# Patient Record
Sex: Male | Born: 1965
Health system: Southern US, Community
[De-identification: ages and names within clinical notes are randomized; demographics above are authoritative.]

## PROBLEM LIST (undated history)

## (undated) DIAGNOSIS — C61 Malignant neoplasm of prostate: Secondary | ICD-10-CM

## (undated) DIAGNOSIS — J449 Chronic obstructive pulmonary disease, unspecified: Secondary | ICD-10-CM

## (undated) DIAGNOSIS — I10 Essential (primary) hypertension: Secondary | ICD-10-CM

## (undated) DIAGNOSIS — F1910 Other psychoactive substance abuse, uncomplicated: Secondary | ICD-10-CM

## (undated) HISTORY — DX: Chronic obstructive pulmonary disease, unspecified: J44.9

## (undated) HISTORY — PX: NO PAST SURGERIES: SHX2092

## (undated) HISTORY — PX: PROSTATE BIOPSY: SHX241

---

## 2002-07-18 ENCOUNTER — Emergency Department (HOSPITAL_COMMUNITY): Admission: EM | Admit: 2002-07-18 | Discharge: 2002-07-18 | Payer: Self-pay | Admitting: Emergency Medicine

## 2002-07-21 ENCOUNTER — Emergency Department (HOSPITAL_COMMUNITY): Admission: EM | Admit: 2002-07-21 | Discharge: 2002-07-21 | Payer: Self-pay | Admitting: Emergency Medicine

## 2005-02-14 ENCOUNTER — Emergency Department (HOSPITAL_COMMUNITY): Admission: EM | Admit: 2005-02-14 | Discharge: 2005-02-14 | Payer: Self-pay | Admitting: Emergency Medicine

## 2005-11-03 ENCOUNTER — Emergency Department (HOSPITAL_COMMUNITY): Admission: EM | Admit: 2005-11-03 | Discharge: 2005-11-04 | Payer: Self-pay | Admitting: Emergency Medicine

## 2009-01-05 ENCOUNTER — Encounter: Admission: RE | Admit: 2009-01-05 | Discharge: 2009-01-05 | Payer: Self-pay | Admitting: Family Medicine

## 2009-11-17 ENCOUNTER — Emergency Department (HOSPITAL_COMMUNITY): Admission: EM | Admit: 2009-11-17 | Discharge: 2009-11-17 | Payer: Self-pay | Admitting: Family Medicine

## 2010-02-02 ENCOUNTER — Ambulatory Visit: Payer: Self-pay | Admitting: Internal Medicine

## 2010-03-02 ENCOUNTER — Ambulatory Visit: Payer: Self-pay | Admitting: Internal Medicine

## 2010-03-02 ENCOUNTER — Encounter (INDEPENDENT_AMBULATORY_CARE_PROVIDER_SITE_OTHER): Payer: Self-pay | Admitting: Family Medicine

## 2010-03-02 LAB — CONVERTED CEMR LAB
Albumin: 4.3 g/dL (ref 3.5–5.2)
Alkaline Phosphatase: 60 units/L (ref 39–117)
Basophils Relative: 0 % (ref 0–1)
Calcium: 9.8 mg/dL (ref 8.4–10.5)
Eosinophils Absolute: 0.1 10*3/uL (ref 0.0–0.7)
Eosinophils Relative: 2 % (ref 0–5)
Glucose, Bld: 90 mg/dL (ref 70–99)
HCT: 50.4 % (ref 39.0–52.0)
HDL: 51 mg/dL (ref >39)
Hemoglobin: 16.3 g/dL (ref 13.0–17.0)
LDL Cholesterol: 137 mg/dL — ABNORMAL HIGH (ref 0–99)
Lymphocytes Relative: 31 % (ref 12–46)
Lymphs Abs: 2.7 10*3/uL (ref 0.7–4.0)
MCHC: 32.3 g/dL (ref 30.0–36.0)
MCV: 93.7 fL (ref 78.0–100.0)
Monocytes Absolute: 0.8 10*3/uL (ref 0.1–1.0)
Monocytes Relative: 9 % (ref 3–12)
Neutrophils Relative %: 59 % (ref 43–77)
Platelets: 274 10*3/uL (ref 150–400)
Potassium: 4.2 meq/L (ref 3.5–5.3)
RDW: 13.9 % (ref 11.5–15.5)
TSH: 0.869 microintl units/mL (ref 0.350–4.500)
Triglycerides: 149 mg/dL (ref <150)

## 2010-05-30 ENCOUNTER — Ambulatory Visit: Payer: Self-pay | Admitting: Internal Medicine

## 2013-06-24 ENCOUNTER — Ambulatory Visit
Admission: RE | Admit: 2013-06-24 | Discharge: 2013-06-24 | Disposition: A | Discharge status: Home / Self Care | Payer: No Typology Code available for payment source | Source: Ambulatory Visit | Attending: Family Medicine | Admitting: Family Medicine

## 2013-06-24 ENCOUNTER — Other Ambulatory Visit: Payer: Self-pay | Admitting: Family Medicine

## 2013-06-24 DIAGNOSIS — I10 Essential (primary) hypertension: Secondary | ICD-10-CM

## 2014-03-05 ENCOUNTER — Other Ambulatory Visit (HOSPITAL_COMMUNITY): Payer: Self-pay

## 2014-03-05 DIAGNOSIS — R0689 Other abnormalities of breathing: Secondary | ICD-10-CM

## 2014-03-10 ENCOUNTER — Encounter (HOSPITAL_COMMUNITY): Payer: Self-pay

## 2014-05-12 ENCOUNTER — Ambulatory Visit (HOSPITAL_COMMUNITY)
Admission: RE | Admit: 2014-05-12 | Discharge: 2014-05-12 | Disposition: A | Discharge status: Home / Self Care | Payer: BC Managed Care – PPO | Source: Ambulatory Visit | Attending: Internal Medicine | Admitting: Internal Medicine

## 2014-05-12 DIAGNOSIS — Z09 Encounter for follow-up examination after completed treatment for conditions other than malignant neoplasm: Secondary | ICD-10-CM | POA: Insufficient documentation

## 2014-05-12 MED ORDER — ALBUTEROL SULFATE (2.5 MG/3ML) 0.083% IN NEBU
2.5000 mg | INHALATION_SOLUTION | Freq: Once | RESPIRATORY_TRACT | Status: AC
  Administered 2014-05-12: 2.5 mg via RESPIRATORY_TRACT

## 2014-05-13 LAB — PULMONARY FUNCTION TEST
DL/VA % pred: 100 %
DL/VA: 4.74 ml/min/mmHg/L
DLCO COR: 38.07 ml/min/mmHg
DLCO UNC % PRED: 113 %
DLCO UNC: 38.07 ml/min/mmHg
DLCO cor % pred: 113 %
FEF 25-75 POST: 3.21 L/s
FEF 25-75 PRE: 2.51 L/s
FEF2575-%CHANGE-POST: 27 %
FEF2575-%PRED-POST: 87 %
FEF2575-%Pred-Pre: 68 %
FEV1-%Change-Post: 8 %
FEV1-%Pred-Post: 90 %
FEV1-%Pred-Pre: 83 %
FEV1-POST: 3.74 L
FEV1-Pre: 3.45 L
FEV1FVC-%CHANGE-POST: 0 %
FEV1FVC-%PRED-PRE: 87 %
FEV6-%CHANGE-POST: 8 %
FEV6-%PRED-POST: 105 %
FEV6-%PRED-PRE: 97 %
FEV6-POST: 5.41 L
FEV6-PRE: 4.97 L
FEV6FVC-%Change-Post: 1 %
FEV6FVC-%Pred-Post: 102 %
FEV6FVC-%Pred-Pre: 101 %
FVC-%Change-Post: 7 %
FVC-%PRED-POST: 102 %
FVC-%Pred-Pre: 95 %
FVC-POST: 5.43 L
FVC-Pre: 5.04 L
POST FEV1/FVC RATIO: 69 %
POST FEV6/FVC RATIO: 100 %
PRE FEV1/FVC RATIO: 68 %
Pre FEV6/FVC Ratio: 99 %
RV % PRED: 128 %
RV: 2.63 L
TLC % PRED: 112 %
TLC: 8.07 L

## 2014-05-27 ENCOUNTER — Other Ambulatory Visit: Payer: Self-pay | Admitting: Urology

## 2014-07-01 ENCOUNTER — Encounter (HOSPITAL_COMMUNITY): Payer: Self-pay

## 2014-07-01 ENCOUNTER — Ambulatory Visit (HOSPITAL_COMMUNITY): Admit: 2014-07-01 | Payer: BC Managed Care – PPO | Admitting: Urology

## 2014-07-01 SURGERY — ROBOTIC ASSISTED LAPAROSCOPIC RADICAL PROSTATECTOMY
Anesthesia: General

## 2015-03-08 ENCOUNTER — Emergency Department (HOSPITAL_COMMUNITY)
Admission: EM | Admit: 2015-03-08 | Discharge: 2015-03-08 | Disposition: A | Discharge status: Home / Self Care | Payer: Self-pay | Attending: Emergency Medicine | Admitting: Emergency Medicine

## 2015-03-08 ENCOUNTER — Encounter (HOSPITAL_COMMUNITY): Payer: Self-pay | Admitting: Emergency Medicine

## 2015-03-08 DIAGNOSIS — T7840XA Allergy, unspecified, initial encounter: Secondary | ICD-10-CM

## 2015-03-08 DIAGNOSIS — R220 Localized swelling, mass and lump, head: Secondary | ICD-10-CM | POA: Insufficient documentation

## 2015-03-08 DIAGNOSIS — I10 Essential (primary) hypertension: Secondary | ICD-10-CM | POA: Insufficient documentation

## 2015-03-08 DIAGNOSIS — T781XXA Other adverse food reactions, not elsewhere classified, initial encounter: Secondary | ICD-10-CM | POA: Insufficient documentation

## 2015-03-08 DIAGNOSIS — Y9389 Activity, other specified: Secondary | ICD-10-CM | POA: Insufficient documentation

## 2015-03-08 DIAGNOSIS — R062 Wheezing: Secondary | ICD-10-CM | POA: Insufficient documentation

## 2015-03-08 DIAGNOSIS — X58XXXA Exposure to other specified factors, initial encounter: Secondary | ICD-10-CM | POA: Insufficient documentation

## 2015-03-08 DIAGNOSIS — Y9289 Other specified places as the place of occurrence of the external cause: Secondary | ICD-10-CM | POA: Insufficient documentation

## 2015-03-08 DIAGNOSIS — Y998 Other external cause status: Secondary | ICD-10-CM | POA: Insufficient documentation

## 2015-03-08 DIAGNOSIS — Z720 Tobacco use: Secondary | ICD-10-CM | POA: Insufficient documentation

## 2015-03-08 HISTORY — DX: Essential (primary) hypertension: I10

## 2015-03-08 LAB — CBC WITH DIFFERENTIAL/PLATELET
BASOS ABS: 0 10*3/uL (ref 0.0–0.1)
BASOS PCT: 0 % (ref 0–1)
EOS ABS: 0.1 10*3/uL (ref 0.0–0.7)
Eosinophils Relative: 1 % (ref 0–5)
HCT: 49.6 % (ref 39.0–52.0)
Hemoglobin: 16.8 g/dL (ref 13.0–17.0)
LYMPHS ABS: 2.8 10*3/uL (ref 0.7–4.0)
Lymphocytes Relative: 28 % (ref 12–46)
MCH: 30.5 pg (ref 26.0–34.0)
MCHC: 33.9 g/dL (ref 30.0–36.0)
MCV: 90 fL (ref 78.0–100.0)
Monocytes Absolute: 1.3 10*3/uL — ABNORMAL HIGH (ref 0.1–1.0)
Monocytes Relative: 12 % (ref 3–12)
NEUTROS ABS: 5.9 10*3/uL (ref 1.7–7.7)
NEUTROS PCT: 59 % (ref 43–77)
PLATELETS: 277 10*3/uL (ref 150–400)
RBC: 5.51 MIL/uL (ref 4.22–5.81)
RDW: 12.9 % (ref 11.5–15.5)
WBC: 10.1 10*3/uL (ref 4.0–10.5)

## 2015-03-08 LAB — BASIC METABOLIC PANEL
ANION GAP: 9 (ref 5–15)
BUN: 10 mg/dL (ref 6–20)
CO2: 22 mmol/L (ref 22–32)
CREATININE: 1.21 mg/dL (ref 0.61–1.24)
Calcium: 9.4 mg/dL (ref 8.9–10.3)
Chloride: 104 mmol/L (ref 101–111)
GFR calc Af Amer: >60 mL/min (ref >60)
GFR calc non Af Amer: >60 mL/min (ref >60)
GLUCOSE: 103 mg/dL — AB (ref 70–99)
POTASSIUM: 3.6 mmol/L (ref 3.5–5.1)
SODIUM: 135 mmol/L (ref 135–145)

## 2015-03-08 MED ORDER — PREDNISONE 50 MG PO TABS: ORAL_TABLET | ORAL | Status: DC

## 2015-03-08 MED ORDER — METHYLPREDNISOLONE SODIUM SUCC 125 MG IJ SOLR
125.0000 mg | Freq: Once | INTRAMUSCULAR | Status: AC
  Administered 2015-03-08: 125 mg via INTRAVENOUS
  Filled 2015-03-08: qty 2

## 2015-03-08 MED ORDER — EPINEPHRINE 0.3 MG/0.3ML IJ SOAJ: 0.3000 mg | Freq: Once | INTRAMUSCULAR | Status: DC | PRN

## 2015-03-08 MED ORDER — FAMOTIDINE 20 MG PO TABS: 20.0000 mg | ORAL_TABLET | Freq: Two times a day (BID) | ORAL | Status: DC

## 2015-03-08 MED ORDER — EPINEPHRINE 0.3 MG/0.3ML IJ SOAJ
0.3000 mg | Freq: Once | INTRAMUSCULAR | Status: AC
  Administered 2015-03-08: 0.3 mg via INTRAMUSCULAR
  Filled 2015-03-08: qty 0.3

## 2015-03-08 MED ORDER — DIPHENHYDRAMINE HCL 25 MG PO TABS: 25.0000 mg | ORAL_TABLET | Freq: Four times a day (QID) | ORAL | Status: DC

## 2015-03-08 MED ORDER — DIPHENHYDRAMINE HCL 50 MG/ML IJ SOLN
25.0000 mg | Freq: Once | INTRAMUSCULAR | Status: AC
  Administered 2015-03-08: 25 mg via INTRAVENOUS
  Filled 2015-03-08: qty 1

## 2015-03-08 MED ORDER — LIDOCAINE-EPINEPHRINE 2 %-1:100000 IJ SOLN: 20.0000 mL | Freq: Once | INTRAMUSCULAR | Status: DC

## 2015-03-08 MED ORDER — FAMOTIDINE IN NACL 20-0.9 MG/50ML-% IV SOLN
20.0000 mg | Freq: Once | INTRAVENOUS | Status: AC
  Administered 2015-03-08: 20 mg via INTRAVENOUS
  Filled 2015-03-08: qty 50

## 2015-03-08 NOTE — Discharge Instructions (Signed)
Read the information below.  Use the prescribed medication as directed.  Please discuss all new medications with your pharmacist.  You may return to the Emergency Department at any time for worsening condition or any new symptoms that concern you.   If you develop high fevers, difficulty swallowing or breathing, or you are unable to tolerate fluids by mouth, return to the ER immediately for a recheck.     STOP taking your blood pressure medication called Lisinopril.  Monitor your blood pressure closely and follow up with your primary care provider for a recheck.      Allergies Allergies may happen from anything your body is sensitive to. This may be food, medicines, pollens, chemicals, and nearly anything around you in everyday life that produces allergens. An allergen is anything that causes an allergy producing substance. Heredity is often a factor in causing these problems. This means you may have some of the same allergies as your parents. Food allergies happen in all age groups. Food allergies are some of the most severe and life threatening. Some common food allergies are cow's milk, seafood, eggs, nuts, wheat, and soybeans. SYMPTOMS   Swelling around the mouth.  An itchy red rash or hives.  Vomiting or diarrhea.  Difficulty breathing. SEVERE ALLERGIC REACTIONS ARE LIFE-THREATENING. This reaction is called anaphylaxis. It can cause the mouth and throat to swell and cause difficulty with breathing and swallowing. In severe reactions only a trace amount of food (for example, peanut oil in a salad) may cause death within seconds. Seasonal allergies occur in all age groups. These are seasonal because they usually occur during the same season every year. They may be a reaction to molds, grass pollens, or tree pollens. Other causes of problems are house dust mite allergens, pet dander, and mold spores. The symptoms often consist of nasal congestion, a runny itchy nose associated with sneezing,  and tearing itchy eyes. There is often an associated itching of the mouth and ears. The problems happen when you come in contact with pollens and other allergens. Allergens are the particles in the air that the body reacts to with an allergic reaction. This causes you to release allergic antibodies. Through a chain of events, these eventually cause you to release histamine into the blood stream. Although it is meant to be protective to the body, it is this release that causes your discomfort. This is why you were given anti-histamines to feel better. If you are unable to pinpoint the offending allergen, it may be determined by skin or blood testing. Allergies cannot be cured but can be controlled with medicine. Hay fever is a collection of all or some of the seasonal allergy problems. It may often be treated with simple over-the-counter medicine such as diphenhydramine. Take medicine as directed. Do not drink alcohol or drive while taking this medicine. Check with your caregiver or package insert for child dosages. If these medicines are not effective, there are many new medicines your caregiver can prescribe. Stronger medicine such as nasal spray, eye drops, and corticosteroids may be used if the first things you try do not work well. Other treatments such as immunotherapy or desensitizing injections can be used if all else fails. Follow up with your caregiver if problems continue. These seasonal allergies are usually not life threatening. They are generally more of a nuisance that can often be handled using medicine. HOME CARE INSTRUCTIONS   If unsure what causes a reaction, keep a diary of foods eaten and symptoms that  follow. Avoid foods that cause reactions.  If hives or rash are present:  Take medicine as directed.  You may use an over-the-counter antihistamine (diphenhydramine) for hives and itching as needed.  Apply cold compresses (cloths) to the skin or take baths in cool water. Avoid hot  baths or showers. Heat will make a rash and itching worse.  If you are severely allergic:  Following a treatment for a severe reaction, hospitalization is often required for closer follow-up.  Wear a medic-alert bracelet or necklace stating the allergy.  You and your family must learn how to give adrenaline or use an anaphylaxis kit.  If you have had a severe reaction, always carry your anaphylaxis kit or EpiPen with you. Use this medicine as directed by your caregiver if a severe reaction is occurring. Failure to do so could have a fatal outcome. SEEK MEDICAL CARE IF:  You suspect a food allergy. Symptoms generally happen within 30 minutes of eating a food.  Your symptoms have not gone away within 2 days or are getting worse.  You develop new symptoms.  You want to retest yourself or your child with a food or drink you think causes an allergic reaction. Never do this if an anaphylactic reaction to that food or drink has happened before. Only do this under the care of a caregiver. SEEK IMMEDIATE MEDICAL CARE IF:   You have difficulty breathing, are wheezing, or have a tight feeling in your chest or throat.  You have a swollen mouth, or you have hives, swelling, or itching all over your body.  You have had a severe reaction that has responded to your anaphylaxis kit or an EpiPen. These reactions may return when the medicine has worn off. These reactions should be considered life threatening. MAKE SURE YOU:   Understand these instructions.  Will watch your condition.  Will get help right away if you are not doing well or get worse. Document Released: 01/09/2003 Document Revised: 02/10/2013 Document Reviewed: 06/15/2008 [facility] Patient Information 2015 [place], [place]. This information is not intended to replace advice given to you by your health care provider. Make sure you discuss any questions you have with your health care provider.

## 2015-03-08 NOTE — ED Provider Notes (Signed)
49 year old male, currently taking antihypertensive medications, presents with acute onset of urticaria, itching and tongue swelling that occurred this morning. He is unsure what the allergic exposure was, he is unsure if he is on an ACE inhibitor, he has had a possible reaction to seafood, possible reaction to antibiotic in the past though he is unsure exactly which medicines or foods it was. On exam the patient has no more urticaria after he was given intramuscular epinephrine, he has a slight amount of tongue angioedema, no tongue extrusion, no lip swelling, no change in phonation, normal lung sounds. No obvious rash on exam. Will obtain pharmacy medication specifics, continue to treat acute allergic reaction, less likely to be ACE inhibitor as this appears to be histamine related with itching and urticaria. The patient will definitely need outpatient allergy testing.  Medical screening examination/treatment/procedure(s) were conducted as a shared visit with non-physician practitioner(s) and myself.  I personally evaluated the patient during the encounter.  Clinical Impression:   Final diagnoses:  Allergic reaction, initial encounter         Noemi Chapel, MD 03/09/15 [phone]

## 2015-03-08 NOTE — ED Provider Notes (Signed)
CSN: [id number]     Arrival date & time 03/08/15  9379 History   First MD Initiated Contact with Patient 03/08/15 [phone]     Chief Complaint  Patient presents with  . Allergic Reaction     (Consider location/radiation/quality/duration/timing/severity/associated sxs/prior Treatment) The history is provided by the patient.     Patient presents with diffuse itching of the hands and body, tongue swelling, mild edema around the eyes that began approximately 1 hour prior to arrival.  Is having to work to swallow but denies SOB.  States he "stays congested" in his lungs and admits to smoking.  Has no known allergies but has had allergic reaction in the past to dental medication that he took on the same day he ate seafood.  He did eat at someone else's house yesterday and had foods that were abnormal for him including quiche, lasagna - no known seafood in the food.  Denies allergy to eggs.    Past Medical History  Diagnosis Date  . Hypertension    History reviewed. No pertinent past surgical history. History reviewed. No pertinent family history. History  Substance Use Topics  . Smoking status: Current Every Day Smoker -- 1.00 packs/day    Types: Cigarettes  . Smokeless tobacco: Not on file  . Alcohol Use: Yes     Comment: 6 beer/daqy    Review of Systems  All other systems reviewed and are negative.     Allergies  Review of patient's allergies indicates no known allergies.  Home Medications   Prior to Admission medications   Not on File   BP 137/85 mmHg  Pulse 81  Temp(Src) 98.3 F (36.8 C) (Oral)  Resp 18  Ht 6\' 1"  (1.854 m)  Wt 230 lb (104.327 kg)  BMI 30.35 kg/m2  SpO2 95% Physical Exam  Constitutional: He appears well-developed and well-nourished. No distress.  HENT:  Head: Normocephalic and atraumatic.  +tongue swelling.  Oropharynx patent.  No labial swelling.  Mild edema around left eye.    Neck: Neck supple.  Cardiovascular: Normal rate and regular rhythm.    Pulmonary/Chest: Effort normal. No respiratory distress. He has wheezes. He has no rales.  Abdominal: Soft.  Neurological: He is alert. He exhibits normal muscle tone.  Skin: He is not diaphoretic.  Redness over dorsal hands.   Nursing note and vitals reviewed.   ED Course  Procedures (including critical care time) Labs Review Labs Reviewed  BASIC METABOLIC PANEL - Abnormal; Notable for the following:    Glucose, Bld 103 (*)    All other components within normal limits  CBC WITH DIFFERENTIAL/PLATELET - Abnormal; Notable for the following:    Monocytes Absolute 1.3 (*)    All other components within normal limits    Imaging Review No results found.   EKG Interpretation None       9:55 AM Allergic reaction vs angioedema.  Tongue swelling, working to swallow.  Some mild wheezing on exam, denies SOB.  Pt on blood pressure medications, multiple, unknown names.  Exposure to new/different foods yesterday.  Has had allergic reaction previously to unknown dental medications and/or seafood years ago.    10:10 AM Dr Sabra Heck made aware of patient.    11:10 AM Pt feeling much better, improved swelling of tongue, no airway concerns at present.  Will continue to monitor.   12:41 PM Pt reports he is back to baseline, no swelling.    MDM   Final diagnoses:  Allergic reaction, initial encounter   Afebrile,  nontoxic patient with diffuse itching followed by tongue swelling.  IM epinephrine, IV solumedrol, benadryl, solumedrol given with great improvement.  Pt monitored >4 hours is complete resolution of symptoms and no rebound.  Unclear if angioedema or more likely allergic reaction given the itching prior to tongue swelling.   D/C home with allergist follow up, d/c lisinopril, add prednisone, pepcid, benadryl, epipen.  Discussed use of epipen and need for 911/ER visit following use.  Discussed result, findings, treatment, and follow up  with patient.  Pt given return precautions.  Pt  verbalizes understanding and agrees with plan.         Clayton Bibles, PA-C 03/08/15 1557  Noemi Chapel, MD 03/09/15 [phone]

## 2015-03-08 NOTE — ED Notes (Signed)
Pt reports noting itching to hands and tounge swelling beginning this AM. Pt slurring speech. Airway intact.

## 2015-03-08 NOTE — ED Notes (Signed)
Pt complaining of pain at IV site, removed by this RN per pt request.

## 2017-03-05 ENCOUNTER — Encounter (HOSPITAL_COMMUNITY): Payer: Self-pay | Admitting: Emergency Medicine

## 2017-03-05 ENCOUNTER — Emergency Department (HOSPITAL_COMMUNITY)
Admission: EM | Admit: 2017-03-05 | Discharge: 2017-03-06 | Discharge status: Against Medical Advice | Payer: Self-pay | Attending: Emergency Medicine | Admitting: Emergency Medicine

## 2017-03-05 ENCOUNTER — Emergency Department (HOSPITAL_COMMUNITY): Payer: Self-pay

## 2017-03-05 DIAGNOSIS — F17210 Nicotine dependence, cigarettes, uncomplicated: Secondary | ICD-10-CM | POA: Insufficient documentation

## 2017-03-05 DIAGNOSIS — F1910 Other psychoactive substance abuse, uncomplicated: Secondary | ICD-10-CM

## 2017-03-05 DIAGNOSIS — R791 Abnormal coagulation profile: Secondary | ICD-10-CM | POA: Insufficient documentation

## 2017-03-05 DIAGNOSIS — Z79899 Other long term (current) drug therapy: Secondary | ICD-10-CM | POA: Insufficient documentation

## 2017-03-05 DIAGNOSIS — R509 Fever, unspecified: Secondary | ICD-10-CM

## 2017-03-05 DIAGNOSIS — M6282 Rhabdomyolysis: Secondary | ICD-10-CM

## 2017-03-05 DIAGNOSIS — R4182 Altered mental status, unspecified: Secondary | ICD-10-CM | POA: Insufficient documentation

## 2017-03-05 DIAGNOSIS — I10 Essential (primary) hypertension: Secondary | ICD-10-CM | POA: Insufficient documentation

## 2017-03-05 LAB — CBC WITH DIFFERENTIAL/PLATELET
Basophils Absolute: 0 10*3/uL (ref 0.0–0.1)
Basophils Relative: 0 %
Eosinophils Absolute: 0 10*3/uL (ref 0.0–0.7)
Eosinophils Relative: 0 %
HEMATOCRIT: 46.9 % (ref 39.0–52.0)
HEMOGLOBIN: 15.7 g/dL (ref 13.0–17.0)
LYMPHS PCT: 11 %
Lymphs Abs: 2.2 10*3/uL (ref 0.7–4.0)
MCH: 30.3 pg (ref 26.0–34.0)
MCHC: 33.5 g/dL (ref 30.0–36.0)
MCV: 90.4 fL (ref 78.0–100.0)
MONO ABS: 1.2 10*3/uL — AB (ref 0.1–1.0)
MONOS PCT: 6 %
NEUTROS ABS: 17 10*3/uL — AB (ref 1.7–7.7)
NEUTROS PCT: 83 %
Platelets: 269 10*3/uL (ref 150–400)
RBC: 5.19 MIL/uL (ref 4.22–5.81)
RDW: 13.9 % (ref 11.5–15.5)
WBC: 20.5 10*3/uL — ABNORMAL HIGH (ref 4.0–10.5)

## 2017-03-05 LAB — I-STAT TROPONIN, ED: TROPONIN I, POC: 0.04 ng/mL (ref 0.00–0.08)

## 2017-03-05 LAB — PROTIME-INR
INR: 1.15
Prothrombin Time: 14.8 seconds (ref 11.4–15.2)

## 2017-03-05 MED ORDER — SODIUM CHLORIDE 0.9 % IV BOLUS (SEPSIS)
1000.0000 mL | Freq: Once | INTRAVENOUS | Status: AC
  Administered 2017-03-06: 1000 mL via INTRAVENOUS

## 2017-03-05 NOTE — ED Notes (Signed)
Pt sleeping in room unresponsive.  Pt's VSS.  No attempt to arouse pt due to his previous violent behavior.

## 2017-03-05 NOTE — ED Notes (Signed)
Patient transported to CT 

## 2017-03-05 NOTE — ED Triage Notes (Signed)
Per EMS:   Pt went to a party and drove back to his house.  Pt then smoke some substance and started to become aggressive and acting altered to family. Family called EMS, pt was then aggressive to EMS.  Pt hit arm against pole and showed no sign of feeling pain.  Pt given 5 of Haldol and 5 of Versed.  Pt currently sedated.   VSS

## 2017-03-05 NOTE — ED Provider Notes (Signed)
[facility] DEPT Provider Note   CSN: [id number] Arrival date & time: 03/05/17  2219 By signing my name below, I, Dyke Brackett, attest that this documentation has been prepared under the direction and in the presence of Danicka Hourihan, Annie Main, MD . Electronically Signed: Dyke Brackett, Scribe. 03/05/2017. 11:15 PM.   History   Chief Complaint Chief Complaint  Patient presents with  . Altered Mental Status  LEVEL 5 CAVEAT DUE TO ALTERED MENTAL STATUS   HPI Roy Rosales is a 51 y.o. male with a history of HTN who presents to the Emergency for evaluation of altered mental status. Per EMS: pt went to a party this evening and then drove to his home. He then smoked an unknown substace and started acting aggressively towards his family who in turn called EMS. Pt then became aggressive towards them. Pt was given Haldol and Versed PTA. Pt is currently sedated.   Recheck 1 am Pt states "someone gave me some PCP" which he smoked. Pt denies any cardiac or lung history. He denies any HA, neck pain, or any other pain. No recent illness.  Pt also endorses marijuana use tonight. He denies any regular alcohol use or any IV drug use.  The history is provided by the EMS personnel and medical records. No language interpreter was used.   Past Medical History:  Diagnosis Date  . Hypertension     There are no active problems to display for this patient.   History reviewed. No pertinent surgical history.   Home Medications    Prior to Admission medications   Medication Sig Start Date End Date Taking? Authorizing Provider  atenolol (TENORMIN) 100 MG tablet Take 100 mg by mouth daily.    [provider]  diphenhydrAMINE (BENADRYL) 25 MG tablet Take 1 tablet (25 mg total) by mouth every 6 (six) hours. X 3 days then PRN allergic reaction/itching 03/08/15   West, Emily, PA-C  EPINEPHrine 0.3 mg/0.3 mL IJ SOAJ injection Inject 0.3 mLs (0.3 mg total) into the muscle once as needed (severe allergic  reaction, difficulty swallowing or breathing). 03/08/15   Clayton Bibles, PA-C  famotidine (PEPCID) 20 MG tablet Take 1 tablet (20 mg total) by mouth 2 (two) times daily. X 3 day then PRN allergic reaction 03/08/15   West, Emily, PA-C  fenofibrate (TRICOR) 48 MG tablet Take 48 mg by mouth daily.    [provider]  potassium chloride SA (K-DUR,KLOR-CON) 20 MEQ tablet Take 20 mEq by mouth daily.    [provider]  pravastatin (PRAVACHOL) 20 MG tablet Take 20 mg by mouth daily.    [provider]  predniSONE (DELTASONE) 50 MG tablet Take 1 tab PO daily 03/08/15   West, Emily, PA-C  triamterene-hydrochlorothiazide (MAXZIDE) 75-50 MG per tablet Take 1 tablet by mouth daily.    [provider]    Family History No family history on file.  Social History Social History  Substance Use Topics  . Smoking status: Current Every Day Smoker    Packs/day: 1.00    Types: Cigarettes  . Smokeless tobacco: Never Used  . Alcohol use Yes     Comment: 6 beer/daqy     Allergies   Patient has no known allergies.   Review of Systems Review of Systems  Unable to perform ROS: Mental status change   Physical Exam Updated Vital Signs BP 133/86   Pulse 98   Temp (!) 101.1 F (38.4 C) (Rectal)   Resp 17   Ht 6\' 2"  (1.88  m)   Wt 250 lb (113.4 kg)   SpO2 98%   BMI 32.10 kg/m   Physical Exam  Constitutional: He is oriented to person, place, and time. He appears well-developed and well-nourished. No distress. He is sedated.  Obtunded and chemically sedated. no obvious trauma  HENT:  Head: Normocephalic and atraumatic.  Mouth/Throat: Oropharynx is clear and moist. No oropharyngeal exudate.  Has a gag, protects airway.   Eyes: Conjunctivae and EOM are normal.  Pinpoint pupils  Neck: Normal range of motion. Neck supple.  No meningismus.  Cardiovascular: Normal rate, regular rhythm, normal heart sounds and intact distal pulses.   No murmur heard. Pulmonary/Chest:  Effort normal and breath sounds normal. No respiratory distress.  Abdominal: Soft. There is no tenderness. There is no rebound and no guarding.  Musculoskeletal: Normal range of motion. He exhibits no edema or tenderness.  Neurological: He is alert and oriented to person, place, and time. No cranial nerve deficit. He exhibits normal muscle tone. Coordination normal.  Responds to pain only. Moves all extremities spontaneously   Skin: Skin is warm. He is diaphoretic.  Psychiatric: He has a normal mood and affect. His behavior is normal.  Nursing note and vitals reviewed.   ED Treatments / Results  DIAGNOSTIC STUDIES:  Oxygen Saturation is 96% on RA, normal by my interpretation.     Labs (all labs ordered are listed, but only abnormal results are displayed) Labs Reviewed  CBC WITH DIFFERENTIAL/PLATELET - Abnormal; Notable for the following:       Result Value   WBC 20.5 (*)    Neutro Abs 17.0 (*)    Monocytes Absolute 1.2 (*)    All other components within normal limits  COMPREHENSIVE METABOLIC PANEL - Abnormal; Notable for the following:    Potassium 3.2 (*)    CO2 21 (*)    Glucose, Bld 126 (*)    Creatinine, Ser 1.45 (*)    AST 74 (*)    GFR calc non Af Amer 55 (*)    All other components within normal limits  CK - Abnormal; Notable for the following:    Total CK 2,858 (*)    All other components within normal limits  ACETAMINOPHEN LEVEL - Abnormal; Notable for the following:    Acetaminophen (Tylenol), Serum <10 (*)    All other components within normal limits  RAPID URINE DRUG SCREEN, HOSP PERFORMED - Abnormal; Notable for the following:    Benzodiazepines POSITIVE (*)    Tetrahydrocannabinol POSITIVE (*)    All other components within normal limits  TROPONIN I - Abnormal; Notable for the following:    Troponin I 0.06 (*)    All other components within normal limits  BASIC METABOLIC PANEL - Abnormal; Notable for the following:    Potassium 3.3 (*)    Calcium 7.9 (*)     All other components within normal limits  TROPONIN I - Abnormal; Notable for the following:    Troponin I 0.05 (*)    All other components within normal limits  CK - Abnormal; Notable for the following:    Total CK 3,174 (*)    All other components within normal limits  CULTURE, BLOOD (ROUTINE X 2)  CULTURE, BLOOD (ROUTINE X 2)  URINE CULTURE  URINALYSIS, ROUTINE W REFLEX MICROSCOPIC  ETHANOL  SALICYLATE LEVEL  TSH  AMMONIA  PROTIME-INR  I-STAT CG4 LACTIC ACID, ED  I-STAT TROPOININ, ED  I-STAT CG4 LACTIC ACID, ED    EKG  EKG Interpretation  Date/Time:  Monday Mar 05 2017 22:41:42 EDT Ventricular Rate:  99 PR Interval:    QRS Duration: 77 QT Interval:  355 QTC Calculation: 456 R Axis:   42 Text Interpretation:  Sinus rhythm Posterior infarct, old Baseline wander in lead(s) II III aVF similar ST elevation in 2016 Confirmed by Wyvonnia Dusky  MD, [facility] [phone]) on 03/05/2017 11:26:42 PM       Radiology Dg Chest 1 View  Result Date: 03/06/2017 CLINICAL DATA:  Acute onset of altered mental status. Patient became aggressive. Initial encounter. EXAM: CHEST 1 VIEW COMPARISON:  Chest radiograph performed 06/24/2013 FINDINGS: The lungs are well-aerated and clear. There is no evidence of focal opacification, pleural effusion or pneumothorax. The cardiomediastinal silhouette is within normal limits. No acute osseous abnormalities are seen. IMPRESSION: No acute cardiopulmonary process seen. Electronically Signed   By: Garald Balding M.D.   On: 03/06/2017 00:01   Ct Head Wo Contrast  Result Date: 03/05/2017 CLINICAL DATA:  Altered mental status. EXAM: CT HEAD WITHOUT CONTRAST TECHNIQUE: Contiguous axial images were obtained from the base of the skull through the vertex without intravenous contrast. COMPARISON:  None. FINDINGS: Brain: No evidence of acute infarction, hemorrhage, hydrocephalus, extra-axial collection or mass lesion/mass effect. Vascular: No hyperdense vessel or unexpected  calcification. Skull: Normal. Negative for fracture or focal lesion. Sinuses/Orbits: Mild mucosal thickening of right side of sphenoid sinus. No fluid levels. Left mastoid air cells are hypo pneumatized. Visualized orbits are unremarkable. Other: None. IMPRESSION: No acute intracranial abnormality. Electronically Signed   By: Jeb Levering M.D.   On: 03/05/2017 23:43    Procedures Procedures (including critical care time)  Medications Ordered in ED Medications  sodium chloride 0.9 % bolus 1,000 mL (1,000 mLs Intravenous New Bag/Given 03/06/17 0013)  sodium chloride 0.9 % bolus 1,000 mL (not administered)  sodium chloride 0.9 % bolus 1,000 mL (not administered)     Initial Impression / Assessment and Plan / ED Course  I have reviewed the triage vital signs and the nursing notes.  Pertinent labs & imaging results that were available during my care of the patient were reviewed by me and considered in my medical decision making (see chart for details).     Patient presents by EMS with altered mental status. Was apparently using some kind of drugs and became aggressive and had to be sedated by EMS. He is obtunded and diaphoretic on initial assessment moving all extremities without following commands. He is febrile.  He is protecting airway.  IVF given.  Labs obtained including cultures.  CT head negative.  CXR and UA negative for infection Labs with AKI, minimal troponin elevation, CK 2800. pAtient aggressively hydrated.  About 1 am, patient is awake and alert, able to give a history.  Denies any complaint.  Alert and oriented x3  Denies SI or HI. D/w patient elevated CK and need for additional IV fluid.  Patient left the ED approximately 4 AM after deciding that he was going outside to smoke. He refused to stop and walked out of the department. He removed his own IV. He was alert and oriented 4. No suicidal or homicidal thoughts. Patient could not be stopped from leaving and will leave  [place].  As previously explained to the patient, his rhabdomyolysis could cause kidney damage which could be permanent. He received 4 L of fluid while in the ED. Repeat CK and chemistry were still pending. Patient left AMA. Appeared to have capacity to make medical decisions.  Final Clinical  Impressions(s) / ED Diagnoses   Final diagnoses:  Polysubstance abuse  Non-traumatic rhabdomyolysis  Fever, unspecified    New Prescriptions New Prescriptions   No medications on file   I personally performed the services described in this documentation, which was scribed in my presence. The recorded information has been reviewed and is accurate.    Ezequiel Essex, MD 03/06/17 1700

## 2017-03-06 LAB — CK
Total CK: 2858 U/L — ABNORMAL HIGH (ref 49–397)
Total CK: 3174 U/L — ABNORMAL HIGH (ref 49–397)

## 2017-03-06 LAB — COMPREHENSIVE METABOLIC PANEL
ALBUMIN: 4 g/dL (ref 3.5–5.0)
ALT: 51 U/L (ref 17–63)
ANION GAP: 15 (ref 5–15)
AST: 74 U/L — ABNORMAL HIGH (ref 15–41)
Alkaline Phosphatase: 57 U/L (ref 38–126)
BILIRUBIN TOTAL: 1 mg/dL (ref 0.3–1.2)
BUN: 10 mg/dL (ref 6–20)
CALCIUM: 10 mg/dL (ref 8.9–10.3)
CO2: 21 mmol/L — ABNORMAL LOW (ref 22–32)
Chloride: 102 mmol/L (ref 101–111)
Creatinine, Ser: 1.45 mg/dL — ABNORMAL HIGH (ref 0.61–1.24)
GFR calc non Af Amer: 55 mL/min — ABNORMAL LOW (ref >60)
Glucose, Bld: 126 mg/dL — ABNORMAL HIGH (ref 65–99)
POTASSIUM: 3.2 mmol/L — AB (ref 3.5–5.1)
Sodium: 138 mmol/L (ref 135–145)
TOTAL PROTEIN: 7.6 g/dL (ref 6.5–8.1)

## 2017-03-06 LAB — BASIC METABOLIC PANEL
Anion gap: 8 (ref 5–15)
BUN: 9 mg/dL (ref 6–20)
CHLORIDE: 109 mmol/L (ref 101–111)
CO2: 22 mmol/L (ref 22–32)
Calcium: 7.9 mg/dL — ABNORMAL LOW (ref 8.9–10.3)
Creatinine, Ser: 1.04 mg/dL (ref 0.61–1.24)
GFR calc Af Amer: >60 mL/min (ref >60)
GFR calc non Af Amer: >60 mL/min (ref >60)
Glucose, Bld: 88 mg/dL (ref 65–99)
POTASSIUM: 3.3 mmol/L — AB (ref 3.5–5.1)
SODIUM: 139 mmol/L (ref 135–145)

## 2017-03-06 LAB — RAPID URINE DRUG SCREEN, HOSP PERFORMED
Amphetamines: NOT DETECTED
BARBITURATES: NOT DETECTED
BENZODIAZEPINES: POSITIVE — AB
COCAINE: NOT DETECTED
OPIATES: NOT DETECTED
TETRAHYDROCANNABINOL: POSITIVE — AB

## 2017-03-06 LAB — AMMONIA: Ammonia: 25 umol/L (ref 9–35)

## 2017-03-06 LAB — TSH: TSH: 1.231 u[IU]/mL (ref 0.350–4.500)

## 2017-03-06 LAB — ACETAMINOPHEN LEVEL: Acetaminophen (Tylenol), Serum: <10 ug/mL — ABNORMAL LOW (ref 10–30)

## 2017-03-06 LAB — URINALYSIS, ROUTINE W REFLEX MICROSCOPIC
BILIRUBIN URINE: NEGATIVE
GLUCOSE, UA: NEGATIVE mg/dL
HGB URINE DIPSTICK: NEGATIVE
KETONES UR: NEGATIVE mg/dL
Leukocytes, UA: NEGATIVE
Nitrite: NEGATIVE
PH: 6 (ref 5.0–8.0)
Protein, ur: NEGATIVE mg/dL
SPECIFIC GRAVITY, URINE: 1.005 (ref 1.005–1.030)

## 2017-03-06 LAB — TROPONIN I
TROPONIN I: 0.05 ng/mL — AB (ref <0.03)
Troponin I: 0.06 ng/mL (ref <0.03)

## 2017-03-06 LAB — ETHANOL: Alcohol, Ethyl (B): <5 mg/dL (ref <5)

## 2017-03-06 LAB — I-STAT CG4 LACTIC ACID, ED: LACTIC ACID, VENOUS: 1.66 mmol/L (ref 0.5–1.9)

## 2017-03-06 LAB — SALICYLATE LEVEL: Salicylate Lvl: <7 mg/dL (ref 2.8–30.0)

## 2017-03-06 MED ORDER — SODIUM CHLORIDE 0.9 % IV BOLUS (SEPSIS)
1000.0000 mL | Freq: Once | INTRAVENOUS | Status: AC
Start: 2017-03-06 — End: 2017-03-06
  Administered 2017-03-06: 1000 mL via INTRAVENOUS

## 2017-03-06 MED ORDER — SODIUM CHLORIDE 0.9 % IV BOLUS (SEPSIS)
1000.0000 mL | Freq: Once | INTRAVENOUS | Status: AC
  Administered 2017-03-06: 1000 mL via INTRAVENOUS

## 2017-03-06 NOTE — ED Notes (Signed)
Pt A&Ox4, VSS.  Pt pulled his IV out and decided to go out and have a smoke. RN and ED-P tried to get the pt to stay and he walked away from [place].

## 2017-03-06 NOTE — ED Notes (Signed)
Pt left AMA no addl lab draw

## 2017-03-07 LAB — URINE CULTURE

## 2017-03-08 ENCOUNTER — Encounter (HOSPITAL_COMMUNITY): Payer: Self-pay

## 2017-03-08 ENCOUNTER — Observation Stay (HOSPITAL_COMMUNITY)
Admission: EM | Admit: 2017-03-08 | Discharge: 2017-03-09 | Disposition: A | Discharge status: Home / Self Care | Payer: Self-pay | Attending: Internal Medicine | Admitting: Internal Medicine

## 2017-03-08 ENCOUNTER — Emergency Department (HOSPITAL_COMMUNITY): Payer: Self-pay

## 2017-03-08 DIAGNOSIS — D649 Anemia, unspecified: Secondary | ICD-10-CM | POA: Insufficient documentation

## 2017-03-08 DIAGNOSIS — F17210 Nicotine dependence, cigarettes, uncomplicated: Secondary | ICD-10-CM | POA: Insufficient documentation

## 2017-03-08 DIAGNOSIS — F10229 Alcohol dependence with intoxication, unspecified: Secondary | ICD-10-CM

## 2017-03-08 DIAGNOSIS — M6282 Rhabdomyolysis: Secondary | ICD-10-CM | POA: Diagnosis present

## 2017-03-08 DIAGNOSIS — R7401 Elevation of levels of liver transaminase levels: Secondary | ICD-10-CM | POA: Diagnosis present

## 2017-03-08 DIAGNOSIS — I10 Essential (primary) hypertension: Secondary | ICD-10-CM | POA: Diagnosis present

## 2017-03-08 DIAGNOSIS — Z8546 Personal history of malignant neoplasm of prostate: Secondary | ICD-10-CM | POA: Insufficient documentation

## 2017-03-08 DIAGNOSIS — K921 Melena: Principal | ICD-10-CM | POA: Diagnosis present

## 2017-03-08 DIAGNOSIS — D62 Acute posthemorrhagic anemia: Secondary | ICD-10-CM

## 2017-03-08 DIAGNOSIS — Z79899 Other long term (current) drug therapy: Secondary | ICD-10-CM | POA: Insufficient documentation

## 2017-03-08 DIAGNOSIS — R740 Nonspecific elevation of levels of transaminase and lactic acid dehydrogenase [LDH]: Secondary | ICD-10-CM

## 2017-03-08 DIAGNOSIS — D72829 Elevated white blood cell count, unspecified: Secondary | ICD-10-CM | POA: Diagnosis present

## 2017-03-08 DIAGNOSIS — E871 Hypo-osmolality and hyponatremia: Secondary | ICD-10-CM | POA: Diagnosis present

## 2017-03-08 DIAGNOSIS — F1020 Alcohol dependence, uncomplicated: Secondary | ICD-10-CM | POA: Diagnosis present

## 2017-03-08 HISTORY — DX: Malignant neoplasm of prostate: C61

## 2017-03-08 HISTORY — DX: Other psychoactive substance abuse, uncomplicated: F19.10

## 2017-03-08 LAB — COMPREHENSIVE METABOLIC PANEL
ALT: 76 U/L — ABNORMAL HIGH (ref 17–63)
AST: 131 U/L — ABNORMAL HIGH (ref 15–41)
Albumin: 3.9 g/dL (ref 3.5–5.0)
Alkaline Phosphatase: 47 U/L (ref 38–126)
Anion gap: 8 (ref 5–15)
BILIRUBIN TOTAL: 0.6 mg/dL (ref 0.3–1.2)
BUN: 13 mg/dL (ref 6–20)
CO2: 25 mmol/L (ref 22–32)
CREATININE: 0.85 mg/dL (ref 0.61–1.24)
Calcium: 9.7 mg/dL (ref 8.9–10.3)
Chloride: 100 mmol/L — ABNORMAL LOW (ref 101–111)
GLUCOSE: 106 mg/dL — AB (ref 65–99)
POTASSIUM: 3.6 mmol/L (ref 3.5–5.1)
Sodium: 133 mmol/L — ABNORMAL LOW (ref 135–145)
TOTAL PROTEIN: 7 g/dL (ref 6.5–8.1)

## 2017-03-08 LAB — CBC WITH DIFFERENTIAL/PLATELET
Basophils Absolute: 0 10*3/uL (ref 0.0–0.1)
Basophils Relative: 0 %
EOS PCT: 1 %
Eosinophils Absolute: 0.3 10*3/uL (ref 0.0–0.7)
HEMATOCRIT: 39.2 % (ref 39.0–52.0)
Hemoglobin: 12.8 g/dL — ABNORMAL LOW (ref 13.0–17.0)
LYMPHS ABS: 5.6 10*3/uL — AB (ref 0.7–4.0)
Lymphocytes Relative: 22 %
MCH: 29.9 pg (ref 26.0–34.0)
MCHC: 32.7 g/dL (ref 30.0–36.0)
MCV: 91.6 fL (ref 78.0–100.0)
MONO ABS: 2.8 10*3/uL — AB (ref 0.1–1.0)
Monocytes Relative: 11 %
NEUTROS ABS: 16.8 10*3/uL — AB (ref 1.7–7.7)
Neutrophils Relative %: 66 %
PLATELETS: 275 10*3/uL (ref 150–400)
RBC: 4.28 MIL/uL (ref 4.22–5.81)
RDW: 13.6 % (ref 11.5–15.5)
WBC: 25.5 10*3/uL — AB (ref 4.0–10.5)

## 2017-03-08 LAB — URINALYSIS, ROUTINE W REFLEX MICROSCOPIC
BILIRUBIN URINE: NEGATIVE
GLUCOSE, UA: NEGATIVE mg/dL
HGB URINE DIPSTICK: NEGATIVE
KETONES UR: NEGATIVE mg/dL
Leukocytes, UA: NEGATIVE
Nitrite: NEGATIVE
PROTEIN: NEGATIVE mg/dL
Specific Gravity, Urine: 1.001 — ABNORMAL LOW (ref 1.005–1.030)
pH: 7 (ref 5.0–8.0)

## 2017-03-08 LAB — I-STAT CG4 LACTIC ACID, ED: Lactic Acid, Venous: 1.34 mmol/L (ref 0.5–1.9)

## 2017-03-08 LAB — POC OCCULT BLOOD, ED: Fecal Occult Bld: POSITIVE — AB

## 2017-03-08 MED ORDER — ALBUTEROL (5 MG/ML) CONTINUOUS INHALATION SOLN
10.0000 mg/h | INHALATION_SOLUTION | Freq: Once | RESPIRATORY_TRACT | Status: AC
  Administered 2017-03-08: 10 mg/h via RESPIRATORY_TRACT
  Filled 2017-03-08: qty 20

## 2017-03-08 MED ORDER — IPRATROPIUM BROMIDE 0.02 % IN SOLN
0.5000 mg | Freq: Once | RESPIRATORY_TRACT | Status: AC
  Administered 2017-03-08: 0.5 mg via RESPIRATORY_TRACT
  Filled 2017-03-08: qty 2.5

## 2017-03-08 MED ORDER — PANTOPRAZOLE SODIUM 40 MG IV SOLR
40.0000 mg | Freq: Once | INTRAVENOUS | Status: AC
  Administered 2017-03-08: 40 mg via INTRAVENOUS
  Filled 2017-03-08: qty 40

## 2017-03-08 MED ORDER — SODIUM CHLORIDE 0.9 % IV BOLUS (SEPSIS)
1000.0000 mL | Freq: Once | INTRAVENOUS | Status: AC
  Administered 2017-03-08: 1000 mL via INTRAVENOUS

## 2017-03-08 NOTE — ED Provider Notes (Signed)
[place] DEPT Provider Note   CSN: [id number] Arrival date & time: 03/08/17  1739     History   Chief Complaint Chief Complaint  Patient presents with  . Dehydration    HPI Roy Rosales is a 51 y.o. male.  Patient arrives to the ED with multiple complaints including shortness of breath, cough, intermittent black stools. Patient was seen in the ED several days ago for PCP intoxication and left AGAINST MEDICAL ADVICE in which he was found to have mild rhabdomyolysis. Patient states that since discharge she has had some black stools, cough, shortness of breath. He denies chest pain. He states that his urine is a clear color and has no cramps. Patient denies any IV drug use but admits to daily alcohol and smoking. Patient has no history of ulcers, does not use any ibuprofen or NSAIDs. Patient has never had an EGD or colonoscopy. Patient denies lightheadedness, dizziness.   The history is provided by the patient.  Illness  This is a new problem. The current episode started more than 2 days ago. The problem occurs daily. Associated symptoms include shortness of breath. Pertinent negatives include no chest pain, no abdominal pain and no headaches. Nothing aggravates the symptoms. Nothing relieves the symptoms.    Past Medical History:  Diagnosis Date  . Hypertension   . Polysubstance abuse   . Prostate cancer [facility])     Patient Active Problem List   Diagnosis Date Noted  . Melena 03/08/2017  . 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 Hyponatremia 03/08/2017  . Alcoholism 03/08/2017  . Leukocytosis 03/08/2017  . Transaminitis 03/08/2017  . Essential hypertension 03/08/2017    History reviewed. No pertinent surgical history.     Home Medications    Prior to Admission medications   Medication Sig Start Date End Date Taking? Authorizing Provider  atenolol (TENORMIN) 100 MG tablet Take 100 mg by mouth daily.   Yes [provider]  diphenhydrAMINE (BENADRYL) 25 MG  tablet Take 1 tablet (25 mg total) by mouth every 6 (six) hours. X 3 days then PRN allergic reaction/itching 03/08/15  Yes West, Emily, PA-C  ENSURE PLUS (ENSURE PLUS) LIQD Take 237 mLs by mouth 3 (three) times daily as needed (supplement).   Yes [provider]  EPINEPHrine 0.3 mg/0.3 mL IJ SOAJ injection Inject 0.3 mLs (0.3 mg total) into the muscle once as needed (severe allergic reaction, difficulty swallowing or breathing). 03/08/15  Yes West, Emily, PA-C  famotidine (PEPCID) 20 MG tablet Take 1 tablet (20 mg total) by mouth 2 (two) times daily. X 3 day then PRN allergic reaction 03/08/15  Yes West, Emily, PA-C  triamterene-hydrochlorothiazide (MAXZIDE) 75-50 MG per tablet Take 1 tablet by mouth daily.   Yes [provider]    Family History Family History  Problem Relation Age of Onset  . Breast cancer Mother   . Hypertension Mother   . Cancer Sister     Social History Social History  Substance Use Topics  . Smoking status: Current Every Day Smoker    Packs/day: 1.00    Types: Cigarettes  . Smokeless tobacco: Never Used  . Alcohol use Yes     Comment: 6-12+ beer/daqy     Allergies   Patient has no known allergies.   Review of Systems Review of Systems  Constitutional: Negative for chills and fever.  HENT: Negative for ear pain and sore throat.   Eyes: Negative for pain and visual disturbance.  Respiratory: Positive for shortness of breath and wheezing. Negative  for cough.   Cardiovascular: Negative for chest pain and palpitations.  Gastrointestinal: Positive for blood in stool and nausea. Negative for abdominal distention, abdominal pain, anal bleeding, constipation, diarrhea, rectal pain and vomiting.  Genitourinary: Negative for dysuria and hematuria.  Musculoskeletal: Negative for arthralgias and back pain.  Skin: Negative for color change and rash.  Neurological: Negative for seizures, syncope and headaches.  All other systems reviewed and are  negative.    Physical Exam Updated Vital Signs  ED Triage Vitals  Enc Vitals Group     BP 03/08/17 1820 139/90     Pulse Rate 03/08/17 1820 94     Resp 03/08/17 1820 18     Temp 03/08/17 1820 98.2 F (36.8 C)     Temp Source 03/08/17 1820 Oral     SpO2 03/08/17 1820 97 %     Weight 03/08/17 1818 250 lb (113.4 kg)     Height 03/08/17 1818 6\' 1"  (1.854 m)     Head Circumference --      Peak Flow --      Pain Score --      Pain Loc --      Pain Edu? --      Excl. in [place]? --     Physical Exam  Constitutional: He is oriented to person, place, and time. He appears well-developed and well-nourished.  HENT:  Head: Normocephalic and atraumatic.  Eyes: Conjunctivae are normal. Pupils are equal, round, and reactive to light.  Neck: Normal range of motion. Neck supple.  Cardiovascular: Normal rate and regular rhythm.   No murmur heard. Pulmonary/Chest: Effort normal. No respiratory distress. He has wheezes.  Abdominal: Soft. He exhibits no distension. There is no tenderness.  Genitourinary: Rectal exam shows guaiac positive stool (interdeterminate rectal exam due to no stool sample appeared melanotic).  Musculoskeletal: He exhibits no edema.  Neurological: He is alert and oriented to person, place, and time.  Skin: Skin is warm and dry.  Psychiatric: He has a normal mood and affect.  Nursing note and vitals reviewed.    ED Treatments / Results  Labs (all labs ordered are listed, but only abnormal results are displayed) Labs Reviewed  URINALYSIS, ROUTINE W REFLEX MICROSCOPIC - Abnormal; Notable for the following:       Result Value   Color, Urine COLORLESS (*)    Specific Gravity, Urine 1.001 (*)    All other components within normal limits  COMPREHENSIVE METABOLIC PANEL - Abnormal; Notable for the following:    Sodium 133 (*)    Chloride 100 (*)    Glucose, Bld 106 (*)    AST 131 (*)    ALT 76 (*)    All other components within normal limits  CBC WITH  DIFFERENTIAL/PLATELET - Abnormal; Notable for the following:    WBC 25.5 (*)    Hemoglobin 12.8 (*)    Neutro Abs 16.8 (*)    Lymphs Abs 5.6 (*)    Monocytes Absolute 2.8 (*)    All other components within normal limits  POC OCCULT BLOOD, ED - Abnormal; Notable for the following:    Fecal Occult Bld POSITIVE (*)    All other components within normal limits  CULTURE, BLOOD (ROUTINE X 2)  CULTURE, BLOOD (ROUTINE X 2)  HIV ANTIBODY (ROUTINE TESTING)  CK  CBC  COMPREHENSIVE METABOLIC PANEL  PROTIME-INR  I-STAT CG4 LACTIC ACID, ED  TYPE AND SCREEN  ABO/RH    EKG  EKG Interpretation None  Radiology Dg Chest 2 View  Result Date: 03/08/2017 CLINICAL DATA:  Elevated white cell count, cough, dark urine, night sweats. History of hypertension. EXAM: CHEST  2 VIEW COMPARISON:  03/05/2017 FINDINGS: The heart size and mediastinal contours are within normal limits. Both lungs are clear. The visualized skeletal structures are unremarkable. IMPRESSION: No active cardiopulmonary disease. Electronically Signed   By: Lucienne Capers M.D.   On: 03/08/2017 23:12    Procedures Procedures (including critical care time)  Medications Ordered in ED Medications  atenolol (TENORMIN) tablet 100 mg (100 mg Oral Given 03/09/17 0014)  nicotine (NICODERM CQ - dosed in mg/24 hours) patch 21 mg (not administered)  gabapentin (NEURONTIN) capsule 300 mg (not administered)  LORazepam (ATIVAN) tablet 1 mg (1 mg Oral Given 03/09/17 0019)    Or  LORazepam (ATIVAN) injection 1 mg ( Intravenous See Alternative 03/09/17 0019)  thiamine (VITAMIN B-1) tablet 100 mg (not administered)    Or  thiamine (B-1) injection 100 mg (not administered)  folic acid (FOLVITE) tablet 1 mg (not administered)  multivitamin with minerals tablet 1 tablet (not administered)  albuterol (PROVENTIL) (2.5 MG/3ML) 0.083% nebulizer solution 2.5 mg (not administered)  acetaminophen (TYLENOL) tablet 650 mg (not administered)    Or    acetaminophen (TYLENOL) suppository 650 mg (not administered)  0.9 %  sodium chloride infusion (not administered)  pantoprazole (PROTONIX) injection 40 mg (not administered)  sodium chloride 0.9 % bolus 1,000 mL (0 mLs Intravenous Stopped 03/09/17 0001)  albuterol (PROVENTIL,VENTOLIN) solution continuous neb (10 mg/hr Nebulization Given 03/08/17 2305)  ipratropium (ATROVENT) nebulizer solution 0.5 mg (0.5 mg Nebulization Given 03/08/17 2305)  pantoprazole (PROTONIX) injection 40 mg (40 mg Intravenous Given 03/08/17 2312)     Initial Impression / Assessment and Plan / ED Course  I have reviewed the triage vital signs and the nursing notes.  Pertinent labs & imaging results that were available during my care of the patient were reviewed by me and considered in my medical decision making (see chart for details).     Roy Rosales is a 51 year old male with history of hypertension, alcohol abuse, polysubstance abuse who presents to the ED with melena. Patient's vitals at time of arrival to the ED are significant for tachycardia but otherwise are unremarkable. Patient states that he has had dark tarry stools for the last 3 days. Patient states that he has had some nausea and vomiting as well but has no vomiting of blood. Patient was seen here in the ED several days ago for PCP intoxication. Patient had elevated CK at that time and left AGAINST MEDICAL ADVICE before being admitted for fluid resuscitation. Patient states that his urine is clear and has had no problems urinating. Patient states that he drinks about a 12 pack a day of ETOH and denies any history of ulcer, varices, blood thinner use, NSAID use. Patient denies any abdominal pain. On exam, patient has nontender and soft abdomen. Patient has questionable melena on examination as unable to get good stool sample. Hemoccult was positive. Patient is tachycardic but denies dizziness, chest pain and overall exam is unremarkable. Patient does have  some wheezing throughout and will give DuoNeb treatment. Patient is a heavy smoker as well. Possible mild COPD exacerbation. Patient had labs prior to my evaluation with CBC, CMP, urinalysis, lactic acid. We'll get a chest x-ray as well.  Patient with hemoglobin of 12.8 which is a significant drop from about 16 from 3 days ago. Concern for upper GI bleed and GI consulted  and will plan for EGD tomorrow. Patient given IV Protonix and type and screen was sent. 2 large bore IVs were placed. Patient with no signs of acute kidney injury and unremarkable urinalysis and believe patient likely with resolved rhabdomyolysis. Patient has elevated white count to 25.5 which is stable from several days ago, which is possible from stress reaction from nontraumatic rhabdomyolysis. Patient had no signs of pneumonia, pneumothorax, pleural effusion on chest x-ray and had no signs of urinary tract infection. Blood cultures are collected. Patient denies IV drug use and no obvious murmur and endocarditis. Patient to be admitted to medicine service for further workup. Patient made nothing by mouth for likely EGD in the morning. Patient transferred to the medicine service in stable condition. Patient likely with bleeding stomach ulcer.  Final Clinical Impressions(s) / ED Diagnoses   Final diagnoses:  Melena  Acute blood loss anemia  Leukocytosis, unspecified type    New Prescriptions Current Discharge Medication List       Lennice Sites, DO 03/09/17 0107    Fatima Blank, MD 03/10/17 [phone]

## 2017-03-08 NOTE — ED Notes (Signed)
Patient transported to X-ray 

## 2017-03-08 NOTE — ED Notes (Signed)
Attempted report 

## 2017-03-08 NOTE — ED Triage Notes (Signed)
Pt states he has not eaten or drank anything in 4 days and urine is "black". Pt states he has been having night sweats. Denies fever. Denies pain

## 2017-03-08 NOTE — ED Notes (Signed)
Nurse currently starting IV and will get blood.

## 2017-03-08 NOTE — H&P (Signed)
History and Physical  Patient Name: Roy Rosales     CXK:[id number]    DOB: 1965/12/20    DOA: 03/08/2017 PCP: Patient, No Pcp Per   Patient coming from: Home  Chief Complaint: Melena, anorexia  HPI: Roy Rosales is a 51 y.o. male with a past medical history significant for polysubstance abuse, HTN, and prostate Cancer who presents with melena.  The patient was at a party last weekend, was given PCP and marijuana to smoke, became belligerent and was brought to the ER.  There he had leukocytosis and elevated CK, but elft AMA after fluids to go smoke a cigarette.  Since then, he claims now he has had new upset stomach, coffee-ground emesis, and melena.  He can't eat a thing for four days because he has no appetite and when he did eat he vomited.  He has had no fever, chills, cough, sputum production, urinary irritative symptoms.  He takes no aspirin or blood thinners.  He does drink 12 drinks per day for a long time, sometimes more, hasn't used crack in 28 years, uses no IV drugs.  Has no known liver disease.  ED course: -Afebrile, heart rate 94, respirations and pulse ox normal, BP 139/90 -Na 133, K 3.6, Cr 0.85, WBC 25.5K, Hgb 12.8 (baseline 16 earlier this week) -AST and ALT 131 and 76 respectively, Tbili normal -UA clear -Lactic acid normal -FOBT positive and ?melena on exam by EDP -CXR without focal opacity -He was given Protonix and discussed with GI who recommended EGD tomorrow     ROS: Review of Systems  Constitutional: Negative for chills and fever.  Respiratory: Negative for cough and sputum production.   Gastrointestinal: Positive for melena, nausea and vomiting. Negative for abdominal pain.  Genitourinary: Negative for dysuria, frequency and urgency.  Psychiatric/Behavioral: Positive for substance abuse.  All other systems reviewed and are negative.         Past Medical History:  Diagnosis Date  . Hypertension     History reviewed. No pertinent surgical  history.  Social History: Patient lives with his girlfriend and son.  The patient walks unassisted.  He does carpentry, from time to time.  He smokes. Uses PCP and THC smoked.  He does not use IV drugs, never has.  Crack last 30 years ago.  Frequent alcohol, doesn't know about withdrawals because he doesn't stop.  No Known Allergies  Family history: family history includes Breast cancer in his mother; Cancer in his sister; Hypertension in his mother.  Prior to Admission medications   Medication Sig Start Date End Date Taking? Authorizing Provider  atenolol (TENORMIN) 100 MG tablet Take 100 mg by mouth daily.   Yes [provider]  diphenhydrAMINE (BENADRYL) 25 MG tablet Take 1 tablet (25 mg total) by mouth every 6 (six) hours. X 3 days then PRN allergic reaction/itching 03/08/15  Yes West, Emily, PA-C  ENSURE PLUS (ENSURE PLUS) LIQD Take 237 mLs by mouth 3 (three) times daily as needed (supplement).   Yes [provider]  EPINEPHrine 0.3 mg/0.3 mL IJ SOAJ injection Inject 0.3 mLs (0.3 mg total) into the muscle once as needed (severe allergic reaction, difficulty swallowing or breathing). 03/08/15  Yes West, Emily, PA-C  famotidine (PEPCID) 20 MG tablet Take 1 tablet (20 mg total) by mouth 2 (two) times daily. X 3 day then PRN allergic reaction 03/08/15  Yes West, Emily, PA-C  triamterene-hydrochlorothiazide (MAXZIDE) 75-50 MG per tablet Take 1 tablet by mouth daily.   Yes [provider]       Physical Exam: BP (!) 142/82   Pulse 95   Temp 98.2 F (36.8 C) (Oral)   Resp (!) 8   Ht 6\' 1"  (1.854 m)   Wt 113.4 kg (250 lb)   SpO2 100%   BMI 32.98 kg/m  General appearance: Well-developed, adult male, awake, on nebulizer, appears agitated and with odd affect, but in no acute distress.   Eyes: Anicteric, conjunctiva pink, lids and lashes normal. PERRL.    ENT: No nasal deformity, discharge, epistaxis.  Hearing normal. OP moist without lesions.   Neck: No neck masses.   Trachea midline.  No thyromegaly/tenderness. Lymph: No cervical or supraclavicular lymphadenopathy. Skin: Warm and dry.  No suspicious rashes or lesions on face, neck, back, abdomen, arms, or legs. Cardiac: Tachycardia, nl S1-S2, no murmurs appreciated.  Capillary refill is brisk.  JVP normal.  No LE edema.  Radial and DP pulses 2+ and symmetric. Respiratory: Normal respiratory rate and rhythm.  Very coarse throughout without focal rales, wheezes. Abdomen: Abdomen soft.  No TTP. No ascites, distension, hepatosplenomegaly.   MSK: No deformities or effusions.  No cyanosis or clubbing. Neuro: Cranial nerves 3-12 normal.  Sensation intact to light touch. Speech is fluent.  Muscle strength 5/5 and symmetric.    Psych: Sensorium intact and responding to questions, attention normal.  Behavior with pressured speech, elevated mood, possibly still intoxicated?  Affect agitated but pleasant.  Judgment and insight appear impaired.     Labs on Admission:  I have personally reviewed following labs and imaging studies: CBC:  Recent Labs Lab 03/05/17 2258 03/08/17 1820  WBC 20.5* 25.5*  NEUTROABS 17.0* 16.8*  HGB 15.7 12.8*  HCT 46.9 39.2  MCV 90.4 91.6  PLT 269 361   Basic Metabolic Panel:  Recent Labs Lab 03/05/17 2258 03/06/17 0337 03/08/17 1820  NA 138 139 133*  K 3.2* 3.3* 3.6  CL 102 109 100*  CO2 21* 22 25  GLUCOSE 126* 88 106*  BUN 10 9 13   CREATININE 1.45* 1.04 0.85  CALCIUM 10.0 7.9* 9.7   GFR: Estimated Creatinine Clearance: 137.2 mL/min (by C-G formula based on SCr of 0.85 mg/dL).  Liver Function Tests:  Recent Labs Lab 03/05/17 2258 03/08/17 1820  AST 74* 131*  ALT 51 76*  ALKPHOS 57 47  BILITOT 1.0 0.6  PROT 7.6 7.0  ALBUMIN 4.0 3.9   No results for input(s): LIPASE, AMYLASE in the last 168 hours.  Recent Labs Lab 03/06/17 0011  AMMONIA 25   Coagulation Profile:  Recent Labs Lab 03/05/17 2250  INR 1.15   Cardiac Enzymes:  Recent Labs Lab  03/05/17 2258 03/06/17 0337  CKTOTAL 2,858* 3,174*  TROPONINI 0.06* 0.05*   BNP (last 3 results) No results for input(s): PROBNP in the last 8760 hours. HbA1C: No results for input(s): HGBA1C in the last 72 hours. CBG: No results for input(s): GLUCAP in the last 168 hours. Lipid Profile: No results for input(s): CHOL, HDL, LDLCALC, TRIG, CHOLHDL, LDLDIRECT in the last 72 hours. Thyroid Function Tests:  Recent Labs  03/06/17 0011  TSH 1.231   Anemia Panel: No results for input(s): VITAMINB12, FOLATE, FERRITIN, TIBC, IRON, RETICCTPCT in the last 72 hours. Sepsis Labs: Lactic acid normal Invalid input(s): PROCALCITONIN, LACTICIDVEN Recent Results (from the past 240 hour(s))  Blood culture (routine x 2)     Status: None (Preliminary result)   Collection Time: 03/05/17 10:50 PM  Result Value Ref Range Status   Specimen Description  BLOOD LEFT ARM  Final   Special Requests   Final    BOTTLES DRAWN AEROBIC AND ANAEROBIC Blood Culture adequate volume   Culture NO GROWTH 2 DAYS  Final   Report Status PENDING  Incomplete  Blood culture (routine x 2)     Status: None (Preliminary result)   Collection Time: 03/06/17 12:10 AM  Result Value Ref Range Status   Specimen Description BLOOD RIGHT HAND  Final   Special Requests   Final    BOTTLES DRAWN AEROBIC AND ANAEROBIC Blood Culture adequate volume   Culture NO GROWTH 2 DAYS  Final   Report Status PENDING  Incomplete  Urine culture     Status: Abnormal   Collection Time: 03/06/17  2:11 AM  Result Value Ref Range Status   Specimen Description URINE, CLEAN CATCH  Final   Special Requests NONE  Final   Culture MULTIPLE SPECIES PRESENT, SUGGEST RECOLLECTION (A)  Final   Report Status 03/07/2017 FINAL  Final         Radiological Exams on Admission: Personally reviewed CXR shows no focal opacity or edema: Dg Chest 2 View  Result Date: 03/08/2017 CLINICAL DATA:  Elevated white cell count, cough, dark urine, night sweats. History of  hypertension. EXAM: CHEST  2 VIEW COMPARISON:  03/05/2017 FINDINGS: The heart size and mediastinal contours are within normal limits. Both lungs are clear. The visualized skeletal structures are unremarkable. IMPRESSION: No active cardiopulmonary disease. Electronically Signed   By: Lucienne Capers M.D.   On: 03/08/2017 23:12    EKG: Independently reviewed. Rate 92, QTc 447, no ST changes.        Assessment/Plan  1. Melena:  Alcohol use+, no known liver disease/varices, no blood thinners.   -NPO -MIVF -PPI BID -Consult to GI, appreciate cares -Repeat CBC tomorrow   2. Hyponatremia:  In setting of HCTZ, mild. -Fluids and trend  3. Leukocytosis:  Probably reactive, will hold antibiotics for now. -Obtain blood cultures -Monitor fever curve  4. Alcohol use:  -CIWA -Thiamine -Gabapentin 300 BID started for subacute withdrawal after discharge -SW consult for outpatient treatment referral  5. Hypertension:  -Hold HCTZ and triamterene until hemodynamics clearer -Continue atenolol  6. Transaminitis:  Suspect alcoholic hepatitis -Trend off alcohol -Alcohol cessation recommended  7. Rhabdomyolysis:  Maybe this has resolved.  Renal function okay, and CK last abnormal 4 days ago -Check CK -IVF     DVT prophylaxis: SCDs  Code Status: FULL  Family Communication: None present  Disposition Plan: Anticipate IV fluids, PPI, GI consultation.  FOllow ancillary studies. Consults called: GI  Admission status: OBS At the point of initial evaluation, it is my clinical opinion that admission for OBSERVATION is reasonable and necessary because the patient's presenting complaints in the context of their chronic conditions represent sufficient risk of deterioration or significant morbidity to constitute reasonable grounds for close observation in the hospital setting, but that the patient may be medically stable for discharge from the hospital within 24 to 48 hours.    Medical  decision making: Patient seen at 11:30 PM on 03/08/2017.  The patient was discussed with Dr. Ivin Booty.  What exists of the patient's chart was reviewed in depth and summarized above.  Clinical condition: stable.        Edwin Dada [facility] Pager [phone]

## 2017-03-09 ENCOUNTER — Encounter (HOSPITAL_COMMUNITY): Payer: Self-pay | Admitting: Family Medicine

## 2017-03-09 LAB — TYPE AND SCREEN
ABO/RH(D): AB POS
ANTIBODY SCREEN: NEGATIVE

## 2017-03-09 LAB — ABO/RH: ABO/RH(D): AB POS

## 2017-03-09 MED ORDER — LORAZEPAM 2 MG/ML IJ SOLN: 1.0000 mg | Freq: Four times a day (QID) | INTRAMUSCULAR | Status: DC | PRN

## 2017-03-09 MED ORDER — GABAPENTIN 300 MG PO CAPS
300.0000 mg | ORAL_CAPSULE | Freq: Two times a day (BID) | ORAL | Status: DC
  Administered 2017-03-09: 300 mg via ORAL
  Filled 2017-03-09: qty 1

## 2017-03-09 MED ORDER — LORAZEPAM 1 MG PO TABS
1.0000 mg | ORAL_TABLET | Freq: Four times a day (QID) | ORAL | Status: DC | PRN
  Administered 2017-03-09: 1 mg via ORAL
  Filled 2017-03-09: qty 1

## 2017-03-09 MED ORDER — ADULT MULTIVITAMIN W/MINERALS CH: 1.0000 | ORAL_TABLET | Freq: Every day | ORAL | Status: DC

## 2017-03-09 MED ORDER — ATENOLOL 50 MG PO TABS
100.0000 mg | ORAL_TABLET | Freq: Every day | ORAL | Status: DC
  Administered 2017-03-09: 100 mg via ORAL
  Filled 2017-03-09: qty 2

## 2017-03-09 MED ORDER — SODIUM CHLORIDE 0.9 % IV SOLN
INTRAVENOUS | Status: DC
  Administered 2017-03-09: 02:00:00 via INTRAVENOUS

## 2017-03-09 MED ORDER — WHITE PETROLATUM GEL
Status: AC
  Administered 2017-03-09: 02:00:00
  Filled 2017-03-09: qty 1

## 2017-03-09 MED ORDER — ACETAMINOPHEN 325 MG PO TABS: 650.0000 mg | ORAL_TABLET | Freq: Four times a day (QID) | ORAL | Status: DC | PRN

## 2017-03-09 MED ORDER — FOLIC ACID 1 MG PO TABS: 1.0000 mg | ORAL_TABLET | Freq: Every day | ORAL | Status: DC

## 2017-03-09 MED ORDER — ACETAMINOPHEN 650 MG RE SUPP: 650.0000 mg | Freq: Four times a day (QID) | RECTAL | Status: DC | PRN

## 2017-03-09 MED ORDER — ALBUTEROL SULFATE (2.5 MG/3ML) 0.083% IN NEBU: 2.5000 mg | INHALATION_SOLUTION | RESPIRATORY_TRACT | Status: DC | PRN

## 2017-03-09 MED ORDER — PANTOPRAZOLE SODIUM 40 MG IV SOLR: 40.0000 mg | Freq: Two times a day (BID) | INTRAVENOUS | Status: DC

## 2017-03-09 MED ORDER — VITAMIN B-1 100 MG PO TABS
100.0000 mg | ORAL_TABLET | Freq: Every day | ORAL | Status: DC
  Administered 2017-03-09: 100 mg via ORAL
  Filled 2017-03-09: qty 1

## 2017-03-09 MED ORDER — NICOTINE 21 MG/24HR TD PT24: 21.0000 mg | MEDICATED_PATCH | Freq: Every day | TRANSDERMAL | Status: DC

## 2017-03-09 MED ORDER — THIAMINE HCL 100 MG/ML IJ SOLN: 100.0000 mg | Freq: Every day | INTRAMUSCULAR | Status: DC

## 2017-03-09 NOTE — ED Notes (Signed)
Pt completed continuous neb

## 2017-03-09 NOTE — Discharge Summary (Signed)
Physician Discharge Summary  Roy Rosales OVF:[id number] DOB: Mar 04, 1966 DOA: 03/08/2017  PCP: Patient, No Pcp Per  Admit date: 03/08/2017 Discharge date: 03/09/2017  Admitted From: Home Disposition:  Unknown  Patient left AMA without opportunity for physician to evaluate and discuss.    Discharge Condition: Unknown, per nursing notes seems similar to when I evaluated him  CODE STATUS: FULL   Brief/Interim Summary: The patient was admitted for melena, GI were consulted.  Before the patient could be seen by my partners or GI, he left AMA. Neither I nor our night coverage were called.  The patient had been slightly intoxicated with alcohol when I evaluated him but coherent and decisional.  My concern that he had life-threatening bleeding was discussed with the patient who expressed understanding.    Discharge Diagnoses:  Principal Problem:   Melena Active Problems:   Rhabdomyolysis   Hyponatremia   Alcoholism   Leukocytosis   Transaminitis   Essential hypertension    Discharge Instructions   Allergies as of 03/09/2017   No Known Allergies     Medication List    ASK your doctor about these medications   atenolol 100 MG tablet Commonly known as:  TENORMIN Take 100 mg by mouth daily.   diphenhydrAMINE 25 MG tablet Commonly known as:  BENADRYL Take 1 tablet (25 mg total) by mouth every 6 (six) hours. X 3 days then PRN allergic reaction/itching   ENSURE PLUS Liqd Take 237 mLs by mouth 3 (three) times daily as needed (supplement).   EPINEPHrine 0.3 mg/0.3 mL Soaj injection Commonly known as:  EPI-PEN Inject 0.3 mLs (0.3 mg total) into the muscle once as needed (severe allergic reaction, difficulty swallowing or breathing).   famotidine 20 MG tablet Commonly known as:  PEPCID Take 1 tablet (20 mg total) by mouth 2 (two) times daily. X 3 day then PRN allergic reaction   triamterene-hydrochlorothiazide 75-50 MG tablet Commonly known as:  MAXZIDE Take 1 tablet by  mouth daily.       No Known Allergies  Consultations:  GI   Procedures/Studies: Dg Chest 1 View  Result Date: 03/06/2017 CLINICAL DATA:  Acute onset of altered mental status. Patient became aggressive. Initial encounter. EXAM: CHEST 1 VIEW COMPARISON:  Chest radiograph performed 06/24/2013 FINDINGS: The lungs are well-aerated and clear. There is no evidence of focal opacification, pleural effusion or pneumothorax. The cardiomediastinal silhouette is within normal limits. No acute osseous abnormalities are seen. IMPRESSION: No acute cardiopulmonary process seen. Electronically Signed   By: Garald Balding M.D.   On: 03/06/2017 00:01   Dg Chest 2 View  Result Date: 03/08/2017 CLINICAL DATA:  Elevated white cell count, cough, dark urine, night sweats. History of hypertension. EXAM: CHEST  2 VIEW COMPARISON:  03/05/2017 FINDINGS: The heart size and mediastinal contours are within normal limits. Both lungs are clear. The visualized skeletal structures are unremarkable. IMPRESSION: No active cardiopulmonary disease. Electronically Signed   By: Lucienne Capers M.D.   On: 03/08/2017 23:12   Ct Head Wo Contrast  Result Date: 03/05/2017 CLINICAL DATA:  Altered mental status. EXAM: CT HEAD WITHOUT CONTRAST TECHNIQUE: Contiguous axial images were obtained from the base of the skull through the vertex without intravenous contrast. COMPARISON:  None. FINDINGS: Brain: No evidence of acute infarction, hemorrhage, hydrocephalus, extra-axial collection or mass lesion/mass effect. Vascular: No hyperdense vessel or unexpected calcification. Skull: Normal. Negative for fracture or focal lesion. Sinuses/Orbits: Mild mucosal thickening of right side of sphenoid sinus. No fluid levels. Left mastoid air  cells are hypo pneumatized. Visualized orbits are unremarkable. Other: None. IMPRESSION: No acute intracranial abnormality. Electronically Signed   By: Jeb Levering M.D.   On: 03/05/2017 23:43        Subjective:   Discharge Exam: Vitals:   03/09/17 0016 03/09/17 0057  BP: (!) 167/107 (!) 141/96  Pulse: (!) 133 (!) 116  Resp:  17  Temp:  98.4 F (36.9 C)   Vitals:   03/08/17 2315 03/09/17 0015 03/09/17 0016 03/09/17 0057  BP: (!) 142/82 (!) 167/107 (!) 167/107 (!) 141/96  Pulse: 95 (!) 129 (!) 133 (!) 116  Resp: (!) 8 16  17   Temp:    98.4 F (36.9 C)  TempSrc:    Oral  SpO2: 100% 99%  99%  Weight:    107.1 kg (236 lb 1.6 oz)  Height:    6\' 1"  (1.854 m)    Patient was not examined before leaving the hospital.   The results of significant diagnostics from this hospitalization (including imaging, microbiology, ancillary and laboratory) are listed below for reference.     Microbiology: Recent Results (from the past 240 hour(s))  Blood culture (routine x 2)     Status: None (Preliminary result)   Collection Time: 03/05/17 10:50 PM  Result Value Ref Range Status   Specimen Description BLOOD LEFT ARM  Final   Special Requests   Final    BOTTLES DRAWN AEROBIC AND ANAEROBIC Blood Culture adequate volume   Culture NO GROWTH 3 DAYS  Final   Report Status PENDING  Incomplete  Blood culture (routine x 2)     Status: None (Preliminary result)   Collection Time: 03/06/17 12:10 AM  Result Value Ref Range Status   Specimen Description BLOOD RIGHT HAND  Final   Special Requests   Final    BOTTLES DRAWN AEROBIC AND ANAEROBIC Blood Culture adequate volume   Culture NO GROWTH 3 DAYS  Final   Report Status PENDING  Incomplete  Urine culture     Status: Abnormal   Collection Time: 03/06/17  2:11 AM  Result Value Ref Range Status   Specimen Description URINE, CLEAN CATCH  Final   Special Requests NONE  Final   Culture MULTIPLE SPECIES PRESENT, SUGGEST RECOLLECTION (A)  Final   Report Status 03/07/2017 FINAL  Final     Labs: BNP (last 3 results) No results for input(s): BNP in the last 8760 hours. Basic Metabolic Panel:  Recent Labs Lab 03/05/17 2258  03/06/17 0337 03/08/17 1820  NA 138 139 133*  K 3.2* 3.3* 3.6  CL 102 109 100*  CO2 21* 22 25  GLUCOSE 126* 88 106*  BUN 10 9 13   CREATININE 1.45* 1.04 0.85  CALCIUM 10.0 7.9* 9.7   Liver Function Tests:  Recent Labs Lab 03/05/17 2258 03/08/17 1820  AST 74* 131*  ALT 51 76*  ALKPHOS 57 47  BILITOT 1.0 0.6  PROT 7.6 7.0  ALBUMIN 4.0 3.9   No results for input(s): LIPASE, AMYLASE in the last 168 hours.  Recent Labs Lab 03/06/17 0011  AMMONIA 25   CBC:  Recent Labs Lab 03/05/17 2258 03/08/17 1820  WBC 20.5* 25.5*  NEUTROABS 17.0* 16.8*  HGB 15.7 12.8*  HCT 46.9 39.2  MCV 90.4 91.6  PLT 269 275   Cardiac Enzymes:  Recent Labs Lab 03/05/17 2258 03/06/17 0337  CKTOTAL 2,858* 3,174*  TROPONINI 0.06* 0.05*   BNP: Invalid input(s): POCBNP CBG: No results for input(s): GLUCAP in the last 168 hours. D-Dimer  No results for input(s): DDIMER in the last 72 hours. Hgb A1c No results for input(s): HGBA1C in the last 72 hours. Lipid Profile No results for input(s): CHOL, HDL, LDLCALC, TRIG, CHOLHDL, LDLDIRECT in the last 72 hours. Thyroid function studies No results for input(s): TSH, T4TOTAL, T3FREE, THYROIDAB in the last 72 hours.  Invalid input(s): FREET3 Anemia work up No results for input(s): VITAMINB12, FOLATE, FERRITIN, TIBC, IRON, RETICCTPCT in the last 72 hours. Urinalysis    Component Value Date/Time   COLORURINE COLORLESS (A) 03/08/2017 1823   APPEARANCEUR CLEAR 03/08/2017 1823   LABSPEC 1.001 (L) 03/08/2017 1823   PHURINE 7.0 03/08/2017 1823   GLUCOSEU NEGATIVE 03/08/2017 1823   HGBUR NEGATIVE 03/08/2017 1823   BILIRUBINUR NEGATIVE 03/08/2017 1823   KETONESUR NEGATIVE 03/08/2017 1823   PROTEINUR NEGATIVE 03/08/2017 1823   NITRITE NEGATIVE 03/08/2017 1823   LEUKOCYTESUR NEGATIVE 03/08/2017 1823   Sepsis Labs Invalid input(s): PROCALCITONIN,  WBC,  LACTICIDVEN Microbiology Recent Results (from the past 240 hour(s))  Blood culture  (routine x 2)     Status: None (Preliminary result)   Collection Time: 03/05/17 10:50 PM  Result Value Ref Range Status   Specimen Description BLOOD LEFT ARM  Final   Special Requests   Final    BOTTLES DRAWN AEROBIC AND ANAEROBIC Blood Culture adequate volume   Culture NO GROWTH 3 DAYS  Final   Report Status PENDING  Incomplete  Blood culture (routine x 2)     Status: None (Preliminary result)   Collection Time: 03/06/17 12:10 AM  Result Value Ref Range Status   Specimen Description BLOOD RIGHT HAND  Final   Special Requests   Final    BOTTLES DRAWN AEROBIC AND ANAEROBIC Blood Culture adequate volume   Culture NO GROWTH 3 DAYS  Final   Report Status PENDING  Incomplete  Urine culture     Status: Abnormal   Collection Time: 03/06/17  2:11 AM  Result Value Ref Range Status   Specimen Description URINE, CLEAN CATCH  Final   Special Requests NONE  Final   Culture MULTIPLE SPECIES PRESENT, SUGGEST RECOLLECTION (A)  Final   Report Status 03/07/2017 FINAL  Final       SIGNED:   Edwin Dada, MD  [facility] 03/09/2017, 8:50 PM   If 7PM-7AM, please contact night-coverage [email or web address] Password [email or web address]

## 2017-03-09 NOTE — Progress Notes (Signed)
We were asked to see pt b/o apparent GIB, characterized by melena, hgb 12.8, BUN 12.  However, the pt left the hospital this morning c. 4 am  "to go smoke a cigarette," but has not returned so far, per conversation w/ Lars Mage, RN.  He is currently considered "discharged."  Please notify us if/when pt returns and you would like [place] to see him.  Cleotis Nipper, M.D. Pager [phone] If no answer or after 5 PM call [phone]

## 2017-03-09 NOTE — Progress Notes (Signed)
Pt removed IV and SCDs, put on his clothes and exited his room. I asked pt where he was going and he stated to smoke a cigarette. I advised pt to return to room or use the correct exit. Pt proceeded to exit via the stairs. Pt belongings still in room. MD notified.

## 2017-03-09 NOTE — ED Notes (Signed)
Attempted report 

## 2017-03-11 LAB — CULTURE, BLOOD (ROUTINE X 2)
CULTURE: NO GROWTH
CULTURE: NO GROWTH
SPECIAL REQUESTS: ADEQUATE
SPECIAL REQUESTS: ADEQUATE

## 2017-03-14 LAB — CULTURE, BLOOD (ROUTINE X 2)
CULTURE: NO GROWTH
Culture: NO GROWTH
SPECIAL REQUESTS: ADEQUATE
SPECIAL REQUESTS: ADEQUATE

## 2018-07-12 ENCOUNTER — Emergency Department (HOSPITAL_COMMUNITY)
Admission: EM | Admit: 2018-07-12 | Discharge: 2018-07-12 | Disposition: A | Discharge status: Home / Self Care | Payer: Self-pay | Attending: Emergency Medicine | Admitting: Emergency Medicine

## 2018-07-12 ENCOUNTER — Other Ambulatory Visit: Payer: Self-pay

## 2018-07-12 ENCOUNTER — Emergency Department (HOSPITAL_COMMUNITY): Payer: Self-pay

## 2018-07-12 ENCOUNTER — Encounter (HOSPITAL_COMMUNITY): Payer: Self-pay | Admitting: Emergency Medicine

## 2018-07-12 DIAGNOSIS — I10 Essential (primary) hypertension: Secondary | ICD-10-CM | POA: Insufficient documentation

## 2018-07-12 DIAGNOSIS — X58XXXA Exposure to other specified factors, initial encounter: Secondary | ICD-10-CM | POA: Insufficient documentation

## 2018-07-12 DIAGNOSIS — Y999 Unspecified external cause status: Secondary | ICD-10-CM | POA: Insufficient documentation

## 2018-07-12 DIAGNOSIS — Z79899 Other long term (current) drug therapy: Secondary | ICD-10-CM | POA: Insufficient documentation

## 2018-07-12 DIAGNOSIS — Y929 Unspecified place or not applicable: Secondary | ICD-10-CM | POA: Insufficient documentation

## 2018-07-12 DIAGNOSIS — S42291A Other displaced fracture of upper end of right humerus, initial encounter for closed fracture: Secondary | ICD-10-CM | POA: Insufficient documentation

## 2018-07-12 DIAGNOSIS — M25511 Pain in right shoulder: Secondary | ICD-10-CM

## 2018-07-12 DIAGNOSIS — M546 Pain in thoracic spine: Secondary | ICD-10-CM | POA: Insufficient documentation

## 2018-07-12 DIAGNOSIS — Y939 Activity, unspecified: Secondary | ICD-10-CM | POA: Insufficient documentation

## 2018-07-12 DIAGNOSIS — S00512A Abrasion of oral cavity, initial encounter: Secondary | ICD-10-CM | POA: Insufficient documentation

## 2018-07-12 DIAGNOSIS — F17210 Nicotine dependence, cigarettes, uncomplicated: Secondary | ICD-10-CM | POA: Insufficient documentation

## 2018-07-12 DIAGNOSIS — R569 Unspecified convulsions: Secondary | ICD-10-CM | POA: Insufficient documentation

## 2018-07-12 LAB — COMPREHENSIVE METABOLIC PANEL
ALBUMIN: 4 g/dL (ref 3.5–5.0)
ALK PHOS: 99 U/L (ref 38–126)
ALT: 28 U/L (ref 0–44)
AST: 41 U/L (ref 15–41)
Anion gap: 11 (ref 5–15)
BILIRUBIN TOTAL: 0.8 mg/dL (ref 0.3–1.2)
BUN: 8 mg/dL (ref 6–20)
CALCIUM: 9.4 mg/dL (ref 8.9–10.3)
CO2: 24 mmol/L (ref 22–32)
CREATININE: 1.4 mg/dL — AB (ref 0.61–1.24)
Chloride: 100 mmol/L (ref 98–111)
GFR calc Af Amer: >60 mL/min (ref >60)
GFR calc non Af Amer: 57 mL/min — ABNORMAL LOW (ref >60)
GLUCOSE: 106 mg/dL — AB (ref 70–99)
Potassium: 3 mmol/L — ABNORMAL LOW (ref 3.5–5.1)
SODIUM: 135 mmol/L (ref 135–145)
TOTAL PROTEIN: 7.6 g/dL (ref 6.5–8.1)

## 2018-07-12 LAB — CBC WITH DIFFERENTIAL/PLATELET
ABS IMMATURE GRANULOCYTES: 0.3 10*3/uL — AB (ref 0.0–0.1)
BASOS ABS: 0.1 10*3/uL (ref 0.0–0.1)
Basophils Relative: 0 %
EOS PCT: 0 %
Eosinophils Absolute: 0.1 10*3/uL (ref 0.0–0.7)
HEMATOCRIT: 50.5 % (ref 39.0–52.0)
Hemoglobin: 16.1 g/dL (ref 13.0–17.0)
Immature Granulocytes: 2 %
LYMPHS PCT: 7 %
Lymphs Abs: 1.1 10*3/uL (ref 0.7–4.0)
MCH: 29.8 pg (ref 26.0–34.0)
MCHC: 31.9 g/dL (ref 30.0–36.0)
MCV: 93.5 fL (ref 78.0–100.0)
MONO ABS: 1.4 10*3/uL — AB (ref 0.1–1.0)
Monocytes Relative: 8 %
NEUTROS ABS: 14.5 10*3/uL — AB (ref 1.7–7.7)
NEUTROS PCT: 83 %
Platelets: 290 10*3/uL (ref 150–400)
RBC: 5.4 MIL/uL (ref 4.22–5.81)
RDW: 13 % (ref 11.5–15.5)
WBC: 17.4 10*3/uL — ABNORMAL HIGH (ref 4.0–10.5)

## 2018-07-12 LAB — RAPID URINE DRUG SCREEN, HOSP PERFORMED
Amphetamines: NOT DETECTED
BARBITURATES: NOT DETECTED
Benzodiazepines: NOT DETECTED
Cocaine: NOT DETECTED
Opiates: NOT DETECTED
Tetrahydrocannabinol: POSITIVE — AB

## 2018-07-12 LAB — CK: CK TOTAL: 663 U/L — AB (ref 49–397)

## 2018-07-12 MED ORDER — POTASSIUM CHLORIDE CRYS ER 20 MEQ PO TBCR
40.0000 meq | EXTENDED_RELEASE_TABLET | Freq: Once | ORAL | Status: AC
  Administered 2018-07-12: 40 meq via ORAL
  Filled 2018-07-12: qty 2

## 2018-07-12 MED ORDER — OXYCODONE HCL 5 MG PO TABS
5.0000 mg | ORAL_TABLET | Freq: Once | ORAL | Status: AC
  Administered 2018-07-12: 5 mg via ORAL
  Filled 2018-07-12: qty 1

## 2018-07-12 MED ORDER — SODIUM CHLORIDE 0.9 % IV BOLUS
1000.0000 mL | Freq: Once | INTRAVENOUS | Status: AC
  Administered 2018-07-12: 1000 mL via INTRAVENOUS

## 2018-07-12 MED ORDER — OXYCODONE HCL 5 MG PO TABS: 5.0000 mg | ORAL_TABLET | Freq: Four times a day (QID) | ORAL | 0 refills | Status: DC | PRN

## 2018-07-12 MED ORDER — DIAZEPAM 5 MG PO TABS
5.0000 mg | ORAL_TABLET | Freq: Once | ORAL | Status: AC
  Administered 2018-07-12: 5 mg via ORAL
  Filled 2018-07-12: qty 1

## 2018-07-12 NOTE — ED Provider Notes (Signed)
[facility]PART[facility] Provider Note   CSN: [id number] Arrival date & time: 07/12/18  0604     History   Chief Complaint Chief Complaint  Patient presents with  . Back Pain    HPI Roy Rosales is a 52 y.o. male.  HPI  52 year old male with a history of hypertension, alcoholism, polysubstance abuse, prostate cancer who presents with concern for back pain, shoulder pain, and tongue lacerations.  Patient reports that he woke up at 4 AM with severe thoracic back pain and right shoulder pain.  Reports it feels like a tightness and spasm.  It is worse with movement, and improved with rest.  He denies any trauma, lifting.  Denies numbness, weakness, chest pain or dyspnea.  The pain is located through his middle back without radiation, and also located in the right shoulder without radiation.  Reports when he woke up in bed he also felt like his tongue was swollen.  Reports he drinks about 10 beers daily, and has continued to drink recently.  Denies a history of alcohol withdrawal, or alcohol withdrawal seizures.  Reports smoking reefer but denies other drug use.  No history of seizures.  Reports mild headache at this time.  Reports he did not take his blood pressure medications yet today.  Took tylenol for pain without relief.   Past Medical History:  Diagnosis Date  . Hypertension   . Polysubstance abuse ([place])   . Prostate cancer [facility])     Patient Active Problem List   Diagnosis Date Noted  . Melena 03/08/2017  . 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 Hyponatremia 03/08/2017  . Alcoholism 03/08/2017  . Leukocytosis 03/08/2017  . Transaminitis 03/08/2017  . Essential hypertension 03/08/2017    History reviewed. No pertinent surgical history.      Home Medications    Prior to Admission medications   Medication Sig Start Date End Date Taking? Authorizing Provider  triamterene-hydrochlorothiazide (MAXZIDE) 75-50 MG tablet Take 1 tablet by mouth daily.    Yes [provider]  diphenhydrAMINE (BENADRYL) 25 MG tablet Take 1 tablet (25 mg total) by mouth every 6 (six) hours. X 3 days then PRN allergic reaction/itching Patient not taking: Reported on 07/12/2018 03/08/15   Clayton Bibles, PA-C  EPINEPHrine 0.3 mg/0.3 mL IJ SOAJ injection Inject 0.3 mLs (0.3 mg total) into the muscle once as needed (severe allergic reaction, difficulty swallowing or breathing). Patient not taking: Reported on 07/12/2018 03/08/15   Clayton Bibles, PA-C  famotidine (PEPCID) 20 MG tablet Take 1 tablet (20 mg total) by mouth 2 (two) times daily. X 3 day then PRN allergic reaction Patient not taking: Reported on 07/12/2018 03/08/15   Clayton Bibles, PA-C  oxyCODONE (ROXICODONE) 5 MG immediate release tablet Take 1 tablet (5 mg total) by mouth every 6 (six) hours as needed for severe pain. 07/12/18   Gareth Morgan, MD    Family History Family History  Problem Relation Age of Onset  . Breast cancer Mother   . Hypertension Mother   . Cancer Sister     Social History Social History   Tobacco Use  . Smoking status: Current Every Day Smoker    Packs/day: 1.00    Types: Cigarettes  . Smokeless tobacco: Never Used  Substance Use Topics  . Alcohol use: Yes    Comment: 6-12+ beer/daqy  . Drug use: Yes    Types: Marijuana, PCP     Allergies   Patient has no known allergies.   Review of Systems Review  of Systems  Constitutional: Negative for fever.  HENT: Negative for sore throat.   Eyes: Negative for visual disturbance.  Respiratory: Negative for shortness of breath.   Cardiovascular: Negative for chest pain.  Gastrointestinal: Negative for abdominal pain, nausea and vomiting.  Genitourinary: Negative for difficulty urinating.  Musculoskeletal: Positive for arthralgias, back pain and myalgias. Negative for neck stiffness.  Skin: Negative for rash.  Neurological: Positive for headaches. Negative for syncope, weakness and numbness. Seizures: suspected given tongue  abrasions.     Physical Exam Updated Vital Signs BP (!) 145/83   Pulse 86   Temp 99.3 F (37.4 C) (Oral)   Resp 18   SpO2 98%   Physical Exam  Constitutional: He is oriented to person, place, and time. He appears well-developed and well-nourished. No distress.  HENT:  Head: Normocephalic.  Tongue lacerations/abrasions on side of tongue bilaterally  Eyes: Conjunctivae and EOM are normal.  Neck: Normal range of motion.  Cardiovascular: Normal rate, regular rhythm, normal heart sounds and intact distal pulses. Exam reveals no gallop and no friction rub.  No murmur heard. Pulmonary/Chest: Effort normal and breath sounds normal. No respiratory distress. He has no wheezes. He has no rales.  Abdominal: Soft. He exhibits no distension. There is no tenderness. There is no guarding.  Musculoskeletal: He exhibits no edema.       Right shoulder: He exhibits tenderness, bony tenderness and spasm. He exhibits no deformity.       Cervical back: He exhibits no bony tenderness.       Thoracic back: He exhibits tenderness. He exhibits no bony tenderness.  Neurological: He is alert and oriented to person, place, and time.  Skin: Skin is warm and dry. He is not diaphoretic.  Nursing note and vitals reviewed.    ED Treatments / Results  Labs (all labs ordered are listed, but only abnormal results are displayed) Labs Reviewed  CBC WITH DIFFERENTIAL/PLATELET - Abnormal; Notable for the following components:      Result Value   WBC 17.4 (*)    Neutro Abs 14.5 (*)    Monocytes Absolute 1.4 (*)    Abs Immature Granulocytes 0.3 (*)    All other components within normal limits  COMPREHENSIVE METABOLIC PANEL - Abnormal; Notable for the following components:   Potassium 3.0 (*)    Glucose, Bld 106 (*)    Creatinine, Ser 1.40 (*)    GFR calc non Af Amer 57 (*)    All other components within normal limits  RAPID URINE DRUG SCREEN, HOSP PERFORMED - Abnormal; Notable for the following components:    Tetrahydrocannabinol POSITIVE (*)    All other components within normal limits  CK - Abnormal; Notable for the following components:   Total CK 663 (*)    All other components within normal limits    EKG EKG Interpretation  Date/Time:  Friday July 12 2018 08:57:16 EDT Ventricular Rate:  82 PR Interval:    QRS Duration: 78 QT Interval:  374 QTC Calculation: 437 R Axis:   49 Text Interpretation:  Sinus rhythm No significant change since last tracing Confirmed by Gareth Morgan [phone]) on 07/12/2018 3:48:07 PM   Radiology Dg Chest 2 View  Result Date: 07/12/2018 CLINICAL DATA:  Back pain, dyspnea. EXAM: CHEST - 2 VIEW COMPARISON:  Radiographs of Mar 08, 2017. FINDINGS: The heart size and mediastinal contours are within normal limits. Both lungs are clear. No pneumothorax or pleural effusion is noted. There is interval development of wedge compression deformity  of 2 lower thoracic vertebral bodies. IMPRESSION: No active cardiopulmonary disease. Interval development of wedge compression deformity involving 2 lower thoracic vertebral bodies concerning for fractures of indeterminate age. MRI may be performed for further evaluation. Electronically Signed   By: Marijo Conception, M.D.   On: 07/12/2018 08:59   Dg Thoracic Spine 2 View  Result Date: 07/12/2018 CLINICAL DATA:  Sharp right shoulder and upper back pain since this morning. No known injury. Some shortness of breath. EXAM: THORACIC SPINE 2 VIEWS COMPARISON:  PA and lateral chest dated 03/08/2017 and today. FINDINGS: Mild scoliosis. Mild and moderate anterior and lateral spur formation at multiple levels. Approximately 35% compression deformity of the T8 vertebral body and 25% compression deformity of the T9 vertebral body, both with mild progression. No acute fracture lines or bony retropulsion seen. IMPRESSION: 1. Mild compression deformities of the T8 and T9 vertebral bodies with mild progression. No acute fracture lines are seen. 2.  Multilevel degenerative changes and mild scoliosis. Electronically Signed   By: Claudie Revering M.D.   On: 07/12/2018 09:02   Dg Shoulder Right  Result Date: 07/12/2018 CLINICAL DATA:  New onset right shoulder and back pain. Possible seizure activity. Prostate cancer. EXAM: RIGHT SHOULDER - 2+ VIEW COMPARISON:  None FINDINGS: There is focal sclerosis and irregularity across the right femoral neck. There is lucency and irregularity at the lesser tubercle. Shoulder is located. No other focal sclerotic lesions are evident. Visualized right hemithorax is clear. IMPRESSION: 1. Sclerosis and irregularity of the neck of the right humerus raises concern for metastasis and possible pathologic fracture. Given the possibility of seizure, this could be a traumatic fracture. Recommend CT of the shoulder without contrast for further evaluation. These results were called by telephone at the time of interpretation on 07/12/2018 at 9:10 am to Dr. Gareth Morgan , who verbally acknowledged these results. Electronically Signed   By: San Morelle M.D.   On: 07/12/2018 09:13   Ct Head Wo Contrast  Result Date: 07/12/2018 CLINICAL DATA:  Seizure. EXAM: CT HEAD WITHOUT CONTRAST TECHNIQUE: Contiguous axial images were obtained from the base of the skull through the vertex without intravenous contrast. COMPARISON:  CT scan of Mar 05, 2017. FINDINGS: Brain: No evidence of acute infarction, hemorrhage, hydrocephalus, extra-axial collection or mass lesion/mass effect. Vascular: No hyperdense vessel or unexpected calcification. Skull: Normal. Negative for fracture or focal lesion. Sinuses/Orbits: No acute finding. Other: None. IMPRESSION: Normal head CT. Electronically Signed   By: Marijo Conception, M.D.   On: 07/12/2018 08:40   Ct Shoulder Right Wo Contrast  Result Date: 07/12/2018 CLINICAL DATA:  Acute onset lateral shoulder pain radiating down the right arm. Patient denies injury. Possible seizure. History of prostate cancer.  EXAM: CT OF THE UPPER RIGHT EXTREMITY WITHOUT CONTRAST TECHNIQUE: Multidetector CT imaging of the upper right extremity was performed according to the standard protocol. COMPARISON:  Right shoulder x-rays from same day. FINDINGS: Bones/Joint/Cartilage There is an acute, impacted fracture of the anteromedial humeral head and lesser tuberosity. Chronic depression of the posterior inferior glenoid. Posterior subluxation of the humeral head. Small joint effusion. Minimal degenerative changes of the acromioclavicular joint. Ligaments Ligaments are suboptimally evaluated by CT. Muscles and Tendons Rotator cuff grossly intact.  No muscle atrophy. Soft tissue No fluid collection or hematoma.  No soft tissue mass. IMPRESSION: 1. Sequelae of prior posterior dislocation with acute, impacted fracture of the anteromedial humeral head and lesser tuberosity, consistent with reverse Hill-Sachs lesion. Posterior subluxation of the humeral head.  2. 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 Electronically Signed   By: Titus Dubin M.D.   On: 07/12/2018 11:03    Procedures Procedures (including critical care time)  Medications Ordered in ED Medications  diazepam (VALIUM) tablet 5 mg (5 mg Oral Given 07/12/18 0908)  sodium chloride 0.9 % bolus 1,000 mL (0 mLs Intravenous Stopped 07/12/18 1110)  oxyCODONE (Oxy IR/ROXICODONE) immediate release tablet 5 mg (5 mg Oral Given 07/12/18 1037)  potassium chloride SA (K-DUR,KLOR-CON) CR tablet 40 mEq (40 mEq Oral Given 07/12/18 1128)     Initial Impression / Assessment and Plan / ED Course  I have reviewed the triage vital signs and the nursing notes.  Pertinent labs & imaging results that were available during my care of the patient were reviewed by me and considered in my medical decision making (see chart for details).     52 year old male with a history of hypertension, alcoholism, polysubstance abuse, prostate cancer who  presents with concern for back pain, shoulder pain, and tongue lacerations.  Patient with strong/equal pulses in upper and lower extremities, have low suspicion for aortic dissection.  No sign of acute spinal compression.  XR shows thoracic compression deformities which are mild, degenerative changes. XR shoulder shows irregularity of humeral neck, possible metastasis/pathologic fracture.  CT shoulder ordered showing sequela of prior posterior dislocation with an acute impacted fracture of the humeral head.  This finding is most consistent with patient likely having seizure with transient posterior shoulder dislocation.  Given tongue abrasions present, suspect patient most likely had a seizure, and his back and shoulder pain is secondary to pain from his seizure.  He does have a history of nontraumatic rhabdomyolysis in the past, and given muscle pain, CK was ordered and 663 (in past was 3000s).  Given concern for first time seizure, CT head was done and was WNL.  Electrolytes WNL, Cr worse than previous one year ago, given IV fluids.  Patient denies possibility of alcohol withdrawal, reports still drinking and no hx of it, no sign of alcohol withdrawal on history or physical. (has slight htn and reports not taking medications yet today.)  For humeral fracture, patient was placed in a shoulder sling, recommend orthopedic follow-up.  Recommend follow-up with oncologist and urologist given hx of prostate Ca with poor follow up--and also recommend outpatient MRI given history of prostate cancer and concern for first-time seizure.  Patient was given number for neurology follow-up, and recommend orthopedic and oncology follow up. Patient discharged in stable condition with understanding of reasons to return.    Final Clinical Impressions(s) / ED Diagnoses   Final diagnoses:  Seizure ([place])  Acute pain of right shoulder  Acute bilateral thoracic back pain  Abrasion of tongue, initial encounter  Closed  fracture of head of right humerus, initial encounter    ED Discharge Orders         Ordered    oxyCODONE (ROXICODONE) 5 MG immediate release tablet  Every 6 hours PRN,   Status:  Discontinued     07/12/18 1119    oxyCODONE (ROXICODONE) 5 MG immediate release tablet  Every 6 hours PRN     07/12/18 1123           Gareth Morgan, MD 07/12/18 1550

## 2018-07-12 NOTE — ED Notes (Signed)
Pt discharged from ED; instructions provided and scripts given; Pt encouraged to return to ED if symptoms worsen and to f/u with PCP; Pt verbalized understanding of all instructions 

## 2018-07-12 NOTE — Discharge Instructions (Signed)
Take Tylenol (acetaminophen) 1000 mg 4 times a day for 1 week. This is the maximum dose of Tylenol you can take from all sources. Please check other over-the-counter medications and prescriptions to ensure you are not taking other medications that contain acetaminophen.  You may also take ibuprofen 400 mg 6 times a day alternating with or at the same time as tylenol.  Take oxycodone as needed for breakthrough pain.  This medication can be addicting, sedating and cause constipation.

## 2018-07-12 NOTE — ED Triage Notes (Signed)
Pt c/o generalized back pain and tightness that started last night. Denies injury/trauma. Has been taking tylenol with no relief.

## 2018-09-07 ENCOUNTER — Encounter (HOSPITAL_COMMUNITY): Payer: Self-pay | Admitting: Emergency Medicine

## 2018-09-07 ENCOUNTER — Other Ambulatory Visit: Payer: Self-pay

## 2018-09-07 ENCOUNTER — Emergency Department (HOSPITAL_COMMUNITY): Payer: Self-pay

## 2018-09-07 ENCOUNTER — Emergency Department (HOSPITAL_COMMUNITY)
Admission: EM | Admit: 2018-09-07 | Discharge: 2018-09-07 | Disposition: A | Discharge status: Home / Self Care | Payer: Self-pay | Attending: Emergency Medicine | Admitting: Emergency Medicine

## 2018-09-07 DIAGNOSIS — F17210 Nicotine dependence, cigarettes, uncomplicated: Secondary | ICD-10-CM | POA: Insufficient documentation

## 2018-09-07 DIAGNOSIS — I10 Essential (primary) hypertension: Secondary | ICD-10-CM | POA: Insufficient documentation

## 2018-09-07 DIAGNOSIS — Z79899 Other long term (current) drug therapy: Secondary | ICD-10-CM | POA: Insufficient documentation

## 2018-09-07 DIAGNOSIS — Z8546 Personal history of malignant neoplasm of prostate: Secondary | ICD-10-CM | POA: Insufficient documentation

## 2018-09-07 DIAGNOSIS — R0602 Shortness of breath: Secondary | ICD-10-CM | POA: Insufficient documentation

## 2018-09-07 DIAGNOSIS — R569 Unspecified convulsions: Secondary | ICD-10-CM | POA: Insufficient documentation

## 2018-09-07 DIAGNOSIS — M25511 Pain in right shoulder: Secondary | ICD-10-CM | POA: Insufficient documentation

## 2018-09-07 LAB — CBC WITH DIFFERENTIAL/PLATELET
Abs Immature Granulocytes: 0.18 10*3/uL — ABNORMAL HIGH (ref 0.00–0.07)
BASOS ABS: 0 10*3/uL (ref 0.0–0.1)
Basophils Relative: 0 %
EOS ABS: 0.1 10*3/uL (ref 0.0–0.5)
Eosinophils Relative: 1 %
HEMATOCRIT: 54.2 % — AB (ref 39.0–52.0)
Hemoglobin: 16.7 g/dL (ref 13.0–17.0)
IMMATURE GRANULOCYTES: 2 %
LYMPHS ABS: 2.6 10*3/uL (ref 0.7–4.0)
Lymphocytes Relative: 27 %
MCH: 30 pg (ref 26.0–34.0)
MCHC: 30.8 g/dL (ref 30.0–36.0)
MCV: 97.5 fL (ref 80.0–100.0)
Monocytes Absolute: 1.1 10*3/uL — ABNORMAL HIGH (ref 0.1–1.0)
Monocytes Relative: 11 %
NRBC: 0 % (ref 0.0–0.2)
Neutro Abs: 5.8 10*3/uL (ref 1.7–7.7)
Neutrophils Relative %: 59 %
PLATELETS: 317 10*3/uL (ref 150–400)
RBC: 5.56 MIL/uL (ref 4.22–5.81)
RDW: 13.1 % (ref 11.5–15.5)
WBC: 9.8 10*3/uL (ref 4.0–10.5)

## 2018-09-07 LAB — COMPREHENSIVE METABOLIC PANEL
ALBUMIN: 4.2 g/dL (ref 3.5–5.0)
ALT: 26 U/L (ref 0–44)
AST: 56 U/L — AB (ref 15–41)
Alkaline Phosphatase: 417 U/L — ABNORMAL HIGH (ref 38–126)
Anion gap: 21 — ABNORMAL HIGH (ref 5–15)
BILIRUBIN TOTAL: 1.1 mg/dL (ref 0.3–1.2)
BUN: 7 mg/dL (ref 6–20)
CHLORIDE: 101 mmol/L (ref 98–111)
CO2: 14 mmol/L — ABNORMAL LOW (ref 22–32)
Calcium: 9.9 mg/dL (ref 8.9–10.3)
Creatinine, Ser: 1.37 mg/dL — ABNORMAL HIGH (ref 0.61–1.24)
GFR calc Af Amer: >60 mL/min (ref >60)
GFR calc non Af Amer: 58 mL/min — ABNORMAL LOW (ref >60)
GLUCOSE: 155 mg/dL — AB (ref 70–99)
POTASSIUM: 3.3 mmol/L — AB (ref 3.5–5.1)
SODIUM: 136 mmol/L (ref 135–145)
Total Protein: 8.1 g/dL (ref 6.5–8.1)

## 2018-09-07 LAB — I-STAT ARTERIAL BLOOD GAS, ED
Acid-base deficit: 10 mmol/L — ABNORMAL HIGH (ref 0.0–2.0)
Bicarbonate: 14.6 mmol/L — ABNORMAL LOW (ref 20.0–28.0)
O2 Saturation: 100 %
PCO2 ART: 28.6 mmHg — AB (ref 32.0–48.0)
Patient temperature: 97
TCO2: 15 mmol/L — ABNORMAL LOW (ref 22–32)
pH, Arterial: 7.311 — ABNORMAL LOW (ref 7.350–7.450)
pO2, Arterial: 245 mmHg — ABNORMAL HIGH (ref 83.0–108.0)

## 2018-09-07 LAB — RAPID URINE DRUG SCREEN, HOSP PERFORMED
AMPHETAMINES: NOT DETECTED
Barbiturates: NOT DETECTED
Benzodiazepines: NOT DETECTED
Cocaine: NOT DETECTED
OPIATES: NOT DETECTED
Tetrahydrocannabinol: POSITIVE — AB

## 2018-09-07 LAB — I-STAT TROPONIN, ED: Troponin i, poc: 0.01 ng/mL (ref 0.00–0.08)

## 2018-09-07 LAB — PROTIME-INR
INR: 1.06
Prothrombin Time: 13.7 seconds (ref 11.4–15.2)

## 2018-09-07 MED ORDER — DIAZEPAM 10 MG RE GEL: 10.0000 mg | Freq: Once | RECTAL | 0 refills | Status: DC | PRN

## 2018-09-07 MED ORDER — LEVETIRACETAM 500 MG PO TABS: 500.0000 mg | ORAL_TABLET | Freq: Two times a day (BID) | ORAL | 3 refills | Status: DC

## 2018-09-07 MED ORDER — LEVETIRACETAM 500 MG PO TABS
500.0000 mg | ORAL_TABLET | Freq: Once | ORAL | Status: AC
  Administered 2018-09-07: 500 mg via ORAL
  Filled 2018-09-07: qty 1

## 2018-09-07 NOTE — Progress Notes (Signed)
RT NOTE:  Pt transported to CT and back to ER without event 

## 2018-09-07 NOTE — Discharge Instructions (Signed)
Please avoid using any drugs and alcohol as this will make you more likely to have seizures.  Seek help with your alcohol problem with 1 of the resources provided.  Please schedule follow-up with 1 of the listed neurology groups for further work-up.  Return to the ER for any further problems.
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
## 2018-09-07 NOTE — ED Triage Notes (Signed)
[facility] reports the pt's wife woke up to her husband having a seizure. EMS reports the pt had had his first seizure back in October and is not currently taking any anti-seizure meds. EMS reports the pt was combative, diaphoretic, and confused. EMS reports rales upon auscultation. EMS reports room air oxygen saturation of 79%. EMS placed pt on CPAP with 10 of PEEP and he was satting at 100%. EMS reports unremarkable 12 lead. EMS started an 18g saline lock in pt's left ac.   Pt is still confused. Unable to tell what happened. Only complains of right shoulder pain.

## 2018-09-07 NOTE — Progress Notes (Signed)
RT NOTE:  Pt titrated from BIPAP to 4L [name]. Pt tolerating well at this time. No SOB. Call bell within reach.

## 2018-09-10 ENCOUNTER — Encounter: Payer: Self-pay | Admitting: Neurology

## 2018-09-12 ENCOUNTER — Encounter: Payer: Self-pay | Admitting: Neurology

## 2018-09-12 ENCOUNTER — Ambulatory Visit: Payer: Self-pay | Admitting: Neurology

## 2018-09-12 VITALS — BP 149/94 | HR 75 | Ht 73.0 in | Wt 229.0 lb

## 2018-09-12 DIAGNOSIS — F1029 Alcohol dependence with unspecified alcohol-induced disorder: Secondary | ICD-10-CM

## 2018-09-12 DIAGNOSIS — F1910 Other psychoactive substance abuse, uncomplicated: Secondary | ICD-10-CM | POA: Insufficient documentation

## 2018-09-12 DIAGNOSIS — J449 Chronic obstructive pulmonary disease, unspecified: Secondary | ICD-10-CM | POA: Insufficient documentation

## 2018-09-12 DIAGNOSIS — G40909 Epilepsy, unspecified, not intractable, without status epilepticus: Secondary | ICD-10-CM | POA: Insufficient documentation

## 2018-09-12 MED ORDER — LEVETIRACETAM 500 MG PO TABS: 500.0000 mg | ORAL_TABLET | Freq: Two times a day (BID) | ORAL | 11 refills | Status: DC

## 2018-09-12 NOTE — Patient Instructions (Addendum)
Per [place] law, no driving for 2-02 months Patient is unable to drive, operate heavy machinery, perform activities at heights or participate in water activities until 6 months seizure free Continue Keppra  Seizure, Adult When you have a seizure:  Parts of your body may move.  How aware or awake (conscious) you are may change.  You may shake (convulse).  Some people have symptoms right before a seizure happens. These symptoms may include:  Fear.  Worry (anxiety).  Feeling like you are going to throw up (nausea).  Feeling like the room is spinning (vertigo).  Feeling like you saw or heard something before (deja vu).  Odd tastes or smells.  Changes in vision, such as seeing flashing lights or spots.  Seizures usually last from 30 seconds to 2 minutes. Usually, they are not harmful unless they last a long time. Follow these instructions at home: Medicines  Take over-the-counter and prescription medicines only as told by your doctor.  Avoid anything that may keep your medicine from working, such as alcohol. Activity  Do not do any activities that would be dangerous if you had another seizure, like driving or swimming. Wait until your doctor approves.  If you live in the [place] ask your local DMV (department of motor vehicles) when you can drive.  Rest. Teaching others  Teach friends and family what to do when you have a seizure. They should: ? Lay you on the ground. ? Protect your head and body. ? Loosen any tight clothing around your neck. ? Turn you on your side. ? Stay with you until you are better. ? Not hold you down. ? Not put anything in your mouth. ? Know whether or not you need emergency care. General instructions  Contact your doctor each time you have a seizure.  Avoid anything that gives you seizures.  Keep a seizure diary. Write down: ? What you think caused each seizure. ? What you remember about each seizure.  Keep all follow-up visits as told by  your doctor. This is important. Contact a doctor if:  You have another seizure.  You have seizures more often.  There is any change in what happens during your seizures.  You continue to have seizures with treatment.  You have symptoms of being sick or having an infection. Get help right away if:  You have a seizure: ? That lasts longer than 5 minutes. ? That is different than seizures you had before. ? That makes it harder to breathe. ? After you hurt your head.  After a seizure, you cannot speak or use a part of your body.  After a seizure, you are confused or have a bad headache.  You have two or more seizures in a row.  You are having seizures more often.  You do not wake up right after a seizure.  You get hurt during a seizure. In an emergency:  These symptoms may be an emergency. Do not wait to see if the symptoms will go away. Get medical help right away. Call your local emergency services (911 in the [place] Do not drive yourself to the hospital. This information is not intended to replace advice given to you by your health care provider. Make sure you discuss any questions you have with your health care provider. Document Released: 04/03/2008 Document Revised: 06/28/2016 Document Reviewed: 06/28/2016 Elsevier Interactive Patient Education  2017 [employer].  Alcohol Abuse and Nutrition Alcohol abuse is any pattern of alcohol consumption that harms your health, relationships,  or work. Alcohol abuse can affect how your body breaks down and absorbs nutrients from food by causing your liver to work abnormally. Additionally, many people who abuse alcohol do not eat enough carbohydrates, protein, fat, vitamins, and minerals. This can cause poor nutrition (malnutrition) and a lack of nutrients (nutrient deficiencies), which can lead to further complications. Nutrients that are commonly lacking (deficient) among people who abuse alcohol include:  Vitamins. ? Vitamin A.  This is stored in your liver. It is important for your vision, metabolism, and ability to fight off infections (immunity). ? B vitamins. These include vitamins such as folate, thiamin, and niacin. These are important in new cell growth and maintenance. ? Vitamin C. This plays an important role in iron absorption, wound healing, and immunity. ? Vitamin D. This is produced by your liver, but you can also get vitamin D from food. Vitamin D is necessary for your body to absorb and use calcium.  Minerals. ? Calcium. This is important for your bones and your heart and blood vessel (cardiovascular) function. ? Iron. This is important for blood, muscle, and nervous system functioning. ? Magnesium. This plays an important role in muscle and nerve function, and it helps to control blood sugar and blood pressure. ? Zinc. This is important for the normal function of your nervous system and digestive system (gastrointestinal tract).  Nutrition is an essential component of therapy for alcohol abuse. Your health care provider or dietitian will work with you to design a plan that can help restore nutrients to your body and prevent potential complications. What is my plan? Your dietitian may develop a specific diet plan that is based on your condition and any other complications you may have. A diet plan will commonly include:  A balanced diet. ? Grains: 6-8 oz per day. ? Vegetables: 2-3 cups per day. ? Fruits: 1-2 cups per day. ? Meat and other protein: 5-6 oz per day. ? Dairy: 2-3 cups per day.  Vitamin and mineral supplements.  What do I need to know about alcohol and nutrition?  Consume foods that are high in antioxidants, such as grapes, berries, nuts, green tea, and dark green and orange vegetables. This can help to counteract some of the stress that is placed on your liver by consuming alcohol.  Avoid food and drinks that are high in fat and sugar. Foods such as sugared soft drinks, salty snack  foods, and candy contain empty calories. This means that they lack important nutrients such as protein, fiber, and vitamins.  Eat frequent meals and snacks. Try to eat 5-6 small meals each day.  Eat a variety of fresh fruits and vegetables each day. This will help you get plenty of water, fiber, and vitamins in your diet.  Drink plenty of water and other clear fluids. Try to drink at least 48-64 oz (1.5-2 L) of water per day.  If you are a vegetarian, eat a variety of protein-rich foods. Pair whole grains with plant-based proteins at meals and snacks to obtain the greatest nutrient benefit from your food. For example, eat rice with beans, put peanut butter on whole-grain toast, or eat oatmeal with sunflower seeds.  Soak beans and whole grains overnight before cooking. This can help your body to absorb the nutrients more easily.  Include foods fortified with vitamins and minerals in your diet. Commonly fortified foods include milk, orange juice, cereal, and bread.  If you are malnourished, your dietitian may recommend a high-protein, high-calorie diet. This may include: ?  2,000-3,000 calories (kilocalories) per day. ? 70-100 grams of protein per day.  Your health care provider may recommend a complete nutritional supplement beverage. This can help to restore calories, protein, and vitamins to your body. Depending on your condition, you may be advised to consume this instead of or in addition to meals.  Limit your intake of caffeine. Replace drinks like coffee and black tea with decaffeinated coffee and herbal tea.  Eat a variety of foods that are high in omega fatty acids. These include fish, nuts and seeds, and soybeans. These foods may help your liver to recover and may also stabilize your mood.  Certain medicines may cause changes in your appetite, taste, and weight. Work with your health care provider and dietitian to make any adjustments to your medicines and diet plan.  Include other  healthy lifestyle choices in your daily routine. ? Be physically active. ? Get enough sleep. ? Spend time doing activities that you enjoy.  If you are unable to take in enough food and calories by mouth, your health care provider may recommend a feeding tube. This is a tube that passes through your nose and throat, directly into your stomach. Nutritional supplement beverages can be given to you through the feeding tube to help you get the nutrients you need.  Take vitamin or mineral supplements as recommended by your health care provider. What foods can I eat? Grains Enriched pasta. Enriched rice. Fortified whole-grain bread. Fortified whole-grain cereal. Barley. Brown rice. Quinoa. [place]. Vegetables All fresh, frozen, and canned vegetables. Spinach. Kale. Artichoke. Carrots. Winter squash and pumpkin. Sweet potatoes. Broccoli. Cabbage. Cucumbers. Tomatoes. Sweet peppers. Green beans. Peas. Corn. Fruits All fresh and frozen fruits. Berries. Grapes. Mango. Papaya. Guava. Cherries. Apples. Bananas. Peaches. Plums. Pineapple. Watermelon. Cantaloupe. Oranges. Avocado. Meats and Other Protein Sources Beef liver. Lean beef. Pork. Fresh and canned chicken. Fresh fish. Oysters. Sardines. Canned tuna. Shrimp. Eggs with yolks. Nuts and seeds. Peanut butter. Beans and lentils. Soybeans. Tofu. Dairy Whole, low-fat, and nonfat milk. Whole, low-fat, and nonfat yogurt. Cottage cheese. Sour cream. Hard and soft cheeses. Beverages Water. Herbal tea. Decaffeinated coffee. Decaffeinated green tea. 100% fruit juice. 100% vegetable juice. Instant breakfast shakes. Condiments Ketchup. Mayonnaise. Mustard. Salad dressing. Barbecue sauce. Sweets and Desserts Sugar-free ice cream. Sugar-free pudding. Sugar-free gelatin. Fats and Oils Butter. Vegetable oil, flaxseed oil, olive oil, and walnut oil. Other Complete nutrition shakes. Protein bars. Sugar-free gum. The items listed above may not be a complete list of  recommended foods or beverages. Contact your dietitian for more options. What foods are not recommended? Grains Sugar-sweetened breakfast cereals. Flavored instant oatmeal. Fried breads. Vegetables Breaded or deep-fried vegetables. Fruits Dried fruit with added sugar. Candied fruit. Canned fruit in syrup. Meats and Other Protein Sources Breaded or deep-fried meats. Dairy Flavored milks. Fried cheese curds or fried cheese sticks. Beverages Alcohol. Sugar-sweetened soft drinks. Sugar-sweetened tea. Caffeinated coffee and tea. Condiments Sugar. Honey. Agave nectar. Molasses. Sweets and Desserts Chocolate. Cake. Cookies. Candy. Other Potato chips. Pretzels. Salted nuts. Candied nuts. The items listed above may not be a complete list of foods and beverages to avoid. Contact your dietitian for more information. This information is not intended to replace advice given to you by your health care provider. Make sure you discuss any questions you have with your health care provider. Document Released: 08/10/2005 Document Revised: 02/23/2016 Document Reviewed: 05/19/2014 Elsevier Interactive Patient Education  2018 [employer].    Alcohol Use Disorder Alcohol use disorder is when your drinking  disrupts your daily life. When you have this condition, you drink too much alcohol and you cannot control your drinking. Alcohol use disorder can cause serious problems with your physical health. It can affect your brain, heart, liver, pancreas, immune system, stomach, and intestines. Alcohol use disorder can increase your risk for certain cancers and cause problems with your mental health, such as depression, anxiety, psychosis, delirium, and dementia. People with this disorder risk hurting themselves and others. What are the causes? This condition is caused by drinking too much alcohol over time. It is not caused by drinking too much alcohol only one or two times. Some people with this condition drink  alcohol to cope with or escape from negative life events. Others drink to relieve pain or symptoms of mental illness. What increases the risk? You are more likely to develop this condition if:  You have a family history of alcohol use disorder.  Your culture encourages drinking to the point of intoxication, or makes alcohol easy to get.  You had a mood or conduct disorder in childhood.  You have been a victim of abuse.  You are an adolescent and: ? You have poor grades or difficulties in school. ? Your caregivers do not talk to you about saying no to alcohol, or supervise your activities. ? You are impulsive or you have trouble with self-control.  What are the signs or symptoms? Symptoms of this condition include:  Drinkingmore than you want to.  Drinking for longer than you want to.  Trying several times to drink less or to control your drinking.  Spending a lot of time getting alcohol, drinking, or recovering from drinking.  Craving alcohol.  Having problems at work, at school, or at home due to drinking.  Having problems in relationships due to drinking.  Drinking when it is dangerous to drink, such as before driving a car.  Continuing to drink even though you know you might have a physical or mental problem related to drinking.  Needing more and more alcohol to get the same effect you want from the alcohol (building up tolerance).  Having symptoms of withdrawal when you stop drinking. Symptoms of withdrawal include: ? Fatigue. ? Nightmares. ? Trouble sleeping. ? Depression. ? Anxiety. ? Fever. ? Seizures. ? Severe confusion. ? Feeling or seeing things that are not there (hallucinations). ? Tremors. ? Rapid heart rate. ? Rapid breathing. ? High blood pressure.  Drinking to avoid symptoms of withdrawal.  How is this diagnosed? This condition is diagnosed with an assessment. Your health care provider may start the assessment by asking three or four  questions about your drinking. Your health care provider may perform a physical exam or do lab tests to see if you have physical problems resulting from alcohol use. She or he may refer you to a mental health professional for evaluation. How is this treated? Some people with alcohol use disorder are able to reduce their alcohol use to low-risk levels. Others need to completely quit drinking alcohol. When necessary, mental health professionals with specialized training in substance use treatment can help. Your health care provider can help you decide how severe your alcohol use disorder is and what type of treatment you need. The following forms of treatment are available:  Detoxification. Detoxification involves quitting drinking and using prescription medicines within the first week to help lessen withdrawal symptoms. This treatment is important for people who have had withdrawal symptoms before and for heavy drinkers who are likely to have withdrawal symptoms.  Alcohol withdrawal can be dangerous, and in severe cases, it can cause death. Detoxification may be provided in a home, community, or primary care setting, or in a hospital or substance use treatment facility.  Counseling. This treatment is also called talk therapy. It is provided by substance use treatment counselors. A counselor can address the reasons you use alcohol and suggest ways to keep you from drinking again or to prevent problem drinking. The goals of talk therapy are to: ? Find healthy activities and ways for you to cope with stress. ? Identify and avoid the things that trigger your alcohol use. ? Help you learn how to handle cravings.  Medicines.Medicines can help treat alcohol use disorder by: ? Decreasing alcohol cravings. ? Decreasing the positive feeling you have when you drink alcohol. ? Causing an uncomfortable physical reaction when you drink alcohol (aversion therapy).  Support groups. Support groups are led by people  who have quit drinking. They provide emotional support, advice, and guidance.  These forms of treatment are often combined. Some people with this condition benefit from a combination of treatments provided by specialized substance use treatment centers. Follow these instructions at home:  Take over-the-counter and prescription medicines only as told by your health care provider.  Check with your health care provider before starting any new medicines.  Ask friends and family members not to offer you alcohol.  Avoid situations where alcohol is served, including gatherings where others are drinking alcohol.  Create a plan for what to do when you are tempted to use alcohol.  Find hobbies or activities that you enjoy that do not include alcohol.  Keep all follow-up visits as told by your health care provider. This is important. How is this prevented?  If you drink, limit alcohol intake to no more than 1 drink a day for nonpregnant women and 2 drinks a day for men. One drink equals 12 oz of beer, 5 oz of wine, or 1 oz of hard liquor.  If you have a mental health condition, get treatment and support.  Do not give alcohol to adolescents.  If you are an adolescent: ? Do not drink alcohol. ? Do not be afraid to say no if someone offers you alcohol. Speak up about why you do not want to drink. You can be a positive role model for your friends and set a good example for those around you by not drinking alcohol. ? If your friends drink, spend time with others who do not drink alcohol. Make new friends who do not use alcohol. ? Find healthy ways to manage stress and emotions, such as meditation or deep breathing, exercise, spending time in nature, listening to music, or talking with a trusted friend or family member. Contact a health care provider if:  You are not able to take your medicines as told.  Your symptoms get worse.  You return to drinking alcohol (relapse) and your symptoms get  worse. Get help right away if:  You have thoughts about hurting yourself or others. If you ever feel like you may hurt yourself or others, or have thoughts about taking your own life, get help right away. You can go to your nearest emergency department or call:  Your local emergency services (911 in the [place]  A suicide crisis helpline, such as the [facility] at (361)181-3982. This is open 24 hours a day.  Summary  Alcohol use disorder is when your drinking disrupts your daily life. When you have this  condition, you drink too much alcohol and you cannot control your drinking.  Treatment may include detoxification, counseling, medicine, and support groups.  Ask friends and family members not to offer you alcohol. Avoid situations where alcohol is served.  Get help right away if you have thoughts about hurting yourself or others. This information is not intended to replace advice given to you by your health care provider. Make sure you discuss any questions you have with your health care provider. Document Released: 11/23/2004 Document Revised: 07/13/2016 Document Reviewed: 07/13/2016 Elsevier Interactive Patient Education  [employer].

## 2018-09-12 NOTE — Progress Notes (Signed)
[facility]    Provider:  Dr Jaynee Eagles Referring Provider: ED Primary Care Physician:  Patient, No Pcp Per  CC:  seizure  HPI:  Roy Rosales is a 52 y.o. male here as requested by the ED seizure.  Past medical history hypertension, alcoholism, COPD,  polysubstance abuse, prostate cancer.  Patient has been seen at the emergency room multiple times in September for back pain as well as tongue lacerations.  More recently this month for seizures. Back in September he woke up with a swollen tongue and pain and went to the ED diagnosed with a seizure. Recently in sleep he had GTCS and brought to the ED with postictal confusion. He drinks about 16oz bottles and he drinks 6 beers a day or maybe more and he mixes it with liquor. He also took pcp and hasn't used for 3 months. Discussed cessation but also warned he can't just stop and discussed withdrawal and DTs and death. NO Fhx seizures, no prior seizures before September. He has poor care overall.No other focal neurologic deficits, associated symptoms, inciting events or modifiable factors.  Reviewed notes, labs and imaging from outside physicians, which showed:  CT head 2019 showed No acute intracranial abnormalities including mass lesion or mass effect, hydrocephalus, extra-axial fluid collection, midline shift, hemorrhage, or acute infarction, large ischemic events (personally reviewed images)  Reviewed notes from the ED.  Patient was seen in September of this year for back pain and arm pain and laceration of the tongue.  He woke up and felt like his tongue was swollen.  At that time he reported 10 beers daily.  Denied a history of alcohol withdrawal or alcohol withdrawal seizures.  Reports smoking "refer" but denies any other drug use.  At that time he denied any history of seizures.  Also medication noncompliance.  The ED suspected a seizure.  CK in the ED was 663 in the past was in the 3000s.  CT of the head was normal at that  visit.  At that time patient reported he was still drinking and denied alcohol withdrawal.  In November this month earlier patient was brought in in respiratory distress.  He was awakened with a generalized tonic-clonic seizure in his sleep.  He was agitated and confused afterward.  His saturation was 79%.  He was still confused in the ED could not remember how he got there or what happened.  He reported that he did drink less yesterday than usual.  Dr. Malen Gauze did recommend that he be started on antiepileptics.  Initiated Keppra. Review of Systems: Patient complains of symptoms per HPI as well as the following symptoms: memory loss, weakness, sleepiness, seizure. Pertinent negatives and positives per HPI. All others negative.   Social History   Socioeconomic History  . Marital status: Legally Separated    Spouse name: Not on file  . Number of children: 1  . Years of education: Not on file  . Highest education level: GED or equivalent  Occupational History  . Occupation: concrete work  Scientific laboratory technician  . Financial resource strain: Not on file  . Food insecurity:    Worry: Not on file    Inability: Not on file  . Transportation needs:    Medical: Not on file    Non-medical: Not on file  Tobacco Use  . Smoking status: Current Every Day Smoker    Packs/day: 1.00    Types: Cigarettes  . Smokeless tobacco: Never Used  Substance and Sexual Activity  . Alcohol  use: Yes    Comment: 6-12+ beer/day; update 09/12/18 none since 09/07/18, plans to quit, was drinking 5-6 beers per day  . Drug use: Yes    Types: Marijuana, PCP    Comment: PCP for about 3 months, none since 2017 or 2018  . Sexual activity: Yes  Lifestyle  . Physical activity:    Days per week: Not on file    Minutes per session: Not on file  . Stress: Not on file  Relationships  . Social connections:    Talks on phone: Not on file    Gets together: Not on file    Attends religious service: Not on file    Active member of  club or organization: Not on file    Attends meetings of clubs or organizations: Not on file    Relationship status: Not on file  . Intimate partner violence:    Fear of current or ex partner: Not on file    Emotionally abused: Not on file    Physically abused: Not on file    Forced sexual activity: Not on file  Other Topics Concern  . Not on file  Social History Narrative   Lives at home with his mother    Right handed    Caffeine: 1 cup in the mornings    Family History  Problem Relation Age of Onset  . Breast cancer Mother   . Hypertension Mother   . Cancer Sister   . Prostate cancer Father   . Prostate cancer Brother   . Stomach cancer Brother     Past Medical History:  Diagnosis Date  . Hypertension   . Polysubstance abuse ([place])   . Prostate cancer [facility])     Past Surgical History:  Procedure Laterality Date  . NO PAST SURGERIES      Current Outpatient Medications  Medication Sig Dispense Refill  . atenolol (TENORMIN) 100 MG tablet Take 100 mg by mouth daily.    . diazepam (DIASTAT ACUDIAL) 10 MG GEL Place 10 mg rectally once as needed for up to 1 dose for seizure. 1 Package 0  . levETIRAcetam (KEPPRA) 500 MG tablet Take 1 tablet (500 mg total) by mouth 2 (two) times daily. 60 tablet 3  . triamterene-hydrochlorothiazide (MAXZIDE) 75-50 MG tablet Take 1 tablet by mouth daily.    Marland Kitchen EPINEPHrine 0.3 mg/0.3 mL IJ SOAJ injection Inject 0.3 mLs (0.3 mg total) into the muscle once as needed (severe allergic reaction, difficulty swallowing or breathing). (Patient not taking: Reported on 09/12/2018) 1 Device 1  . ibuprofen (ADVIL,MOTRIN) 800 MG tablet Take 800 mg by mouth as needed.     No current facility-administered medications for this visit.     Allergies as of 09/12/2018  . (No Known Allergies)    Vitals: BP (!) 149/94 (BP Location: Left Arm, Patient Position: Sitting)   Pulse 75   Ht 6\' 1"  (1.854 m)   Wt 229 lb (103.9 kg)   BMI 30.21 kg/m  Last Weight:    Wt Readings from Last 1 Encounters:  09/12/18 229 lb (103.9 kg)   Last Height:   Ht Readings from Last 1 Encounters:  09/12/18 6\' 1"  (1.854 m)    Physical exam: Exam: Gen: NAD, conversant, well nourised, obese, well groomed                     CV: RRR, no MRG. No Carotid Bruits. No peripheral edema, warm, nontender Eyes: Conjunctivae clear without exudates or hemorrhage  Neuro: Detailed Neurologic Exam  Speech:    Speech is normal; fluent and spontaneous with normal comprehension.  Cognition:    The patient is oriented to person, place, and time;     recent and remote memory intact;     language fluent;     normal attention, concentration,     fund of knowledge Cranial Nerves:    The pupils are equal, round, and reactive to light. Fundoscopy attempted could not visualize. . Visual fields are full to finger confrontation. Extraocular movements are intact. Trigeminal sensation is intact and the muscles of mastication are normal. Mild left ptosis.  The palate elevates in the midline. Hearing intact. Voice is normal. Shoulder shrug is normal. The tongue has normal motion without fasciculations.   Coordination:    Normal finger to nose and heel to shin,(deferred right arm due to pain, injured during seizure)  Gait:    Heel-toe gait are normal.  mild imbalance with tandem  Motor Observation:    No asymmetry, no atrophy, and no involuntary movements noted. Tone:    Normal muscle tone.    Posture:    Posture is normal. normal erect    Strength:    Strength is V/V in the upper and lower limbs. (deferred right arm due to pain, injured during seizure)     Sensation: intact to LT     Reflex Exam:  DTR's:    Deep tendon reflexes in the upper and lower extremities are symmetricbilaterally.   Toes:    The toes are downgoing bilaterally.   Clonus:    Clonus is absent.        Assessment/Plan:   52 y.o. male here as requested by the ED seizure.  Past medical history  hypertension, alcoholism, COPD,  polysubstance abuse, prostate cancer.  Drinks >6 16oz beers daily and sometimes mixes it with liquor. Smokes THC. Last pcp use was 3 months ago. Discussed cessation but also warned he can't just stop and discussed withdrawal and DTs and death. Needs an inpatient facility.  Seizures may be alcoholism or drug related. Continue Keppra at this time.  Recommend MRI of the brain, eeg, labwork he declines as he is self pay. Gave him info and instructions on [facility] financial assistance. If he received financial assistance he will call to let us know.  He has poor care overall. Discussed [facility] financial assistance and that he needs regular care  Patient is unable to drive, operate heavy machinery, perform activities at heights or participate in water activities until 6 months seizure free. Emphasized this. Please inquire on your local laws in your county  Seizure precautions.  Discussed: Seizures and Epilepsy  What is a seizure? A brief, strong surge of abnormal electrical activity affects part or all of the brain, which leads to transient symptoms and signs from convulsions and loss of consciousness to more subtle symptoms such as blank staring, lip smacking, or jerking movements of arms and legs. It can occur due to provoking factors, such as fever, infection, alcohol, drugs, certain medications, or other medical conditions.  What is Epilepsy? Chronic/two or more recurrent unprovoked seizures from an underlying neurologic condition.  First Aid for Seizures When providing seizure first aid for generalized tonic clonic (grand mal) seizures, these are the key things to remember: ? Keep calm and reassure other people who may be nearby.  ? Don't hold the person down or try to stop his movements.  ? Time the seizure with your watch.  ? Clear the area  around the person of anything hard or sharp.  ? Loosen ties or anything around the neck that may make breathing difficult.    ? Put something flat and soft, like a folded jacket, under the head.  ? Turn him or her gently onto one side. This will help keep the airway clear.  ? Do not try to force the mouth open with any hard implement or with fingers. A person having a seizure CANNOT swallow his tongue. Efforts to hold the tongue down can injure teeth or jaw.  ? Don't attempt artificial respiration except in the unlikely event that a person does not start breathing again after the seizure has stopped.  ? Stay with the person until the seizure ends naturally.  ? Be friendly and reassuring as consciousness returns.  ? Offer to call a taxi, friend or relative to help the person get home if he seems confused or unable to get home by himself.   Is an Emergency Room Visit Needed? An un-complicated generalized tonic clonic (grand mal) seizure in someone who has epilepsy is not a medical emergency, even though it looks like one. It stops naturally after a few minutes without ill effects. The average person is able to continue about his business after a rest period, and may need only limited assistance, or no assistance at all, in getting home. In other circumstances, an ambulance should be called. Also, when these conditions exist, immediate medical attention is necessary: diabetes, brain infections, heat exhaustion, pregnancy, poisoning, hypoglycemia, high fever, head injury, etc.  No Need to Call an Ambulance if - ? medical I.D. jewelry or card says "epilepsy," and  ? the seizure ends in under five minutes, and  ? consciousness returns without further incident, and  ? there are no signs of injury, physical distress, or pregnancy.   An Ambulance Should Be Called if - ? the seizure has happened in water.  ? there's no medical I.D., and no way of knowing whether the seizure is caused by epilepsy.  ? the person is pregnant, injured, or diabetic.  ? the seizure continues for more than five minutes.  ? a second seizure starts  shortly after the first has ended.  ? consciousness does not start to return after the shaking has stopped.  ? the person has been injured as a result of the seizure   If the ambulance arrives after consciousness has returned, the person should be asked whether the seizure was associated with epilepsy and whether emergency room care is wanted.  Department of Motor Vehicle [facility]) of [place] regulations for seizures - It is the patient's responsibility to report the incidence of the seizure in the state of [place]. [place] has no statutory provision requiring physicians to report patients diagnosed with epilepsy or seizures to a central state agency.  The recommended requirement for a driver's license in [place] for an individual with epilepsy is that they be seizure-free for 6-12 months. However, the DMV may consider certain exceptions to this general rule. The Department learns of an individual's condition by inquiring on the application form or renewal form, a physician's report to the [facility], an accident report or from correspondence from the individual. The person may be required to submit a Medical Report Form either annually or semi-annually.  A person whose license has been denied for medical reasons may request a hearing in writing within 10 days of the notice of cancellation. The licensee may retain his or her license during the hearing process  until advised otherwise by the Review Board.  Epilepsy Website for More Information  [email or web address]  [facility] [address] [place], [place] [address]  Phone [phone] Fax [phone]

## 2018-11-20 ENCOUNTER — Encounter: Payer: Self-pay | Admitting: Critical Care Medicine

## 2018-11-20 ENCOUNTER — Ambulatory Visit: Payer: Self-pay | Attending: Critical Care Medicine | Admitting: Critical Care Medicine

## 2018-11-20 VITALS — BP 137/77 | HR 85 | Temp 98.9°F | Resp 18 | Ht 73.0 in | Wt 237.0 lb

## 2018-11-20 DIAGNOSIS — G40909 Epilepsy, unspecified, not intractable, without status epilepticus: Secondary | ICD-10-CM | POA: Insufficient documentation

## 2018-11-20 DIAGNOSIS — F17210 Nicotine dependence, cigarettes, uncomplicated: Secondary | ICD-10-CM | POA: Insufficient documentation

## 2018-11-20 DIAGNOSIS — R748 Abnormal levels of other serum enzymes: Secondary | ICD-10-CM | POA: Insufficient documentation

## 2018-11-20 DIAGNOSIS — Z114 Encounter for screening for human immunodeficiency virus [HIV]: Secondary | ICD-10-CM

## 2018-11-20 DIAGNOSIS — R972 Elevated prostate specific antigen [PSA]: Secondary | ICD-10-CM | POA: Insufficient documentation

## 2018-11-20 DIAGNOSIS — J449 Chronic obstructive pulmonary disease, unspecified: Secondary | ICD-10-CM | POA: Insufficient documentation

## 2018-11-20 DIAGNOSIS — F1910 Other psychoactive substance abuse, uncomplicated: Secondary | ICD-10-CM | POA: Insufficient documentation

## 2018-11-20 DIAGNOSIS — Z7289 Other problems related to lifestyle: Secondary | ICD-10-CM | POA: Insufficient documentation

## 2018-11-20 DIAGNOSIS — Z803 Family history of malignant neoplasm of breast: Secondary | ICD-10-CM | POA: Insufficient documentation

## 2018-11-20 DIAGNOSIS — Z800 Family history of malignant neoplasm of digestive organs: Secondary | ICD-10-CM | POA: Insufficient documentation

## 2018-11-20 DIAGNOSIS — K921 Melena: Secondary | ICD-10-CM | POA: Insufficient documentation

## 2018-11-20 DIAGNOSIS — Z79899 Other long term (current) drug therapy: Secondary | ICD-10-CM | POA: Insufficient documentation

## 2018-11-20 DIAGNOSIS — R740 Nonspecific elevation of levels of transaminase and lactic acid dehydrogenase [LDH]: Secondary | ICD-10-CM | POA: Insufficient documentation

## 2018-11-20 DIAGNOSIS — Z8249 Family history of ischemic heart disease and other diseases of the circulatory system: Secondary | ICD-10-CM | POA: Insufficient documentation

## 2018-11-20 DIAGNOSIS — R7401 Elevation of levels of liver transaminase levels: Secondary | ICD-10-CM

## 2018-11-20 DIAGNOSIS — Z720 Tobacco use: Secondary | ICD-10-CM | POA: Insufficient documentation

## 2018-11-20 DIAGNOSIS — I10 Essential (primary) hypertension: Secondary | ICD-10-CM | POA: Insufficient documentation

## 2018-11-20 DIAGNOSIS — Z8546 Personal history of malignant neoplasm of prostate: Secondary | ICD-10-CM | POA: Insufficient documentation

## 2018-11-20 DIAGNOSIS — R3915 Urgency of urination: Secondary | ICD-10-CM | POA: Insufficient documentation

## 2018-11-20 DIAGNOSIS — Z789 Other specified health status: Secondary | ICD-10-CM

## 2018-11-20 DIAGNOSIS — Z1211 Encounter for screening for malignant neoplasm of colon: Secondary | ICD-10-CM

## 2018-11-20 MED ORDER — CELECOXIB 100 MG PO CAPS: 100.0000 mg | ORAL_CAPSULE | Freq: Two times a day (BID) | ORAL | 3 refills | Status: DC

## 2018-11-20 MED ORDER — TRIAMTERENE-HCTZ 75-50 MG PO TABS: 1.0000 | ORAL_TABLET | Freq: Every day | ORAL | 6 refills | Status: DC

## 2018-11-20 MED ORDER — ALBUTEROL SULFATE HFA 108 (90 BASE) MCG/ACT IN AERS: 2.0000 | INHALATION_SPRAY | RESPIRATORY_TRACT | 2 refills | Status: AC | PRN

## 2018-11-20 MED ORDER — ATENOLOL 100 MG PO TABS: 100.0000 mg | ORAL_TABLET | Freq: Every day | ORAL | 6 refills | Status: AC

## 2018-11-20 MED ORDER — LEVETIRACETAM 500 MG PO TABS: 500.0000 mg | ORAL_TABLET | Freq: Two times a day (BID) | ORAL | 11 refills | Status: AC

## 2018-11-20 MED FILL — ATENOLOL 100 MG TABLET: 100 | 30 days supply | Qty: 30 | Fill #0

## 2018-11-20 MED FILL — levETIRAcetam 500 MG TABS: 500 | 30 days supply | Qty: 60 | Fill #0

## 2018-11-20 MED FILL — TRIAMTERENE-HCTZ 75-50 MG T: 75-50 | 30 days supply | Qty: 30 | Fill #0

## 2018-11-20 MED FILL — !VENTOLIN HFA INHALER: 108 (90 BAS | 16 days supply | Qty: 18 | Fill #0

## 2018-11-20 MED FILL — CELECOXIB 100 MG CAPSULE: 100 | 30 days supply | Qty: 60 | Fill #0

## 2018-11-20 NOTE — Progress Notes (Addendum)
Subjective:    Patient ID: Roy Rosales, male    DOB: 1966-02-15, 53 y.o.   MRN: [id number]  This is a 53 year old male who is seen post emergency room visit for seizure disorder.  Patient has past history of alcohol use hypertension COPD and prostate cancer.  The patient formerly was drinking greater than 6 beers a day and sometimes mixing it with alcohol.  There was occasional marijuana use.  The patient was seen in November in the emergency room and then followed up with neurology in November.  The patient states he has been compliant with his current seizure medicine program of Keppra twice daily.  He states his alcohol use has been diminished now.  The patient does not have insurance and is not able to complete any of the studies that neurology recommended.      The patient also states he has diffuse osteoarthritis in the back and hips and upper extremities.  He states he already has received his flu vaccine.  He denies any GI upset.  There have been no tarry stools or blood in the stools.  He is using Ensure supplements.   The patient has been compliant with Tenormin and Maxide and needs refills for these for his hypertension   Note relative to the low back pain and hip pain the patient also relates decreased force of stream of urination frequency urgency and discoloration in the patient's semen.  Patient has pain in the knees shoulders and back of his head as well as in the low back and both hips.  Note there was an elevated alkaline phosphatase of 400 and November 2019.  The patient states he did have an elevated PSA of 8 in 2013 but elected not to have this evaluated or biopsied with urology.      Past Medical History:  Diagnosis Date  . COPD (chronic obstructive pulmonary disease) ([place])   . Hypertension   . Polysubstance abuse ([place])   . Prostate cancer [facility])      Family History  Problem Relation Age of Onset  . Breast cancer Mother   . Hypertension Mother   .  Cancer Sister   . Prostate cancer Father   . Prostate cancer Brother   . Stomach cancer Brother   . Seizures Neg Hx      Social History   Socioeconomic History  . Marital status: Legally Separated    Spouse name: Not on file  . Number of children: 1  . Years of education: Not on file  . Highest education level: GED or equivalent  Occupational History  . Occupation: concrete work  Scientific laboratory technician  . Financial resource strain: Not on file  . Food insecurity:    Worry: Not on file    Inability: Not on file  . Transportation needs:    Medical: Not on file    Non-medical: Not on file  Tobacco Use  . Smoking status: Current Every Day Smoker    Packs/day: 1.00    Types: Cigarettes  . Smokeless tobacco: Never Used  Substance and Sexual Activity  . Alcohol use: Yes    Comment: 6-12+ beer/day; update 09/12/18 none since 09/07/18, plans to quit, was drinking 5-6 beers per day  . Drug use: Yes    Types: Marijuana, PCP    Comment: PCP for about 3 months, none since 2017 or 2018  . Sexual activity: Yes  Lifestyle  . Physical activity:    Days per week: Not on file  Minutes per session: Not on file  . Stress: Not on file  Relationships  . Social connections:    Talks on phone: Not on file    Gets together: Not on file    Attends religious service: Not on file    Active member of club or organization: Not on file    Attends meetings of clubs or organizations: Not on file    Relationship status: Not on file  . Intimate partner violence:    Fear of current or ex partner: Not on file    Emotionally abused: Not on file    Physically abused: Not on file    Forced sexual activity: Not on file  Other Topics Concern  . Not on file  Social History Narrative   Lives at home with his mother    Right handed    Caffeine: 1 cup in the mornings     No Known Allergies   Outpatient Medications Prior to Visit  Medication Sig Dispense Refill  . diazepam (DIASTAT ACUDIAL) 10 MG GEL Place  10 mg rectally once as needed for up to 1 dose for seizure. 1 Package 0  . albuterol (PROVENTIL HFA;VENTOLIN HFA) 108 (90 Base) MCG/ACT inhaler Inhale 2 puffs into the lungs every 4 (four) hours as needed.    Marland Kitchen atenolol (TENORMIN) 100 MG tablet Take 100 mg by mouth daily.    Marland Kitchen EPINEPHrine 0.3 mg/0.3 mL IJ SOAJ injection Inject 0.3 mLs (0.3 mg total) into the muscle once as needed (severe allergic reaction, difficulty swallowing or breathing). 1 Device 1  . levETIRAcetam (KEPPRA) 500 MG tablet Take 1 tablet (500 mg total) by mouth 2 (two) times daily. 60 tablet 11  . triamterene-hydrochlorothiazide (MAXZIDE) 75-50 MG tablet Take 1 tablet by mouth daily.    . valACYclovir (VALTREX) 500 MG tablet Take 500 mg by mouth daily.    Marland Kitchen ibuprofen (ADVIL,MOTRIN) 800 MG tablet Take 800 mg by mouth as needed.     No facility-administered medications prior to visit.       Review of Systems  Constitutional: Negative.   HENT: Negative.   Respiratory: Negative for shortness of breath and wheezing.   Gastrointestinal: Negative.   Genitourinary: Positive for decreased urine volume, difficulty urinating, dysuria and urgency.       Semen is brown in color  Musculoskeletal: Positive for arthralgias, back pain and neck pain.       Hip pain,   Skin: Negative.   Neurological: Positive for seizures. Negative for dizziness, tremors, syncope, weakness, light-headedness, numbness and headaches.  Hematological: Negative.   Psychiatric/Behavioral: Negative.        Objective:   Physical Exam  Vitals:   11/20/18 1049  BP: 137/77  Pulse: 85  Resp: 18  Temp: 98.9 F (37.2 C)  TempSrc: Oral  SpO2: 100%  Weight: 237 lb (107.5 kg)  Height: 6' 1"  (1.854 m)    Gen: Pleasant, well-nourished, in no distress,  normal affect  ENT: No lesions,  mouth clear,  oropharynx clear, no postnasal drip  Neck: No JVD, no TMG, no carotid bruits  Lungs: No use of accessory muscles, no dullness to percussion, clear without  rales or rhonchi  Cardiovascular: RRR, heart sounds normal, no murmur or gallops, no peripheral edema  Abdomen: soft and NT, no HSM,  BS normal  Musculoskeletal: No deformities, no cyanosis or clubbing Hips tender, lower back with tenderness  Neuro: alert, non focal  Skin: Warm, no lesions or rashes  No results found. BMP  Latest Ref Rng & Units 11/20/2018 09/07/2018 07/12/2018  Glucose 65 - 99 mg/dL 94 155(H) 106(H)  BUN 6 - 24 mg/dL 11 7 8   Creatinine 0.76 - 1.27 mg/dL 0.79 1.37(H) 1.40(H)  BUN/Creat Ratio 9 - 20 14 - -  Sodium 134 - 144 mmol/L 134 136 135  Potassium 3.5 - 5.2 mmol/L 3.6 3.3(L) 3.0(L)  Chloride 96 - 106 mmol/L 94(L) 101 100  CO2 20 - 29 mmol/L 23 14(L) 24  Calcium 8.7 - 10.2 mg/dL 9.6 9.9 9.4   Lab Results  Component Value Date   WBC 12.4 (H) 11/20/2018   HGB 10.8 (L) 11/20/2018   HCT 32.2 (L) 11/20/2018   MCV 89 11/20/2018   PLT 332 11/20/2018   Hepatic Function Panel     Component Value Date/Time   PROT 7.6 11/20/2018 1138   ALBUMIN 4.1 11/20/2018 1138   AST 77 (H) 11/20/2018 1138   ALT 17 11/20/2018 1138   ALKPHOS 2,341 (HH) 11/20/2018 1138   BILITOT 0.5 11/20/2018 1138   CBC Latest Ref Rng & Units 11/20/2018 09/07/2018 07/12/2018  WBC 3.4 - 10.8 x10E3/uL 12.4(H) 9.8 17.4(H)  Hemoglobin 13.0 - 17.7 g/dL 10.8(L) 16.7 16.1  Hematocrit 37.5 - 51.0 % 32.2(L) 54.2(H) 50.5  Platelets 150 - 450 x10E3/uL 332 317 290    Hepatic Function Latest Ref Rng & Units 11/20/2018 09/07/2018 07/12/2018  Total Protein 6.0 - 8.5 g/dL 7.6 8.1 7.6  Albumin 3.8 - 4.9 g/dL 4.1 4.2 4.0  AST 0 - 40 IU/L 77(H) 56(H) 41  ALT 0 - 44 IU/L 17 26 28   Alk Phosphatase 39 - 117 IU/L 2,341(HH) 417(H) 99  Total Bilirubin 0.0 - 1.2 mg/dL 0.5 1.1 0.8       Assessment & Plan:  I personally reviewed all images and lab data in the [facility] system as well as any outside material available during this office visit and agree with the  radiology impressions.   COPD with chronic bronchitis  ([place]) History of COPD however no evidence of active disease process at this time  Essential hypertension History of hypertension stable at this time  Refill Tenormin and Maxide  Obtain complete metabolic panel  Melena History of melena in the past  We will obtain a stool card to check for heme in stool for cancer screening  Transaminitis History of elevated liver function studies  Follow-up liver function studies today in the office  Seizure disorder [facility]) Seizure disorder stable at this time  We will refill Keppra  We will obtain financial assistance additional studies per neurology's request can be obtained and also to obtain follow-up neurology visits  Tobacco use The patient was counseled on alcohol and tobacco use  Urgency of urination Note patient gives history of elevated PSA in 2013 but did not pursue an evaluation He now has urgency frequency and as well diffuse bone pain and change in color of semen  I am concerned about prostate cancer metastases to bone with the significant elevation in alkaline phosphatase  Plan Recheck PSA Check urinalysis Review alkaline phosphatase assessment  Elevated PSA Prior history of an elevated PSA note this is not in our system but I am suspecting it was discerned at [facility] urology who uses a different electronic health record  We will recheck PSA given the massive elevation in alkaline phosphatase  Will likely need an abdominal and pelvic CT scan and referral back to urology  Financial assistance is going to be significantly key in this patient as any procedures  will need to be covered as the patient has no insurance  Elevated alkaline phosphatase level Alkaline phosphatase greater than 2000 without other changes in liver enzymes except for a mild elevation in AST bilirubin is normal  We will send a fractionated isoenzyme for alkaline phosphatase along with a GGT level  Will likely need an abdominal pelvic CT scan and  PSA will be obtained I am concerned about bony metastases with prostate cancer in this situation I am less concerned about Paget's disease or primary liver disease although the patient has had a history of alcohol use with mild elevations in AST and ALT in the past  The patient was contacted after his office visit the following day on 11/21/2018 and was informed of the lab results and the need to come back in for additional lab testing and the likelihood of additional imaging needed  I discussed imaging options with radiology with Dr. Clovis Riley who did recommend the abdominal pelvic CT scan based on this patient's symptom complex if the PSA comes back elevated   Roy Rosales was seen today for hospitalization follow-up.  Diagnoses and all orders for this visit:  Seizure disorder ([place]) -     levETIRAcetam (KEPPRA) 500 MG tablet; Take 1 tablet (500 mg total) by mouth 2 (two) times daily. -     Comprehensive metabolic panel -     CBC with Differential/Platelet; Future -     CBC with Differential/Platelet  Transaminitis -     Comprehensive metabolic panel  Polysubstance abuse (HCC) -     HIV antibody (with reflex)  Encounter for screening for HIV -     HIV antibody (with reflex) -     Immature Cells  Alcohol use  Tobacco use  Melena -     Cancel: Fecal occult blood, imunochemical  COPD with chronic bronchitis (HCC)  Essential hypertension  Elevated alkaline phosphatase level -     Gamma GT -     Alkaline Phosphatase, Isoenzymes -     POCT URINALYSIS DIP (CLIN[facility]K)  Elevated PSA -     PSA -     POCT URINALYSIS DIP (CLINITEK)  Urgency of urination -     POCT URINALYSIS DIP (CLINITEK)  Other orders -     triamterene-hydrochlorothiazide (MAXZIDE) 75-50 MG tablet; Take 1 tablet by mouth daily. -     atenolol (TENORMIN) 100 MG tablet; Take 1 tablet (100 mg total) by mouth daily. -     albuterol (PROVENTIL HFA;VENTOLIN HFA) 108 (90 Base) MCG/ACT inhaler; Inhale 2 puffs into the  lungs every 4 (four) hours as needed. -     celecoxib (CELEBREX) 100 MG capsule; Take 1 capsule (100 mg total) by mouth 2 (two) times daily.    And HIV study will be obtained for screening

## 2018-11-20 NOTE — Patient Instructions (Addendum)
Refills on your Keppra was sent to our pharmacy Refills on your Tenormin and Maxide were also sent to our pharmacy  You need to obtain labs today to include a metabolic panel, complete blood count, and HIV study  We also need to obtain a sample of your stool to send for cancer screening  A prescription for Celebrex was sent to the pharmacy for your arthritis   Please fill out the financial assistance so that you can obtain an orange card and receive further follow-up by neurology and imaging studies to evaluate your arthritis  Please work on reducing your alcohol consumption  Focus on reduction of tobacco products  A primary care visit will be obtained within the next 6 weeks in our office

## 2018-11-21 ENCOUNTER — Other Ambulatory Visit (INDEPENDENT_AMBULATORY_CARE_PROVIDER_SITE_OTHER): Payer: Self-pay | Admitting: Critical Care Medicine

## 2018-11-21 ENCOUNTER — Ambulatory Visit: Payer: Self-pay | Attending: Family Medicine

## 2018-11-21 DIAGNOSIS — R748 Abnormal levels of other serum enzymes: Secondary | ICD-10-CM | POA: Insufficient documentation

## 2018-11-21 DIAGNOSIS — R972 Elevated prostate specific antigen [PSA]: Secondary | ICD-10-CM | POA: Insufficient documentation

## 2018-11-21 DIAGNOSIS — Z1211 Encounter for screening for malignant neoplasm of colon: Secondary | ICD-10-CM

## 2018-11-21 DIAGNOSIS — R3915 Urgency of urination: Secondary | ICD-10-CM | POA: Insufficient documentation

## 2018-11-21 LAB — CBC WITH DIFFERENTIAL/PLATELET
Basophils Absolute: 0.1 10*3/uL (ref 0.0–0.2)
Basos: 1 %
EOS (ABSOLUTE): 0.4 10*3/uL (ref 0.0–0.4)
Eos: 3 %
HEMATOCRIT: 32.2 % — AB (ref 37.5–51.0)
HEMOGLOBIN: 10.8 g/dL — AB (ref 13.0–17.7)
LYMPHS ABS: 3.7 10*3/uL — AB (ref 0.7–3.1)
Lymphs: 30 %
MCH: 29.8 pg (ref 26.6–33.0)
MCHC: 33.5 g/dL (ref 31.5–35.7)
MCV: 89 fL (ref 79–97)
MONOCYTES: 3 %
Monocytes Absolute: 0.4 10*3/uL (ref 0.1–0.9)
NEUTROS ABS: 7.7 10*3/uL — AB (ref 1.4–7.0)
NRBC: 3 % — ABNORMAL HIGH (ref 0–0)
Neutrophils: 62 %
Platelets: 332 10*3/uL (ref 150–450)
RBC: 3.63 x10E6/uL — ABNORMAL LOW (ref 4.14–5.80)
RDW: 14.2 % (ref 11.6–15.4)
WBC: 12.4 10*3/uL — ABNORMAL HIGH (ref 3.4–10.8)

## 2018-11-21 LAB — COMPREHENSIVE METABOLIC PANEL
A/G RATIO: 1.2 (ref 1.2–2.2)
ALBUMIN: 4.1 g/dL (ref 3.8–4.9)
ALK PHOS: 2341 IU/L — AB (ref 39–117)
ALT: 17 IU/L (ref 0–44)
AST: 77 IU/L — AB (ref 0–40)
BILIRUBIN TOTAL: 0.5 mg/dL (ref 0.0–1.2)
BUN / CREAT RATIO: 14 (ref 9–20)
BUN: 11 mg/dL (ref 6–24)
CO2: 23 mmol/L (ref 20–29)
Calcium: 9.6 mg/dL (ref 8.7–10.2)
Chloride: 94 mmol/L — ABNORMAL LOW (ref 96–106)
Creatinine, Ser: 0.79 mg/dL (ref 0.76–1.27)
GFR calc Af Amer: 119 mL/min/{1.73_m2} (ref >59)
GFR calc non Af Amer: 103 mL/min/{1.73_m2} (ref >59)
GLOBULIN, TOTAL: 3.5 g/dL (ref 1.5–4.5)
Glucose: 94 mg/dL (ref 65–99)
POTASSIUM: 3.6 mmol/L (ref 3.5–5.2)
SODIUM: 134 mmol/L (ref 134–144)
Total Protein: 7.6 g/dL (ref 6.0–8.5)

## 2018-11-21 LAB — POCT URINALYSIS DIP (CLINITEK)
BILIRUBIN UA: NEGATIVE
Blood, UA: NEGATIVE
GLUCOSE UA: NEGATIVE mg/dL
Ketones, POC UA: NEGATIVE mg/dL
LEUKOCYTES UA: NEGATIVE
NITRITE UA: NEGATIVE
POC PROTEIN,UA: NEGATIVE
Spec Grav, UA: 1.01 (ref 1.010–1.025)
UROBILINOGEN UA: 0.2 U/dL
pH, UA: 7.5 (ref 5.0–8.0)

## 2018-11-21 LAB — IMMATURE CELLS: Metamyelocytes: 1 % — ABNORMAL HIGH (ref 0–0)

## 2018-11-21 LAB — HIV ANTIBODY (ROUTINE TESTING W REFLEX): HIV SCREEN 4TH GENERATION: NONREACTIVE

## 2018-11-21 NOTE — Assessment & Plan Note (Signed)
Seizure disorder stable at this time  We will refill Keppra  We will obtain financial assistance additional studies per neurology's request can be obtained and also to obtain follow-up neurology visits

## 2018-11-21 NOTE — Assessment & Plan Note (Signed)
History of COPD however no evidence of active disease process at this time

## 2018-11-21 NOTE — Addendum Note (Signed)
Addended by: Asencion Noble E on: 11/21/2018 10:33 AM   Modules accepted: Orders

## 2018-11-21 NOTE — Assessment & Plan Note (Signed)
History of melena in the past  We will obtain a stool card to check for heme in stool for cancer screening

## 2018-11-21 NOTE — Assessment & Plan Note (Signed)
History of elevated liver function studies  Follow-up liver function studies today in the office

## 2018-11-21 NOTE — Assessment & Plan Note (Signed)
History of hypertension stable at this time  Refill Tenormin and Maxide  Obtain complete metabolic panel

## 2018-11-21 NOTE — Assessment & Plan Note (Signed)
The patient was counseled on alcohol and tobacco use

## 2018-11-21 NOTE — Assessment & Plan Note (Signed)
Alkaline phosphatase greater than 2000 without other changes in liver enzymes except for a mild elevation in AST bilirubin is normal  We will send a fractionated isoenzyme for alkaline phosphatase along with a GGT level  Will likely need an abdominal pelvic CT scan and PSA will be obtained I am concerned about bony metastases with prostate cancer in this situation I am less concerned about Paget's disease or primary liver disease although the patient has had a history of alcohol use with mild elevations in AST and ALT in the past  The patient was contacted after his office visit the following day on 11/21/2018 and was informed of the lab results and the need to come back in for additional lab testing and the likelihood of additional imaging needed  I discussed imaging options with radiology with Dr. Clovis Riley who did recommend the abdominal pelvic CT scan based on this patient's symptom complex if the PSA comes back elevated

## 2018-11-21 NOTE — Addendum Note (Signed)
Addended by: Trecia Rogers on: 11/21/2018 04:17 PM   Modules accepted: Orders

## 2018-11-21 NOTE — Assessment & Plan Note (Signed)
Prior history of an elevated PSA note this is not in our system but I am suspecting it was discerned at [facility] urology who uses a different electronic health record  We will recheck PSA given the massive elevation in alkaline phosphatase  Will likely need an abdominal and pelvic CT scan and referral back to urology  Financial assistance is going to be significantly key in this patient as any procedures will need to be covered as the patient has no insurance

## 2018-11-21 NOTE — Assessment & Plan Note (Signed)
Note patient gives history of elevated PSA in 2013 but did not pursue an evaluation He now has urgency frequency and as well diffuse bone pain and change in color of semen  I am concerned about prostate cancer metastases to bone with the significant elevation in alkaline phosphatase  Plan Recheck PSA Check urinalysis Review alkaline phosphatase assessment

## 2018-11-22 ENCOUNTER — Telehealth: Payer: Self-pay | Admitting: Critical Care Medicine

## 2018-11-22 DIAGNOSIS — R748 Abnormal levels of other serum enzymes: Secondary | ICD-10-CM

## 2018-11-22 DIAGNOSIS — R972 Elevated prostate specific antigen [PSA]: Secondary | ICD-10-CM

## 2018-11-22 NOTE — Telephone Encounter (Signed)
Spoke to patient. Verified  DOB. Pt informed that he needed to have a CT Scan due to elevated results of his PSA level. Advised of appointment and prep. Pt was able to repeat the information relayed to him:  Appointment: Thursday January 30 at 4:15 NPO- 4 hours prior to appointment except prep.  Advised to eat a good breakfast.  Drink prep at 2:30 and 3:30 May pick up prep from radiology department   Also informed to come to [facility] to meet with the financial counselor on Monday. Informed of the walk-in schedule. He already has a packet of information.   Pt verbalized understanding to all information relayed. Teach back method used.    Would a referral to the [facility] expedite Urological evaluation?

## 2018-11-22 NOTE — Telephone Encounter (Signed)
I spoke to the patient  Alk Phos is bone origin PSA 335 I suspect metastatic stage IV prostate CA.  He needs urology referral ASAP  Note he has no insurance  Needs financial assistance ASAP  Also CT Abdomen ordered for next week  He is aware of all of this   Asencion Noble

## 2018-11-23 NOTE — Telephone Encounter (Signed)
It is possible referral to [facility] may help but we have not done a biopsy yet. Usually oncology will not see the patient until a pt has had a positive cancer diagnosis

## 2018-11-25 ENCOUNTER — Ambulatory Visit: Payer: Self-pay | Attending: Family Medicine

## 2018-11-25 LAB — PSA: PROSTATE SPECIFIC AG, SERUM: 335 ng/mL — AB (ref 0.0–4.0)

## 2018-11-25 LAB — ALKALINE PHOSPHATASE, ISOENZYMES
Alkaline Phosphatase: 2365 IU/L (ref 39–117)
BONE FRACTION: 0 % — ABNORMAL LOW (ref 12–68)
INTESTINAL FRAC.: 100 % — AB (ref 0–18)
LIVER FRACTION: 0 % — ABNORMAL LOW (ref 13–88)

## 2018-11-25 LAB — GAMMA GT: GGT: 64 IU/L (ref 0–65)

## 2018-11-27 ENCOUNTER — Ambulatory Visit: Payer: Self-pay | Admitting: Urology

## 2018-11-27 ENCOUNTER — Telehealth: Payer: Self-pay | Admitting: *Deleted

## 2018-11-27 ENCOUNTER — Encounter: Payer: Self-pay | Admitting: Urology

## 2018-11-27 ENCOUNTER — Telehealth: Payer: Self-pay | Admitting: Urology

## 2018-11-27 NOTE — Telephone Encounter (Signed)
Thank you Christian Mate, we need to keep trying to reach the patient to get him rescheduled.

## 2018-11-27 NOTE — Telephone Encounter (Signed)
Unfortunately, this patient was a no-show to clinic today.  In reviewing his chart, he has a markedly elevated PSA and alk phos most consistent with metastatic prostate cancer.  He needs to be seen and evaluated by urology ASAP with prostate biopsy for tissue diagnosis.  I will send a message his referring provider, Dr. Lyda Jester.  We will continue to reach out to the patient including sending him a letter.  Hollice Espy, MD

## 2018-11-27 NOTE — Progress Notes (Deleted)
11/27/2018 12:56 PM   Roy Rosales 02-11-1966 [id number]  Referring provider: Elsie Stain, MD [address], [place] [address]  No chief complaint on file.   HPI: 53 year old male with markedly elevated PSA who presents today to establish care for probable metastatic prostate cancer.  He underwent routine annual PSA screening by his primary care physician on 11/21/2018.  Unfortunately, his PSA was noted to be markedly elevated to 335 on 11/21/2018.  Addition, an alk phos was also markedly elevated to 2365.  CT abdomen pelvis with contrast is ordered for tomorrow and pending.  He has a personal history of polysubstance abuse and was recently admitted with seizure-like activity.   68M referred by Dr. Seward Carol, MD for evaluation and management of an elevated PSA. Patient was found to have an elevated PSA which was drawn as part of a prostate cancer screening. He has a positive family history of prostate cancer - father (treated with brachy 33yr ago) and uncle . The patient denies any bone pain, new back pain, or lower extremity edema. The patient denies any changes in his voiding symptoms over the last 6 months. Specifically he denies dysuria or hematuria. PSA History: 8.92 on 01/01/14 7.96 on 01/13/14, 18% free PSA IPSS:8,1  SHIM:22  Prostate cancer profile Stage: T1c PSA: 8.92 Biopsy ,9 /14 cores positive: Gleason 3+3 = 6, 95% of cores taken of the transitional zone the hypoechoic area in the left anterior region. Prostate volume: 40 g with median lobe Prostate cancer nomogram after radical prostatectomy: OC- 49% ECE- 44% SVI-5% LNI -4% PFS (surgery)- 87% 5 years/78% 10 years Cancer specific survival- 99% at 10 years/99% at 15 years Interval: Patient's voiding symptoms and bleeding have resolved following his biopsy. Patient was treated for genital herpes which has resolved. He is otherwise feeling well. S    PMH: Past Medical History:  Diagnosis  Date  . COPD (chronic obstructive pulmonary disease) ([place]   . Hypertension   . [place]ubstance [place] ([place]   . Prostate cancer ([facility]     Surgical History: Past Surgical History:  Procedure Laterality Date  . NO PAST SURGERIES      Home Medications:  Allergies as of 11/27/2018   No Known Allergies     Medication List       Accurate as of November 27, 2018 12:56 PM. Always use your most recent med list.        albuterol 108 (90 Base) MCG/ACT inhaler Commonly known as:  PROVENTIL HFA;VENTOLIN HFA Inhale 2 puffs into the lungs every 4 (four) hours as needed.   atenolol 100 MG tablet Commonly known as:  TENORMIN Take 1 tablet (100 mg total) by mouth daily.   celecoxib 100 MG capsule Commonly known as:  CELEBREX Take 1 capsule (100 mg total) by mouth 2 (two) times daily.   diazepam 10 MG Gel Commonly known as:  DIASTAT ACUDIAL Place 10 mg rectally once as needed for up to 1 dose for seizure.   levETIRAcetam 500 MG tablet Commonly known as:  KEPPRA Take 1 tablet (500 mg total) by mouth 2 (two) times daily.   triamterene-hydrochlorothiazide 75-50 MG tablet Commonly known as:  MAXZIDE Take 1 tablet by mouth daily.   valACYclovir 500 MG tablet Commonly known as:  VALTREX Take 500 mg by mouth daily.       Allergies: No Known Allergies  Family History: Family History  Problem Relation Age of Onset  . Breast cancer Mother   . Hypertension  Mother   . Cancer Sister   . Prostate cancer Father   . Prostate cancer Brother   . Stomach cancer Brother   . Seizures Neg Hx     Social History:  reports that he has been smoking cigarettes. He has been smoking about 1.00 pack per day. He has never used smokeless tobacco. He reports current alcohol use. He reports current drug use. Drugs: Marijuana and PCP.  ROS:                                        Physical Exam: There were no vitals taken for this visit.  Constitutional:  Alert and oriented,  No acute distress. HEENT: [name], moist mucus membranes.  Trachea midline, no masses. Cardiovascular: No clubbing, cyanosis, or edema. Respiratory: Normal respiratory effort, no increased work of breathing. GI: Abdomen is soft, nontender, nondistended, no abdominal masses GU: No CVA tenderness Lymph: No cervical or inguinal lymphadenopathy. Skin: No rashes, bruises or suspicious lesions. Neurologic: Grossly intact, no focal deficits, moving all 4 extremities. Psychiatric: Normal mood and affect.  Laboratory Data: Lab Results  Component Value Date   WBC 12.4 (H) 11/20/2018   HGB 10.8 (L) 11/20/2018   HCT 32.2 (L) 11/20/2018   MCV 89 11/20/2018   PLT 332 11/20/2018    Lab Results  Component Value Date   CREATININE 0.79 11/20/2018    No results found for: PSA  No results found for: TESTOSTERONE  No results found for: HGBA1C  Urinalysis    Component Value Date/Time   COLORURINE COLORLESS (A) 03/08/2017 1823   APPEARANCEUR CLEAR 03/08/2017 1823   LABSPEC 1.001 (L) 03/08/2017 1823   PHURINE 7.0 03/08/2017 1823   GLUCOSEU NEGATIVE 03/08/2017 1823   HGBUR NEGATIVE 03/08/2017 1823   BILIRUBINUR negative 11/21/2018 1118   KETONESUR negative 11/21/2018 1118   [place] 03/08/2017 1823   PROTEINUR NEGATIVE 03/08/2017 1823   UROBILINOGEN 0.2 11/21/2018 1118   NITRITE Negative 11/21/2018 1118   NITRITE NEGATIVE 03/08/2017 1823   LEUKOCYTESUR Negative 11/21/2018 1118    No results found for: LABMICR, WBCUA, RBCUA, LABEPIT, MUCUS, BACTERIA  Pertinent Imaging: *** No results found for this or any previous visit. No results found for this or any previous visit. No results found for this or any previous visit. No results found for this or any previous visit. No results found for this or any previous visit. No results found for this or any previous visit. No results found for this or any previous visit. No results found for this or any previous visit.  Assessment &  Plan:    There are no diagnoses linked to this encounter.  No follow-ups on file.  Hollice Espy, MD  [facility] [address], [place], [place] [address] [phone]

## 2018-11-27 NOTE — Telephone Encounter (Signed)
Attempt to contact patient on the 3 contact numbers listed in chart. LMOVM to return call on [phone]. Unable to reach on the other two contacts.

## 2018-11-28 ENCOUNTER — Ambulatory Visit (HOSPITAL_COMMUNITY)
Admission: RE | Admit: 2018-11-28 | Discharge: 2018-11-28 | Disposition: A | Discharge status: Home / Self Care | Payer: Self-pay | Source: Ambulatory Visit | Attending: Critical Care Medicine | Admitting: Critical Care Medicine

## 2018-11-28 DIAGNOSIS — R972 Elevated prostate specific antigen [PSA]: Secondary | ICD-10-CM | POA: Insufficient documentation

## 2018-11-28 DIAGNOSIS — R748 Abnormal levels of other serum enzymes: Secondary | ICD-10-CM | POA: Insufficient documentation

## 2018-11-28 MED ORDER — IOHEXOL 300 MG/ML  SOLN
100.0000 mL | Freq: Once | INTRAMUSCULAR | Status: AC | PRN
  Administered 2018-11-28: 100 mL via INTRAVENOUS

## 2018-11-28 NOTE — Telephone Encounter (Signed)
Mr. Roy Rosales was informed of his appointment: December 04, 2018 at 1 pm. He was given the address to [place], [place]. [place] [phone].  Pt wrote information down and repeated the information that was given prior to the end of the call.   Advised to call [facility] or Urology office for any questions.

## 2018-11-29 ENCOUNTER — Ambulatory Visit: Payer: Self-pay | Admitting: Neurology

## 2018-12-02 ENCOUNTER — Other Ambulatory Visit: Payer: Self-pay | Admitting: Critical Care Medicine

## 2018-12-02 DIAGNOSIS — C61 Malignant neoplasm of prostate: Secondary | ICD-10-CM

## 2018-12-02 DIAGNOSIS — C7951 Secondary malignant neoplasm of bone: Principal | ICD-10-CM

## 2018-12-02 MED ORDER — OXYCODONE-ACETAMINOPHEN 10-325 MG PO TABS: 1.0000 | ORAL_TABLET | ORAL | 0 refills | Status: DC | PRN

## 2018-12-02 NOTE — Telephone Encounter (Signed)
Noted  

## 2018-12-02 NOTE — Progress Notes (Signed)
Pt aware of ABD CT results  Has bony mets and likely prostate CA with imaging and liver and intraabd LNs  Percocet sent to pharmacy for pain  Has appt with Urology Wed  Note Dr Erlene Quan told me he had a prostate bx in 2013 positive for CAncer   Will need new bx for tumor markers.  He no showed for prostatectomy

## 2018-12-02 NOTE — Progress Notes (Addendum)
° °  12/04/2018   CC:  Chief Complaint  Patient presents with   Prostate Biopsy    New Patient    HPI: Roy Rosales is a 53 y.o. male with prostate cancer and an elevated PSA of 335.0 (as of 11/21/2018) who presents today for a prostate biopsy.  Blood pressure 133/88, pulse 98, height 6\' 1"  (1.854 m), weight 230 lb (104.3 kg). NED. A&Ox3.   No respiratory distress   Abd soft, NT, ND Normal sphincter tone  Prostate Biopsy Procedure   Informed consent was obtained after discussing risks/benefits of the procedure.  A time out was performed to ensure correct patient identity.  Pre-Procedure: - Last PSA Level: 335.0 ng/mL - Gentamicin given prophylactically - Levaquin 500 mg administered PO -Transrectal Ultrasound performed revealing a 68.79 cc prostate -No significant hypoechoic or median lobe noted - Dialated seminal vesicles - Diffuse heterogeneity  Procedure: - Prostate block performed using 10 cc 1% lidocaine and biopsies taken from sextant areas, a total of 6 under ultrasound guidance.  Post-Procedure: - Patient tolerated the procedure well - He was counseled to seek immediate medical attention if experiences any severe pain, significant bleeding, or fevers   Assessment/ Plan:  1. Metastatic Prostate Cancer - Referral to cancer center in [place]  Return for Referral to [facility] in [place].  Adele Schilder   I have reviewed the above documentation for accuracy and completeness, and I agree with the above.   Hollice Espy, MD

## 2018-12-04 ENCOUNTER — Encounter: Payer: Self-pay | Admitting: Urology

## 2018-12-04 ENCOUNTER — Other Ambulatory Visit: Payer: Self-pay | Admitting: Urology

## 2018-12-04 ENCOUNTER — Ambulatory Visit (INDEPENDENT_AMBULATORY_CARE_PROVIDER_SITE_OTHER): Payer: Self-pay | Admitting: Urology

## 2018-12-04 VITALS — BP 133/88 | HR 98 | Ht 73.0 in | Wt 230.0 lb

## 2018-12-04 DIAGNOSIS — C61 Malignant neoplasm of prostate: Secondary | ICD-10-CM

## 2018-12-04 DIAGNOSIS — C7951 Secondary malignant neoplasm of bone: Secondary | ICD-10-CM

## 2018-12-04 MED ORDER — DEGARELIX ACETATE 120 MG ~~LOC~~ SOLR
240.0000 mg | Freq: Once | SUBCUTANEOUS | Status: AC
  Administered 2018-12-04: 240 mg via SUBCUTANEOUS

## 2018-12-04 MED ORDER — LEVOFLOXACIN 500 MG PO TABS
500.0000 mg | ORAL_TABLET | Freq: Once | ORAL | Status: AC
  Administered 2018-12-04: 500 mg via ORAL

## 2018-12-04 MED ORDER — GENTAMICIN SULFATE 40 MG/ML IJ SOLN
80.0000 mg | Freq: Once | INTRAMUSCULAR | Status: AC
  Administered 2018-12-04: 80 mg via INTRAMUSCULAR

## 2018-12-04 NOTE — Progress Notes (Signed)
12/04/2018  2:05 PM   Roy Rosales 11-19-1965 [id number]  Referring provider: Elsie Stain, MD [address], [place] [address]  Chief Complaint  Patient presents with   Prostate Biopsy    New Patient    HPI: Roy Rosales is a 53 y.o. male with prostate cancer and an elevated PSA of 335.0 (as of 11/21/2018), and evidence of metastatic disease on CT scan who presents today for a prostate biopsy initiation of ADT..  I been contacted by his primary care physician, Dr. Lyda Jester to help expedite his care.  Notably, the patient has extensive history of psychiatric and substance abuse as well as socioeconomic factors contributing to his history of noncompliance and lack of follow-up.  The patient has a known personal history of prostate cancer, Gleason 3+3 in 2013.  He was scheduled to undergo prostatectomy with Dr. Louis Meckel, but cancelled and did not follow up.  On 11/29/2018, he had a CT scan with evidence of widespread bone metastases. These are predominantly sclerotic with a few scattered lytic lesions noted. Pathologic compression fractures are noted including T8, T9 and T12., enlarged porta hepatic, left external iliac and left posterior pelvic lymph nodes compatible with metastatic adenopathy, prostate gland enlargement, small right adrenal nodule, and an indeterminate low-density structure along the dome of liver measures 1.6 cm.   Today, the patient admits decreased appetite, and nonspecific diffuse bone pain, not focal or localized.  Is any neurological focal deficits.   Denies any urinary issues.  No hematuria, urgency or frequency.  No obstructive voiding symptoms.  PMH: Past Medical History:  Diagnosis Date   COPD (chronic obstructive pulmonary disease) ([place])    Hypertension    Polysubstance abuse ([place])    Prostate cancer [facility])     Surgical History: Past Surgical History:  Procedure Laterality Date   NO PAST SURGERIES      Home  Medications:  Allergies as of 12/04/2018   No Known Allergies     Medication List       Accurate as of December 04, 2018  2:05 PM. Always use your most recent med list.        albuterol 108 (90 Base) MCG/ACT inhaler Commonly known as:  PROVENTIL HFA;VENTOLIN HFA Inhale 2 puffs into the lungs every 4 (four) hours as needed.   atenolol 100 MG tablet Commonly known as:  TENORMIN Take 1 tablet (100 mg total) by mouth daily.   celecoxib 100 MG capsule Commonly known as:  CELEBREX Take 1 capsule (100 mg total) by mouth 2 (two) times daily.   diazepam 10 MG Gel Commonly known as:  DIASTAT ACUDIAL Place 10 mg rectally once as needed for up to 1 dose for seizure.   levETIRAcetam 500 MG tablet Commonly known as:  KEPPRA Take 1 tablet (500 mg total) by mouth 2 (two) times daily.   oxyCODONE-acetaminophen 10-325 MG tablet Commonly known as:  PERCOCET Take 1 tablet by mouth every 4 (four) hours as needed for pain.   triamterene-hydrochlorothiazide 75-50 MG tablet Commonly known as:  MAXZIDE Take 1 tablet by mouth daily.   valACYclovir 500 MG tablet Commonly known as:  VALTREX Take 500 mg by mouth daily.       Allergies: No Known Allergies  Family History: Family History  Problem Relation Age of Onset   Breast cancer Mother    Hypertension Mother    Cancer Sister    Prostate cancer Father    Prostate cancer Brother  Stomach cancer Brother    Seizures Neg Hx     Social History:  reports that he has been smoking cigarettes. He has been smoking about 1.00 pack per day. He has never used smokeless tobacco. He reports current alcohol use. He reports current drug use. Drugs: Marijuana and PCP.  ROS: UROLOGY Frequent Urination?: No Hard to postpone urination?: No Burning/pain with urination?: No Get up at night to urinate?: No Leakage of urine?: No Urine stream starts and stops?: No Trouble starting stream?: No Do you have to strain to urinate?: No Blood in  urine?: No Urinary tract infection?: No Sexually transmitted disease?: No Injury to kidneys or bladder?: No Painful intercourse?: No Weak stream?: No Erection problems?: No Penile pain?: No  Gastrointestinal Nausea?: No Vomiting?: No Indigestion/heartburn?: No Diarrhea?: No Constipation?: Yes  Constitutional Fever: No Night sweats?: Yes Weight loss?: No Fatigue?: Yes  Skin Skin rash/lesions?: No Itching?: No  Eyes Blurred vision?: No Double vision?: No  Ears/Nose/Throat Sore throat?: No Sinus problems?: No  Hematologic/Lymphatic Swollen glands?: No Easy bruising?: No  Cardiovascular Leg swelling?: No Chest pain?: No  Respiratory Cough?: No Shortness of breath?: Yes  Endocrine Excessive thirst?: Yes  Musculoskeletal Back pain?: Yes Joint pain?: Yes  Neurological Headaches?: No Dizziness?: No  Psychologic Depression?: No Anxiety?: No  Physical Exam: BP 133/88    Pulse 98    Ht 6\' 1"  (1.854 m)    Wt 230 lb (104.3 kg)    BMI 30.34 kg/m   Constitutional:  Well nourished. Alert and oriented, No acute distress. HEENT: [place] AT, moist mucus membranes.  Trachea midline, no masses. Cardiovascular: No clubbing, cyanosis, or edema. Respiratory: Normal respiratory effort, no increased work of breathing. GI: Abdomen is soft, non tender, non distended, no abdominal masses. Liver and spleen not palpable.  No hernias appreciated.  Stool sample for occult testing is not indicated. Rectal: Normal sphincter tone, grossly abnormal with diffuse bilateral nodularity Skin: No rashes, bruises or suspicious lesions. Neurologic: Grossly intact, no focal deficits, moving all 4 extremities. Psychiatric: Normal mood and affect.  Laboratory Data: Lab Results  Component Value Date   WBC 12.4 (H) 11/20/2018   HGB 10.8 (L) 11/20/2018   HCT 32.2 (L) 11/20/2018   MCV 89 11/20/2018   PLT 332 11/20/2018    Lab Results  Component Value Date   CREATININE 0.79 11/20/2018    Results for [name], [name] (MRN [id number]) as of 12/04/2018 13:34  Ref. Range 11/21/2018 11:01  Prostate Specific Ag, Serum Latest Ref Range: 0.0 - 4.0 ng/mL 335.0 (H)    Results for [name], [name] (MRN [id number]) as of 12/04/2018 13:34  Ref. Range 03/08/2017 18:20 07/12/2018 08:41 09/07/2018 05:14 11/20/2018 11:38 11/21/2018 11:01  Alkaline Phosphatase Latest Ref Range: 39 - 117 IU/L 47 99 417 (H) 2,341 (HH) 2,365 (HH)   Pertinent Imaging: CLINICAL DATA:  Abdominal pain and bone pain. Evaluate for prostate cancer metastasis. Adenopathy.  EXAM: CT ABDOMEN AND PELVIS WITH CONTRAST  TECHNIQUE: Multidetector CT imaging of the abdomen and pelvis was performed using the standard protocol following bolus administration of intravenous contrast.  CONTRAST:  12mL OMNIPAQUE IOHEXOL 300 MG/ML  SOLN  COMPARISON:  None.  FINDINGS: Lower chest: No acute abnormality.  Hepatobiliary: Indeterminate low-density structure along the dome of liver measures 1.6 x 1.0 cm, image 9/3. No additional focal liver abnormalities. The gallbladder appears normal. No biliary dilatation.  Pancreas: Unremarkable. No pancreatic ductal dilatation or surrounding inflammatory changes.  Spleen: Normal in size without focal abnormality.  Adrenals/Urinary Tract: Right adrenal gland nodule measures 1.6 cm and 69 HU. Indeterminate. Slightly exophytic intermediate attenuating structure within the upper pole of right kidney measures 2.3 cm and 26 HU. Bilateral renal cortical lobulation identified. No hydronephrosis.  Stomach/Bowel: The stomach appears normal. The small bowel loops have a normal course and caliber. The appendix is visualized and appears normal. Normal appearance of the colon.  Vascular/Lymphatic: Mild aortic atherosclerosis. No aneurysm. Porta hepatic lymph node is enlarged measuring 1.7 cm, image 24/3. Left external iliac node is enlarged measuring 1.7 cm, image 73/3. Within the  left posterior pelvis there is a enlarged lymph node posterior to the prostate gland which measures 1.4 cm, image 81/3.  Reproductive: The prostate gland Measures 5.2 by 5.4 by 5.5 cm (volume = 81 cm^3), image 83/3.  Other: No abdominal wall hernia or abnormality. No abdominopelvic ascites.  Musculoskeletal: Diffuse, predominantly sclerotic bone metastases are identified throughout the axial and appendicular skeleton. A few lytic lesions are also noted including in the right superior pubic rami. Here, lesion measures 1.8 cm, image 85/3. Pathologic fractures are identified within the lower thoracic spine, most notably at T8, T9 and T12. Underlying lytic lesion is identified within the T8 vertebra which measures approximately 1.3 cm.  IMPRESSION: 1. Widespread bone metastases. These are predominantly sclerotic with a few scattered lytic lesions noted. Pathologic compression fractures are noted including T8, T9 and T12. 2. Enlarged porta hepatic, left external iliac and left posterior pelvic lymph nodes compatible with metastatic adenopathy. 3. Prostate gland enlargement. 4. Small right adrenal nodule, indeterminate. This could be more definitively characterized with adrenal protocol MRI. 5. Indeterminate low-density structure along the dome of liver measures 1.6 cm. This could also be more definitively characterized with contrast enhanced MRI of the abdomen.   Electronically Signed   By: Kerby Moors M.D.   On: 11/29/2018 09:31  Assessment & Plan:    1. Metastatic Prostate Cancer CT scan, labs, and chart reviewed in detail prior to patient's arrival to the clinic as well as case discussed with his primary care physician.  Given his barriers to timely care, I did call the patient in advance, discussed the situation and recommend additional tissue for biopsy which he is agreed to having a prostate biopsy today.  We did review the risk of prostate biopsy including risk of  bleeding, infection including risk of sepsis of urinary source, hematuria and discomfort in detail.  He has had a fleets enema prior to the procedure today.  Per procedure notes for details.  Additionally today, we discussed initiation of ADT at length.  He will likely need additional adjuvant treatment including strong consideration of chemotherapy.  He is a good candidate for receiving the Degerelix in order to avoid flare phenomenon.  The likely side effects of initiation of ADT including hot flashes, increased central obesity, loss of muscle mass, long-term cardiac side effects, and bone health.  He understands the need for calcium and vitamin D supplementation.  I will defer all further care to medical oncology given his advanced metastatic nature.  We need a liver MRI to rule out visceral involvement.  Patient understands everything is agreeable this plan of care.  - Referral to [facility] in [place]  Return for Referal to [facility] in [place].  [facility] [address] [place], [place] [address] [phone]  I, Adele Schilder, am acting as a scribe for Hollice Espy, MD.    I have reviewed the above documentation for  accuracy and completeness, and I agree with the above.   Hollice Espy, MD  I spent 45 min with this patient of which greater than 50% was spent in counseling and coordination of care with the patient.

## 2018-12-04 NOTE — Progress Notes (Signed)
Firmagon Sub Q Injection  Due to Prostate Cancer patient is present today for a Firmagon Injection.   Medication: Mills Koller (Degarelix)  Dose: 240mg  Location: right & left upper abdomen Lot: [id number] Exp: 11/2020  Patient tolerated well, no complications were noted  Performed by: Fonnie Jarvis, CMA and Darrick Grinder, CMA  Follow up: 28days

## 2018-12-09 NOTE — Patient Instructions (Signed)
Discussed importance of bone health on ADT, recommend 1000-1200 mg daily calcium suppliment and 800-1000 IU vit D daily.  Also encouraged weight being exercises and cardiovascular health.  

## 2018-12-10 ENCOUNTER — Other Ambulatory Visit: Payer: Self-pay | Admitting: Urology

## 2018-12-10 LAB — PATHOLOGY REPORT

## 2018-12-11 ENCOUNTER — Telehealth: Payer: Self-pay | Admitting: Oncology

## 2018-12-11 ENCOUNTER — Encounter: Payer: Self-pay | Admitting: Oncology

## 2018-12-11 NOTE — Progress Notes (Signed)
Dr Joya Gaskins I want to  let you know the pt has been scheduled to see Dr. Alen Blew on 2/21 at 11am.

## 2018-12-11 NOTE — Telephone Encounter (Signed)
Received a referral from [facility]rology for metastatic prostate cancer. Pt has been cld and scheduled to see Dr. Alen Blew on 2/21 at 11am. Pt aware to arrive 30 minutes early. Letter mailed.

## 2018-12-13 ENCOUNTER — Other Ambulatory Visit: Payer: Self-pay | Admitting: Critical Care Medicine

## 2018-12-13 MED ORDER — OXYCODONE-ACETAMINOPHEN 10-325 MG PO TABS: 1.0000 | ORAL_TABLET | ORAL | 0 refills | Status: DC | PRN

## 2018-12-18 LAB — FECAL OCCULT BLOOD, IMMUNOCHEMICAL: Fecal Occult Bld: POSITIVE — AB

## 2018-12-18 MED FILL — TRIAMTERENE-HCTZ 75-50 MG T: 75-50 | 30 days supply | Qty: 30 | Fill #1

## 2018-12-18 MED FILL — CELECOXIB 100 MG CAP: 100 | 30 days supply | Qty: 60 | Fill #1

## 2018-12-18 MED FILL — ATENOLOL 100 MG TABLET: 100 | 30 days supply | Qty: 30 | Fill #1

## 2018-12-18 MED FILL — levETIRAcetam 500 MG TABS: 500 | 30 days supply | Qty: 60 | Fill #1

## 2018-12-20 ENCOUNTER — Telehealth: Payer: Self-pay | Admitting: Oncology

## 2018-12-20 ENCOUNTER — Inpatient Hospital Stay: Payer: No Typology Code available for payment source | Attending: Oncology | Admitting: Oncology

## 2018-12-20 ENCOUNTER — Encounter: Payer: Self-pay | Admitting: Medical Oncology

## 2018-12-20 VITALS — BP 120/75 | HR 85 | Temp 98.2°F | Resp 18 | Ht 73.0 in | Wt 227.1 lb

## 2018-12-20 DIAGNOSIS — C7951 Secondary malignant neoplasm of bone: Secondary | ICD-10-CM

## 2018-12-20 DIAGNOSIS — M4856XA Collapsed vertebra, not elsewhere classified, lumbar region, initial encounter for fracture: Secondary | ICD-10-CM

## 2018-12-20 DIAGNOSIS — R569 Unspecified convulsions: Secondary | ICD-10-CM

## 2018-12-20 DIAGNOSIS — C61 Malignant neoplasm of prostate: Secondary | ICD-10-CM

## 2018-12-20 DIAGNOSIS — M545 Low back pain: Secondary | ICD-10-CM

## 2018-12-20 DIAGNOSIS — Z79899 Other long term (current) drug therapy: Secondary | ICD-10-CM

## 2018-12-20 DIAGNOSIS — C775 Secondary and unspecified malignant neoplasm of intrapelvic lymph nodes: Secondary | ICD-10-CM

## 2018-12-20 DIAGNOSIS — Z7189 Other specified counseling: Secondary | ICD-10-CM | POA: Insufficient documentation

## 2018-12-20 MED ORDER — PROCHLORPERAZINE MALEATE 10 MG PO TABS: 10.0000 mg | ORAL_TABLET | Freq: Four times a day (QID) | ORAL | 0 refills | Status: DC | PRN

## 2018-12-20 MED ORDER — OXYCODONE-ACETAMINOPHEN 10-325 MG PO TABS: 1.0000 | ORAL_TABLET | ORAL | 0 refills | Status: DC | PRN

## 2018-12-20 NOTE — Progress Notes (Signed)
Reason for the request:   Prostate cancer  HPI: I was asked by Dr. Erlene Quan to evaluate Roy Rosales for diagnosis of prostate cancer.  He is a 53 year old man found to have an elevated PSA of 335 elevated alkaline phosphatase suspicious for advanced prostate cancer.  He was told that he had prostate cancer dating back in 2014 but no definitive therapy were done for multitude of reasons.  He also was found to have seizures in September and November 2019 with imaging studies of the brain did not show any tumors or abnormalities.  He underwent a CT scan of the abdomen and pelvis on November 28, 2018 after presenting with abdominal pain and showed widespread metastatic disease to the bone with pathologic compression fractures at T8, T9 and T12.  Enlarged porta hepatis and left external iliac and left posterior pelvic lymph nodes were also noted.  Based on these findings, he underwent valuation by Dr. Erlene Quan and a prostate biopsy completed on December 04, 2018.  The biopsy showed prostate adenocarcinoma Gleason score of 5+4 = 9.  Based on these findings, he received Firmagon injection at 240 mg on December 04, 2018 under the care of Dr. Erlene Quan.  He was referred to me for further evaluation regarding these findings.  Clinically, he reports both back pain and shoulder pain that requires Percocet at this time.  He reports using Percocet that 1 to 2 tablets every 2-3 hours without any significant improvement since the starting of hormone therapy.  He also reports shoulder pain that has been more chronic in nature and related to old injury.  He continues to smoke but denies any recent respiratory complaints.  Continues to drive and attends to activities of daily living.  He does not report any headaches, blurry vision, syncope or seizures. Does not report any fevers, chills or sweats.  Does not report any cough, wheezing or hemoptysis.  Does not report any chest pain, palpitation, orthopnea or leg edema.  Does not report  any nausea, vomiting or abdominal pain.  Does not report any constipation or diarrhea.  Does not report any skeletal complaints.    Does not report frequency, urgency or hematuria.  Does not report any skin rashes or lesions. Does not report any heat or cold intolerance.  Does not report any lymphadenopathy or petechiae.  Does not report any anxiety or depression.  Remaining review of systems is negative.    Past Medical History:  Diagnosis Date  . COPD (chronic obstructive pulmonary disease) ([place])   . Hypertension   . Polysubstance abuse ([place])   . Prostate cancer [facility])   :  Past Surgical History:  Procedure Laterality Date  . NO PAST SURGERIES    :   Current Outpatient Medications:  .  albuterol (PROVENTIL HFA;VENTOLIN HFA) 108 (90 Base) MCG/ACT inhaler, Inhale 2 puffs into the lungs every 4 (four) hours as needed., Disp: 1 Inhaler, Rfl: 2 .  atenolol (TENORMIN) 100 MG tablet, Take 1 tablet (100 mg total) by mouth daily., Disp: 30 tablet, Rfl: 6 .  celecoxib (CELEBREX) 100 MG capsule, Take 1 capsule (100 mg total) by mouth 2 (two) times daily., Disp: 60 capsule, Rfl: 3 .  diazepam (DIASTAT ACUDIAL) 10 MG GEL, Place 10 mg rectally once as needed for up to 1 dose for seizure., Disp: 1 Package, Rfl: 0 .  levETIRAcetam (KEPPRA) 500 MG tablet, Take 1 tablet (500 mg total) by mouth 2 (two) times daily., Disp: 60 tablet, Rfl: 11 .  oxyCODONE-acetaminophen (PERCOCET) 10-325  MG tablet, Take 1 tablet by mouth every 4 (four) hours as needed for pain., Disp: 60 tablet, Rfl: 0 .  triamterene-hydrochlorothiazide (MAXZIDE) 75-50 MG tablet, Take 1 tablet by mouth daily., Disp: 30 tablet, Rfl: 6 .  valACYclovir (VALTREX) 500 MG tablet, Take 500 mg by mouth daily., Disp: , Rfl: :  No Known Allergies:  Family History  Problem Relation Age of Onset  . Breast cancer Mother   . Hypertension Mother   . Cancer Sister   . Prostate cancer Father   . Prostate cancer Brother   . Stomach cancer Brother   .  Seizures Neg Hx   :  Social History   Socioeconomic History  . Marital status: Legally Separated    Spouse name: Not on file  . Number of children: 1  . Years of education: Not on file  . Highest education level: GED or equivalent  Occupational History  . Occupation: concrete work  Scientific laboratory technician  . Financial resource strain: Not on file  . Food insecurity:    Worry: Not on file    Inability: Not on file  . Transportation needs:    Medical: Not on file    Non-medical: Not on file  Tobacco Use  . Smoking status: Current Every Day Smoker    Packs/day: 1.00    Types: Cigarettes  . Smokeless tobacco: Never Used  Substance and Sexual Activity  . Alcohol use: Yes    Comment: 6-12+ beer/day; update 09/12/18 none since 09/07/18, plans to quit, was drinking 5-6 beers per day  . Drug use: Yes    Types: Marijuana, PCP    Comment: PCP for about 3 months, none since 2017 or 2018  . Sexual activity: Yes  Lifestyle  . Physical activity:    Days per week: Not on file    Minutes per session: Not on file  . Stress: Not on file  Relationships  . Social connections:    Talks on phone: Not on file    Gets together: Not on file    Attends religious service: Not on file    Active member of club or organization: Not on file    Attends meetings of clubs or organizations: Not on file    Relationship status: Not on file  . Intimate partner violence:    Fear of current or ex partner: Not on file    Emotionally abused: Not on file    Physically abused: Not on file    Forced sexual activity: Not on file  Other Topics Concern  . Not on file  Social History Narrative   Lives at home with his mother    Right handed    Caffeine: 1 cup in the mornings  :  Pertinent items are noted in HPI.  Exam:  Blood pressure 120/75, pulse 85, temperature 98.2 F (36.8 C), temperature source Oral, resp. rate 18, height 6\' 1"  (1.854 m), weight 227 lb 1.6 oz (103 kg), SpO2 100 %.   ECOG 0 General  appearance: alert and cooperative appeared without distress. Head: atraumatic without any abnormalities. Eyes: conjunctivae/corneas clear. PERRL.  Sclera anicteric. Throat: lips, mucosa, and tongue normal; without oral thrush or ulcers. Resp: clear to auscultation bilaterally without rhonchi, wheezes or dullness to percussion. Cardio: regular rate and rhythm, S1, S2 normal, no murmur, click, rub or gallop GI: soft, non-tender; bowel sounds normal; no masses,  no organomegaly Skin: Skin color, texture, turgor normal. No rashes or lesions Lymph nodes: Cervical, supraclavicular, and axillary nodes  normal. Neurologic: Grossly normal without any motor, sensory or deep tendon reflexes. Musculoskeletal: Limited mobility in his right shoulder.  CBC    Component Value Date/Time   WBC 12.4 (H) 11/20/2018 1138   WBC 9.8 09/07/2018 0514   RBC 3.63 (L) 11/20/2018 1138   RBC 5.56 09/07/2018 0514   HGB 10.8 (L) 11/20/2018 1138   HCT 32.2 (L) 11/20/2018 1138   PLT 332 11/20/2018 1138   MCV 89 11/20/2018 1138   MCH 29.8 11/20/2018 1138   MCH 30.0 09/07/2018 0514   MCHC 33.5 11/20/2018 1138   MCHC 30.8 09/07/2018 0514   RDW 14.2 11/20/2018 1138   LYMPHSABS 3.7 (H) 11/20/2018 1138   MONOABS 1.1 (H) 09/07/2018 0514   EOSABS 0.4 11/20/2018 1138   BASOSABS 0.1 11/20/2018 1138     Chemistry      Component Value Date/Time   NA 134 11/20/2018 1138   K 3.6 11/20/2018 1138   CL 94 (L) 11/20/2018 1138   CO2 23 11/20/2018 1138   BUN 11 11/20/2018 1138   CREATININE 0.79 11/20/2018 1138      Component Value Date/Time   CALCIUM 9.6 11/20/2018 1138   ALKPHOS 2,365 (HH) 11/21/2018 1101   AST 77 (H) 11/20/2018 1138   ALT 17 11/20/2018 1138   BILITOT 0.5 11/20/2018 1138       Ct Abdomen Pelvis W Contrast  Result Date: 11/29/2018 CLINICAL DATA:  Abdominal pain and bone pain. Evaluate for prostate cancer metastasis. Adenopathy. EXAM: CT ABDOMEN AND PELVIS WITH CONTRAST TECHNIQUE: Multidetector CT  imaging of the abdomen and pelvis was performed using the standard protocol following bolus administration of intravenous contrast. CONTRAST:  128mL OMNIPAQUE IOHEXOL 300 MG/ML  SOLN COMPARISON:  None. FINDINGS: Lower chest: No acute abnormality. Hepatobiliary: Indeterminate low-density structure along the dome of liver measures 1.6 x 1.0 cm, image 9/3. No additional focal liver abnormalities. The gallbladder appears normal. No biliary dilatation. Pancreas: Unremarkable. No pancreatic ductal dilatation or surrounding inflammatory changes. Spleen: Normal in size without focal abnormality. Adrenals/Urinary Tract: Right adrenal gland nodule measures 1.6 cm and 69 HU. Indeterminate. Slightly exophytic intermediate attenuating structure within the upper pole of right kidney measures 2.3 cm and 26 HU. Bilateral renal cortical lobulation identified. No hydronephrosis. Stomach/Bowel: The stomach appears normal. The small bowel loops have a normal course and caliber. The appendix is visualized and appears normal. Normal appearance of the colon. Vascular/Lymphatic: Mild aortic atherosclerosis. No aneurysm. Porta hepatic lymph node is enlarged measuring 1.7 cm, image 24/3. Left external iliac node is enlarged measuring 1.7 cm, image 73/3. Within the left posterior pelvis there is a enlarged lymph node posterior to the prostate gland which measures 1.4 cm, image 81/3. Reproductive: The prostate gland Measures 5.2 by 5.4 by 5.5 cm (volume = 81 cm^3), image 83/3. Other: No abdominal wall hernia or abnormality. No abdominopelvic ascites. Musculoskeletal: Diffuse, predominantly sclerotic bone metastases are identified throughout the axial and appendicular skeleton. A few lytic lesions are also noted including in the right superior pubic rami. Here, lesion measures 1.8 cm, image 85/3. Pathologic fractures are identified within the lower thoracic spine, most notably at T8, T9 and T12. Underlying lytic lesion is identified within the  T8 vertebra which measures approximately 1.3 cm. IMPRESSION: 1. Widespread bone metastases. These are predominantly sclerotic with a few scattered lytic lesions noted. Pathologic compression fractures are noted including T8, T9 and T12. 2. Enlarged porta hepatic, left external iliac and left posterior pelvic lymph nodes compatible with metastatic adenopathy. 3. Prostate gland  enlargement. 4. Small right adrenal nodule, indeterminate. This could be more definitively characterized with adrenal protocol MRI. 5. Indeterminate low-density structure along the dome of liver measures 1.6 cm. This could also be more definitively characterized with contrast enhanced MRI of the abdomen. Electronically Signed   By: Kerby Moors M.D.   On: 11/29/2018 09:31    Assessment and Plan:    53 year old man with the following:  1.  Prostate cancer diagnosed in February 2019.  He presented with PSA 335 and a prostate biopsy confirmed the presence of Gleason score 5+4 = 9.  He has metastatic disease to the bone and lymphadenopathy based on imaging studies.  He was initially diagnosed in 2014 although no definitive treatment was given.  The natural course of advanced castration-sensitive prostate cancer was discussed today with the patient.  He understands he has an incurable malignancy that is rather aggressive and progressive.  He is started on androgen deprivation therapy and the rationale for doing additional treatment was discussed today.  These options include systemic chemotherapy, Nicki Reaper among others.  Given the volume of disease he experiencing, recommend proceeding with Taxotere chemotherapy.  The risks and benefits of this approach was discussed today.  Complication include nausea, vomiting, myelosuppression, neutropenia, neutropenic sepsis among others were reviewed.  The benefit would be improvement in his overall survival and improvement in his pain scores.  Taxotere will be given at 75 mg per metered  square every 3 weeks for total of 6 cycles.  After discussion today is agreeable to proceed with this plan after chemo education class.  He will have a bone scan as well to complete the staging.  2.  IV access: Peripheral veins will be in use at this time will defer the use for Port-A-Cath.  3.  Antiemetics: Prescription for Compazine was given to him.  4.  Androgen deprivation: This will continue indefinitely and he is currently receiving it under the care of Dr. Erlene Quan.  5.  Compression fracture of his lumbar spine: This appears to be pathological in nature and causing him significant pain.  I will obtain MRI of the spine and to make appropriate referral for interventional radiology as well as radiation oncology.  He will likely require kyphoplasty and radiation or possibly both.  6.  Pain: Prescription for Percocet was refilled for him.  Long-acting medication may be required his pain continues.  7.  Bone directed therapy: This option will be deferred after obtaining dental clearance.  He might require dental evaluation and possible dental work prior to proceeding with Delton See.  8.  Prognosis and goals of care: Therapy is palliative at this time although his performance status is excellent and aggressive therapy is warranted.  9.  Seizures: Does not appear to be of a malignant etiology.  Is currently on antiseizure medication.  10.  Follow-up: We will be in the immediate future to start chemotherapy.  60  minutes was spent with the patient face-to-face today.  More than 50% of time was dedicated to reviewing disease status, imaging studies, natural course of his disease and treatment options.    Thank you for the referral.  A copy of this consult has been forwarded to the requesting physician.

## 2018-12-20 NOTE — Progress Notes (Signed)
Introduced myself to Roy Rosales as the prostate nurse navigator and my role. He was diagnosed with prostate cancer in 2014 but states he did not receive treatment due to insurance. He consulted today with Dr. Alen Blew for metastatic prostate cancer. He received Firmagon 240 mg with Dr. Erlene Quan his urologist 12/04/18. Dr. Alen Blew has recommended Taxotere and has referred him to radiation oncologist to evaluate his spinal mets. He is scheduled for chemo education 2/24 and will begin chemo 3/5. I stressed the importance of coming to his appointments in order to get the cancer under control. I asked if he has family support and he has a significant other who is mother to his 21 year old son. I informed him of our prostate support group here at [facility] and our support services.  I gave him my business card and asked him to call me with questions or concerns. I will continue to follow.

## 2018-12-20 NOTE — Progress Notes (Signed)
START ON PATHWAY REGIMEN - Prostate     A cycle is every 21 days:     Docetaxel   **Always confirm dose/schedule in your pharmacy ordering system**  Patient Characteristics: Adenocarcinoma, Distant Metastases, Hormone Naive Histology: Adenocarcinoma Therapeutic Status: Distant Metastases  Intent of Therapy: Non-Curative / Palliative Intent, Discussed with Patient 

## 2018-12-20 NOTE — Telephone Encounter (Signed)
Gave avs and calendar. Patient informed that he will be notified of 03/05 chemo appt once/if approved

## 2018-12-23 ENCOUNTER — Telehealth: Payer: Self-pay | Admitting: *Deleted

## 2018-12-23 ENCOUNTER — Encounter: Payer: Self-pay | Admitting: Oncology

## 2018-12-23 ENCOUNTER — Telehealth: Payer: Self-pay | Admitting: Medical Oncology

## 2018-12-23 ENCOUNTER — Inpatient Hospital Stay: Payer: No Typology Code available for payment source

## 2018-12-23 NOTE — Telephone Encounter (Signed)
rec'd call from lenise financial advisor at cancer center. States patient's percocet script [facility] and he would have to pay out of pocket. Please send new script to [facility] long outpatient pharmacy, for no out of pocket expense, d/t financial issues.

## 2018-12-23 NOTE — Telephone Encounter (Signed)
Mr. Nemetz left me a message asking if we can call his pain medication into a different pharmacy. He qualified for financial assistance and it needs to be transferred to [facility] outpatient pharmacy. This message forwarded to [facility]- Dr. Hazeline Junker nurse.

## 2018-12-23 NOTE — Progress Notes (Signed)
Met with patient after chemo ed class referred by chemo ed RN.  Introduced myself as Arboriculturist and to offer available resources.  Discussed one-time $56 Engineer, drilling to assist with personal expenses while going through treatment. Approved for the grant based on letter of support in Epic for [facility] FAA. Patient states he has applied for the orange card at [place] as well and will follow up on next week when he goes there. He states he hasn't received anything from the [facility] FAA as of yet.  Approved for both one-time $700 [place] and the $200 Prostate grants. Went over in detail how to use the grant with the expenses it will cover including medication. He states he will use [facility] pharmacy the next time but he is going to [facility]. Gave him a copy of the approval letter as well as expense sheet along with the Outpatient pharmacy information.  He has my card for any additional financial questions or concerns.   Sent a staff message to the social workers regarding concern he had with applying for disability.  Reached out to Rob to give a heads up regarding treatment drugs for him being uninsured as of now.

## 2018-12-24 ENCOUNTER — Telehealth: Payer: Self-pay | Admitting: Oncology

## 2018-12-24 ENCOUNTER — Other Ambulatory Visit: Payer: Self-pay | Admitting: Oncology

## 2018-12-24 MED ORDER — OXYCODONE-ACETAMINOPHEN 10-325 MG PO TABS: 1.0000 | ORAL_TABLET | ORAL | 0 refills | Status: DC | PRN

## 2018-12-24 MED FILL — OXYCODONE-APAP 10-325: 10-325 | 10 days supply | Qty: 60 | Fill #0

## 2018-12-24 MED FILL — PROCHLORPERAZINE 10 MG TAB: 10 | 7 days supply | Qty: 30 | Fill #0

## 2018-12-24 NOTE — Telephone Encounter (Signed)
Called patient to inform approval of infusion on 03/5. Spoke with patient. Gave time and date of appts on 03/04 and 03/05. Declined mailed confirmation

## 2018-12-30 ENCOUNTER — Telehealth: Payer: Self-pay | Admitting: Medical Oncology

## 2018-12-30 ENCOUNTER — Encounter: Payer: Self-pay | Admitting: Radiation Oncology

## 2018-12-30 NOTE — Telephone Encounter (Signed)
Spoke with radiology scheduling spoke with Roy Rosales to see why scans have not been scheduled. I was informed they have tried to reach patient x 3 without a return call. I asked Roy Rosales to schedule the scans and if she is unable to reach patient call me back. She called stating scans scheduled for 3/6 and she spoke with patient. She states Roy Rosales seemed a little overwhelmed. I called him to discuss appointments scheduled for the week. We reviewed the appointments and what will take place. I explained Dr. Tammi Klippel may postpone his radiation consults until we get scans. I informed him that I will call him back if these appointments are rescheduled. He voiced understanding.

## 2018-12-30 NOTE — Progress Notes (Signed)
Histology and Location of Primary Cancer: advanced prostate cancer  Sites of Visceral and Bony Metastatic Disease: widespread metastatic disease with pathologic compression fractures at T8, T9, and T12, enlarged porta hepatis and left external iliac and left posterior pelvic lymph nodes  Location(s) of Symptomatic Metastases: pathologic compression fractures at T8, T9 and T12  Past/Anticipated chemotherapy by medical oncology, if any: Firmagon injection at 240 mg given on 12/04/2018 by Dr. Erlene Quan. Shadad plans to proceed with Taxotere chemotherapy for 6 cycles.   Pain on a scale of 0-10 is: Reports both back and shoulder pain requiring Percocet. Reports taking 1-2 percocet tablets every 2-3 hours. Reports shoulder pain is chronic in nature due to old injury. Reports intermittent left lower rib pain. Reports despite taking two percocet tablets at 0400 and 1 percocet tablet at 0700 his pain is 10/10.     If Spine Met(s), symptoms, if any, include:  Bowel/Bladder retention or incontinence (please describe): Denies urinary frequency, urgency or hematuria. Denies diarrhea or constipation. Reports urinary HESITANCY intermittently.  Numbness or weakness in extremities (please describe): Reports numbness in his legs. Reports an episode of right side chin numbness one week ago.   Current Decadron regimen, if applicable: no  Ambulatory status? Walker? Wheelchair?: Ambulatory  SAFETY ISSUES:  Prior radiation? no  Pacemaker/ICD? no  Possible current pregnancy? no, male patient  Is the patient on methotrexate? no  Current Complaints / other details:  53 year old male. Legally separated. Has one child. 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 Reports he had his last seizure in November 2019.  Resides in [place]. Denies any recent falls.    Breast ca Mother Prostate ca Father and brother Stomach ca Brother

## 2018-12-31 ENCOUNTER — Other Ambulatory Visit: Payer: Self-pay

## 2018-12-31 ENCOUNTER — Encounter: Payer: Self-pay | Admitting: Medical Oncology

## 2018-12-31 ENCOUNTER — Ambulatory Visit
Admission: RE | Admit: 2018-12-31 | Discharge: 2018-12-31 | Disposition: A | Discharge status: Home / Self Care | Payer: Medicaid Other | Source: Ambulatory Visit | Attending: Radiation Oncology | Admitting: Radiation Oncology

## 2018-12-31 ENCOUNTER — Other Ambulatory Visit: Payer: Self-pay | Admitting: Urology

## 2018-12-31 ENCOUNTER — Inpatient Hospital Stay: Payer: Medicaid Other | Admitting: *Deleted

## 2018-12-31 ENCOUNTER — Encounter: Payer: Self-pay | Admitting: Radiation Oncology

## 2018-12-31 VITALS — BP 123/73 | HR 81 | Temp 99.3°F | Resp 16 | Ht 73.0 in | Wt 226.0 lb

## 2018-12-31 DIAGNOSIS — J449 Chronic obstructive pulmonary disease, unspecified: Secondary | ICD-10-CM | POA: Insufficient documentation

## 2018-12-31 DIAGNOSIS — Z800 Family history of malignant neoplasm of digestive organs: Secondary | ICD-10-CM | POA: Insufficient documentation

## 2018-12-31 DIAGNOSIS — Z803 Family history of malignant neoplasm of breast: Secondary | ICD-10-CM | POA: Diagnosis not present

## 2018-12-31 DIAGNOSIS — I10 Essential (primary) hypertension: Secondary | ICD-10-CM | POA: Diagnosis not present

## 2018-12-31 DIAGNOSIS — C7951 Secondary malignant neoplasm of bone: Secondary | ICD-10-CM | POA: Insufficient documentation

## 2018-12-31 DIAGNOSIS — Z79899 Other long term (current) drug therapy: Secondary | ICD-10-CM | POA: Insufficient documentation

## 2018-12-31 DIAGNOSIS — F17210 Nicotine dependence, cigarettes, uncomplicated: Secondary | ICD-10-CM | POA: Insufficient documentation

## 2018-12-31 DIAGNOSIS — Z510 Encounter for antineoplastic radiation therapy: Secondary | ICD-10-CM | POA: Diagnosis not present

## 2018-12-31 DIAGNOSIS — Z79891 Long term (current) use of opiate analgesic: Secondary | ICD-10-CM | POA: Insufficient documentation

## 2018-12-31 DIAGNOSIS — C61 Malignant neoplasm of prostate: Secondary | ICD-10-CM | POA: Insufficient documentation

## 2018-12-31 DIAGNOSIS — R590 Localized enlarged lymph nodes: Secondary | ICD-10-CM | POA: Diagnosis not present

## 2018-12-31 DIAGNOSIS — M545 Low back pain: Secondary | ICD-10-CM | POA: Insufficient documentation

## 2018-12-31 DIAGNOSIS — M8458XA Pathological fracture in neoplastic disease, other specified site, initial encounter for fracture: Secondary | ICD-10-CM | POA: Insufficient documentation

## 2018-12-31 DIAGNOSIS — C7952 Secondary malignant neoplasm of bone marrow: Secondary | ICD-10-CM

## 2018-12-31 DIAGNOSIS — G893 Neoplasm related pain (acute) (chronic): Secondary | ICD-10-CM | POA: Diagnosis not present

## 2018-12-31 DIAGNOSIS — Z8042 Family history of malignant neoplasm of prostate: Secondary | ICD-10-CM | POA: Insufficient documentation

## 2018-12-31 DIAGNOSIS — Z791 Long term (current) use of non-steroidal anti-inflammatories (NSAID): Secondary | ICD-10-CM | POA: Diagnosis not present

## 2018-12-31 MED ORDER — MORPHINE SULFATE ER 60 MG PO TBCR: 60.0000 mg | EXTENDED_RELEASE_TABLET | Freq: Two times a day (BID) | ORAL | 0 refills | Status: DC

## 2018-12-31 MED FILL — MORPHINE SULF 60 MG TAB SA: 60 | 30 days supply | Qty: 60 | Fill #0

## 2018-12-31 NOTE — Progress Notes (Signed)
See progress note under physician encounter. 

## 2018-12-31 NOTE — Progress Notes (Signed)
Roy Rosales  Clinical Social Rosales was referred by Mining engineer for assessment of psychosocial needs.  Clinical Social Worker met with patient after radiation oncology appointment  to offer support and assess for needs.  Roy Rosales is interested in applying for social security disability- CSW will send referral to [facility] disability assistance program.   CSW enrolled patient in [place] to assist with gas costs to treatment.  CSW briefly reviewed CSW role and discussed emotional impact of cancer.  Roy Rosales is interested in meeting with CSW for counseling appointment in the future.      Gwinda Maine, LCSW  Clinical Social Worker [facility]

## 2018-12-31 NOTE — Progress Notes (Signed)
Per Freeman Caldron, PA-C request I gave and reviewed the following note with the patient. Patient verbalized understanding via [place].   TAKE ONE TABLET OF MS CONTIN EVERY 12 HOURS.   MS CONTIN IS A LONG ACTING PAIN MEDICATION AND SHOULD BE TAKEN ABOUT THE SAME TIME EACH DAY.  EXAMPLE: ONE TABLET AT 7 AM THEN ONE TABLET AT 7 PM    PERCOCET SHOULD BE TAKEN FOR BREAK THROUGH PAIN/PAIN THAT OCCURS BETWEEN YOUR DOSES OF MS CONTIN.   TAKE ONE TABLET OF PERCOCET EVERY 4-6 HOURS FOR BREAK THROUGH PAIN.  THESE ARE POWERFUL MEDICATIONS. PLEASE TAKE ONLY AS PRESCRIBED.

## 2018-12-31 NOTE — Progress Notes (Signed)
Radiation Oncology         [phone]) [phone] ________________________________  Initial outpatient Consultation  Name: Roy Rosales MRN: [id number]  Date: 12/31/2018  DOB: 1965/11/05  LS:LHTDSKA, [place], MD   REFERRING PHYSICIAN: Wyatt Portela, MD  DIAGNOSIS: 53 y.o. 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    ICD-10-CM   1. Secondary malignant neoplasm of bone and bone marrow (HCC) C79.51    C79.52   2. Prostate cancer metastatic to bone [facility]) C61    C79.51     HISTORY OF PRESENT ILLNESS: Roy Rosales is a 53 y.o. male with a recent diagnosis of metastatic prostate cancer. He reports that he was initially diagnosed with low grade, Gleason 3+3 adenocarcinoma of the prostate in 2014 and was scheduled to undergo prostatectomy with Dr. Louis Meckel but changed his mind and decided that he would prefer to have brachytherapy.  Unfortunately, there were some issues with his insurance covering the brachii therapy procedure at that time and he eventually failed to follow-up.  Per Dr. Cherrie Gauze recent note from 12/04/18, the patient has extensive history of psychiatric and substance abuse as well as socioeconomic factors contributing to his history of noncompliance and lack of follow-up.   He recently presented to the ED for new onset seizure disorder in 06/2018 and most recently on 09/07/2018 and was incidentally found to have elevated alkaline phosphatase over 400 on labs performed at time of admission.  A CT head was performed at each of those ER visits and were negative for any acute findings.  He was referred for follow up through the [facility] with Dr. Asencion Noble on 09/12/2018 and underwent repeat labs on 11/21/2018, with results showing PSA elevation at 335 and ALP further elevated at 2365.  He had a CT A/P  on 11/28/18 which revealed widespread metastatic disease to the bone with pathologic compression fractures at T8, T9 and T12 as well as enlarged porta hepatis, pelvic lymphadenopathy, small right adrenal nodule, and an indeterminate low-density structure along the dome of liver measuring 1.6 cm.  Accordingly, he was referred for evaluation in urology by Dr. Hollice Espy on 12/04/2018 and proceeded to transrectal ultrasound with 12 biopsies of the prostate that same day.  The prostate volume measured 81 cc.  Out of 6 core biopsies, all 6 were positive.  The maximum Gleason score was 5+4, and this was seen in left base, left mid, right base, right mid, and right apex. Additionally, Gleason 4+5 was noted in left apex. The patient received his intial Firmagon 240 mg injection on 12/04/18.  He met with Dr. Alen Blew in consult on 12/20/2018 who recommended further disease staging with Bone scan and further evaluation of his back pain/ compression fractures with MRI lumbar spine which is scheduled for 01/03/2019. He is scheduled to start his first cycle of chemotherapy with Taxotere on 01/02/19.   The patient reviewed the biopsy and imaging results with Dr. Alen Blew and he has kindly been referred today for discussion of potential radiation treatment options.  PREVIOUS RADIATION THERAPY: No  PAST MEDICAL HISTORY:  Past Medical History:  Diagnosis Date  . COPD (chronic obstructive pulmonary disease) ([place])   . Hypertension   . Polysubstance abuse ([place])   . Prostate cancer [facility])       PAST SURGICAL HISTORY: Past Surgical History:  Procedure Laterality  Date  . NO PAST SURGERIES    . PROSTATE BIOPSY      FAMILY HISTORY:  Family History  Problem Relation Age of Onset  . Breast cancer Mother   . Hypertension Mother   . Cancer Sister   . Prostate cancer Father   . Prostate cancer Brother   . Stomach cancer Brother   . Seizures Neg Hx     SOCIAL HISTORY:  Social History   Socioeconomic History  .  Marital status: Legally Separated    Spouse name: Not on file  . Number of children: 1  . Years of education: Not on file  . Highest education level: GED or equivalent  Occupational History  . Occupation: concrete work    Comment: out of work since September 2019  Social Needs  . Financial resource strain: Not on file  . Food insecurity:    Worry: Not on file    Inability: Not on file  . Transportation needs:    Medical: Not on file    Non-medical: Not on file  Tobacco Use  . Smoking status: Current Every Day Smoker    Packs/day: 1.00    Types: Cigarettes  . Smokeless tobacco: Never Used  Substance and Sexual Activity  . Alcohol use: Yes    Comment: 6-12+ beer/day; update 09/12/18 none since 09/07/18, plans to quit, was drinking 5-6 beers per day  . Drug use: Yes    Types: Marijuana, PCP    Comment: PCP for about 3 months, none since 2017 or 2018  . Sexual activity: Not Currently  Lifestyle  . Physical activity:    Days per week: Not on file    Minutes per session: Not on file  . Stress: Not on file  Relationships  . Social connections:    Talks on phone: Not on file    Gets together: Not on file    Attends religious service: Not on file    Active member of club or organization: Not on file    Attends meetings of clubs or organizations: Not on file    Relationship status: Not on file  . Intimate partner violence:    Fear of current or ex partner: Not on file    Emotionally abused: Not on file    Physically abused: Not on file    Forced sexual activity: Not on file  Other Topics Concern  . Not on file  Social History Narrative   Lives at home with his mother    Right handed    Caffeine: 1 cup in the mornings    ALLERGIES: Patient has no known allergies.  MEDICATIONS:  Current Outpatient Medications  Medication Sig Dispense Refill  . albuterol (PROVENTIL HFA;VENTOLIN HFA) 108 (90 Base) MCG/ACT inhaler Inhale 2 puffs into the lungs every 4 (four) hours as  needed. 1 Inhaler 2  . atenolol (TENORMIN) 100 MG tablet Take 1 tablet (100 mg total) by mouth daily. 30 tablet 6  . celecoxib (CELEBREX) 100 MG capsule Take 1 capsule (100 mg total) by mouth 2 (two) times daily. 60 capsule 3  . diazepam (DIASTAT ACUDIAL) 10 MG GEL Place 10 mg rectally once as needed for up to 1 dose for seizure. 1 Package 0  . levETIRAcetam (KEPPRA) 500 MG tablet Take 1 tablet (500 mg total) by mouth 2 (two) times daily. 60 tablet 11  . prochlorperazine (COMPAZINE) 10 MG tablet Take 1 tablet (10 mg total) by mouth every 6 (six) hours as needed for nausea or  vomiting. 30 tablet 0  . triamterene-hydrochlorothiazide (MAXZIDE) 75-50 MG tablet Take 1 tablet by mouth daily. 30 tablet 6  . valACYclovir (VALTREX) 500 MG tablet Take 500 mg by mouth daily.    Marland Kitchen doxycycline (VIBRA-TABS) 100 MG tablet Take 1 tablet (100 mg total) by mouth 2 (two) times daily. After eating to treat bronchitis 20 tablet 0  . gabapentin (NEURONTIN) 300 MG capsule Take 1 capsule (300 mg total) by mouth 3 (three) times daily. Start with 1 pill at bedtime for the first week then increase if tolerated 90 capsule 6  . morphine (MS CONTIN) 60 MG 12 hr tablet Take 1 tablet (60 mg total) by mouth every 12 (twelve) hours. 60 tablet 0  . oxyCODONE-acetaminophen (PERCOCET) 10-325 MG tablet Take 1 tablet by mouth every 4 (four) hours as needed for pain. 60 tablet 0  . polyethylene glycol powder (GLYCOLAX/MIRALAX) powder Take 17 g by mouth daily. Mixed with at least 8 ounces of water to treat constipation 3350 g 11   No current facility-administered medications for this encounter.     REVIEW OF SYSTEMS:  On review of systems, the patient reports that he is doing relatively well although he is not sleeping well due to severe back pain. He reports mid-low back pain that has been progressively worsening over the past 3-4 months. The pain does occasionally shoot down his legs bilaterally and radiates around into the flanks  bilaterally. The pain is worse when lying flat to rest at night and with any activity. The pain is slightly improved with bending forward at the waist. He does have some intermittent tingling and weakness in the LEs bilaterally, ongoing for the past several weeks.  He also reports chronic right shoulder pain associated with an old shoulder injury.  He denies any chest pain, shortness of breath, cough, fevers, chills, night sweats, or unintended weight changes. He denies abdominal pain, nausea or vomiting. He also reports a single episode of right sided chin numbness a week ago. He notes constipation associated with the percocet he takes for pain. A complete review of systems is obtained and is otherwise negative.   PHYSICAL EXAM:  Wt Readings from Last 3 Encounters:  01/02/19 222 lb (100.7 kg)  01/01/19 225 lb (102.1 kg)  12/31/18 226 lb (102.5 kg)   Temp Readings from Last 3 Encounters:  01/04/19 98.3 F (36.8 C) (Oral)  01/02/19 98.7 F (37.1 C) (Oral)  01/01/19 98.7 F (37.1 C) (Oral)   BP Readings from Last 3 Encounters:  01/04/19 137/84  01/02/19 109/63  01/01/19 114/76   Pulse Readings from Last 3 Encounters:  01/04/19 85  01/02/19 73  01/01/19 76    9-10/10 mid-lower back pain despite taking Percocet.  In general this is a well appearing African American gentleman in no acute distress. He is alert and oriented x4 and appropriate throughout the examination. HEENT reveals that the patient is normocephalic, atraumatic. EOMs are intact. PERRLA. Skin is intact without any evidence of gross lesions. Cardiovascular exam reveals a regular rate and rhythm, no clicks rubs or murmurs are auscultated. Chest is clear to auscultation bilaterally. Lymphatic assessment is performed and does not reveal any adenopathy in the cervical, supraclavicular, axillary, or inguinal chains. Abdomen has active bowel sounds in all quadrants and is intact. The abdomen is soft, non tender, non distended. Lower  extremities are negative for pretibial pitting edema, deep calf tenderness, cyanosis or clubbing.  Sensation is intact to light touch in bilateral upper and lower extremities  and strength is 5 out of 5 and equal bilaterally.  KPS = 70  100 - Normal; no complaints; no evidence of disease. 90   - Able to carry on normal activity; minor signs or symptoms of disease. 80   - Normal activity with effort; some signs or symptoms of disease. 54   - Cares for self; unable to carry on normal activity or to do active work. 60   - Requires occasional assistance, but is able to care for most of his personal needs. 50   - Requires considerable assistance and frequent medical care. 71   - Disabled; requires special care and assistance. 30   - Severely disabled; hospital admission is indicated although death not imminent. 79   - Very sick; hospital admission necessary; active supportive treatment necessary. 10   - Moribund; fatal processes progressing rapidly. 0     - Dead  Karnofsky DA, Abelmann Nevis, Craver LS and Burchenal [facility] [phone]) The use of the nitrogen mustards in the palliative treatment of carcinoma: with particular reference to bronchogenic carcinoma Cancer 1 [id number]  LABORATORY DATA:  Lab Results  Component Value Date   WBC 7.4 01/01/2019   HGB 10.1 (L) 01/01/2019   HCT 32.6 (L) 01/01/2019   MCV 95.0 01/01/2019   PLT 263 01/01/2019   Lab Results  Component Value Date   NA 138 01/01/2019   K 3.4 (L) 01/01/2019   CL 103 01/01/2019   CO2 27 01/01/2019   Lab Results  Component Value Date   ALT 16 01/01/2019   AST 23 01/01/2019   GGT 64 11/21/2018   ALKPHOS 2,910 (H) 01/01/2019   BILITOT 0.6 01/01/2019     RADIOGRAPHY: Mr Thoracic Spine W Wo Contrast  Result Date: 01/03/2019 CLINICAL DATA:  Metastatic prostate cancer. EXAM: MRI THORACIC AND LUMBAR SPINE WITHOUT AND WITH CONTRAST TECHNIQUE: Multiplanar and multiecho pulse sequences of the thoracic and lumbar spine were obtained without  and with intravenous contrast. CONTRAST:  10 mL Gadavist intravenous contrast. COMPARISON:  CT abdomen pelvis dated November 28, 2018. Chest x-ray dated July 12, 2018. FINDINGS: MRI THORACIC SPINE FINDINGS Alignment:  Physiologic. Vertebrae: Diffusely decreased T2 and T1 marrow signal with patchy STIR hyperintensity and enhancement involving the thoracic vertebral bodies and posterior elements, consistent with widespread osseous metastatic disease. There is bulging of the posterior vertebral cortices at T3, T6, and T7 with epidural tumor. There are chronic moderate T8 and T9 compression deformities. There are chronic mild T3 and T12 superior endplate compression deformities. No acute fracture or evidence of discitis. Cord: Normal signal. Right anterior and lateral epidural tumor at T3 abutting the cord. Near circumferential epidural tumor tumor at the level of T6 resulting in moderate to severe spinal canal stenosis with deformity of the cord. Bilateral anterior epidural tumor at T7 abutting the ventral cord. No intradural enhancement. Paraspinal and other soft tissues: Diffuse metastases of the bilateral posterior ribs with pathologic fracture of the left posterior sixth rib. Disc levels: T1-T2: Minimal disc bulging.  No stenosis. T2-T3: Negative. T3-T4: Negative. T4-T5: Negative. T5-T6: Negative disc. Near circumferential epidural tumor at the level of T6 resulting in moderate to severe spinal canal stenosis with deformity of the cord. Mild left neuroforaminal stenosis due to epidural tumor. T6-T7: Moderate spinal canal stenosis. Moderate to severe bilateral neuroforaminal stenosis due to epidural tumor. T7-T8: Tiny central disc protrusion.  No stenosis. T8-T9: Tiny central disc protrusion.  No stenosis. T9-T10: Negative disc. Mild left neuroforaminal stenosis. No spinal canal or right  neuroforaminal stenosis. T10-T11: Minimal disc bulging.  No stenosis. T11-T12: Minimal disc bulging.  No stenosis. MRI LUMBAR  SPINE FINDINGS Segmentation:  Standard. Alignment:  Trace anterolisthesis at L3-L4. Vertebrae: Diffusely decreased T1 and T2 marrow signal with patchy STIR hyperintensity and enhancement involving the lumbar vertebral bodies, posterior elements, and visualized pelvis, consistent with widespread osseous metastatic disease. Small Schmorl's node along the L1 superior endplate. No acute fracture or evidence of discitis. Conus medullaris: Extends to the T12 level and appears normal. No intradural enhancement. No epidural tumor. Paraspinal and other soft tissues: Negative. Disc levels: T12-L1:  Negative. L1-L2:  Negative. L2-L3:  Shallow biforaminal disc protrusions.  No stenosis. L3-L4: Mild disc bulging and moderate bilateral facet arthropathy. Borderline mild spinal canal stenosis. Mild right lateral recess stenosis. Mild bilateral neuroforaminal stenosis. L4-L5: Mild disc bulging and bilateral facet arthropathy. Mild right neuroforaminal stenosis. No spinal canal or left neuroforaminal stenosis. L5-S1: Normal disc. Mild bilateral facet arthropathy. No stenosis. IMPRESSION: Thoracic spine: 1. Widespread osseous metastatic disease involving the thoracic spine. Near circumferential epidural tumor at the level of T6 with moderate to severe spinal canal stenosis and deformity of the cord. No abnormal cord signal. 2. Additional epidural tumor at T3 and T7 abutting the cord. Moderate to severe bilateral neuroforaminal stenosis at T6-T7 due to epidural tumor. 3. Chronic T3, T8, T9, and T12 compression deformities. No acute fracture. Lumbar spine: 1. Widespread osseous metastatic disease involving the lumbar spine and visualized pelvis. No epidural tumor or compression fracture. 2. Mild lumbar spondylosis as described above. Electronically Signed   By: Titus Dubin M.D.   On: 01/03/2019 17:36   Mr Lumbar Spine W Wo Contrast  Result Date: 01/03/2019 CLINICAL DATA:  Metastatic prostate cancer. EXAM: MRI THORACIC AND LUMBAR  SPINE WITHOUT AND WITH CONTRAST TECHNIQUE: Multiplanar and multiecho pulse sequences of the thoracic and lumbar spine were obtained without and with intravenous contrast. CONTRAST:  10 mL Gadavist intravenous contrast. COMPARISON:  CT abdomen pelvis dated November 28, 2018. Chest x-ray dated July 12, 2018. FINDINGS: MRI THORACIC SPINE FINDINGS Alignment:  Physiologic. Vertebrae: Diffusely decreased T2 and T1 marrow signal with patchy STIR hyperintensity and enhancement involving the thoracic vertebral bodies and posterior elements, consistent with widespread osseous metastatic disease. There is bulging of the posterior vertebral cortices at T3, T6, and T7 with epidural tumor. There are chronic moderate T8 and T9 compression deformities. There are chronic mild T3 and T12 superior endplate compression deformities. No acute fracture or evidence of discitis. Cord: Normal signal. Right anterior and lateral epidural tumor at T3 abutting the cord. Near circumferential epidural tumor tumor at the level of T6 resulting in moderate to severe spinal canal stenosis with deformity of the cord. Bilateral anterior epidural tumor at T7 abutting the ventral cord. No intradural enhancement. Paraspinal and other soft tissues: Diffuse metastases of the bilateral posterior ribs with pathologic fracture of the left posterior sixth rib. Disc levels: T1-T2: Minimal disc bulging.  No stenosis. T2-T3: Negative. T3-T4: Negative. T4-T5: Negative. T5-T6: Negative disc. Near circumferential epidural tumor at the level of T6 resulting in moderate to severe spinal canal stenosis with deformity of the cord. Mild left neuroforaminal stenosis due to epidural tumor. T6-T7: Moderate spinal canal stenosis. Moderate to severe bilateral neuroforaminal stenosis due to epidural tumor. T7-T8: Tiny central disc protrusion.  No stenosis. T8-T9: Tiny central disc protrusion.  No stenosis. T9-T10: Negative disc. Mild left neuroforaminal stenosis. No spinal  canal or right neuroforaminal stenosis. T10-T11: Minimal disc bulging.  No stenosis. T11-T12:  Minimal disc bulging.  No stenosis. MRI LUMBAR SPINE FINDINGS Segmentation:  Standard. Alignment:  Trace anterolisthesis at L3-L4. Vertebrae: Diffusely decreased T1 and T2 marrow signal with patchy STIR hyperintensity and enhancement involving the lumbar vertebral bodies, posterior elements, and visualized pelvis, consistent with widespread osseous metastatic disease. Small Schmorl's node along the L1 superior endplate. No acute fracture or evidence of discitis. Conus medullaris: Extends to the T12 level and appears normal. No intradural enhancement. No epidural tumor. Paraspinal and other soft tissues: Negative. Disc levels: T12-L1:  Negative. L1-L2:  Negative. L2-L3:  Shallow biforaminal disc protrusions.  No stenosis. L3-L4: Mild disc bulging and moderate bilateral facet arthropathy. Borderline mild spinal canal stenosis. Mild right lateral recess stenosis. Mild bilateral neuroforaminal stenosis. L4-L5: Mild disc bulging and bilateral facet arthropathy. Mild right neuroforaminal stenosis. No spinal canal or left neuroforaminal stenosis. L5-S1: Normal disc. Mild bilateral facet arthropathy. No stenosis. IMPRESSION: Thoracic spine: 1. Widespread osseous metastatic disease involving the thoracic spine. Near circumferential epidural tumor at the level of T6 with moderate to severe spinal canal stenosis and deformity of the cord. No abnormal cord signal. 2. Additional epidural tumor at T3 and T7 abutting the cord. Moderate to severe bilateral neuroforaminal stenosis at T6-T7 due to epidural tumor. 3. Chronic T3, T8, T9, and T12 compression deformities. No acute fracture. Lumbar spine: 1. Widespread osseous metastatic disease involving the lumbar spine and visualized pelvis. No epidural tumor or compression fracture. 2. Mild lumbar spondylosis as described above. Electronically Signed   By: Titus Dubin M.D.   On:  01/03/2019 17:36   Nm Bone Scan Whole Body  Result Date: 01/03/2019 CLINICAL DATA:  Metastatic prostate cancer to bone EXAM: NUCLEAR MEDICINE WHOLE BODY BONE SCAN TECHNIQUE: Whole body anterior and posterior images were obtained approximately 3 hours after intravenous injection of radiopharmaceutical. RADIOPHARMACEUTICALS:  19.4 mCi Technetium-69mMDP IV COMPARISON:  None Radiographic correlation: CT abdomen/pelvis 11/28/2018, MRI thoracic/lumbar spine 01/03/2019 FINDINGS: Significant diffuse abnormal increased osseous tracer accumulation is identified throughout multiple osseous structures consistent with widespread osseous metastatic disease. This includes calvaria, clavicles, scapula, BILATERAL humeri, sternum, BILATERAL ribs, cervical/thoracic/lumbar spine, pelvis, and BILATERAL femora. Paucity of soft tissue and nearly absent urinary tract localization of tracer consistent with "super scan". IMPRESSION: Widespread osseous metastatic disease involving the axial and appendicular skeleton as above. This includes extensive abnormalities at the humeri and femora bilaterally. Electronically Signed   By: MLavonia DanaM.D.   On: 01/03/2019 17:47      IMPRESSION/PLAN: 1. 53y.o. gentleman with newly diagnosed advanced metastatic adenocarcinoma of the prostate with Gleason Score of 5+4, and PSA of 335 with painful bony metastatic disease in the thoracic spine. We discussed the patient's workup and outlined the nature of metastatic prostate cancer and the role of palliative radiotherapy in this setting. We discussed the available radiation techniques, and focused on the details of logistics and delivery.  The recommendation is to proceed with a 10-day course of daily palliative radiotherapy to the painful metastatic disease at T8, T9 and T12 delivered over a period of 2 weeks.  We discussed and outlined the risks, benefits, short and long-term effects associated with radiotherapy in this setting. He was encouraged  to ask questions that were answered to his stated satisfaction.  At the end of the conversation the patient is interested in proceeding with palliative radiotherapy directed to the metastatic disease in the thoracic spine.  He has freely signed written consent to proceed today in the office and a copy of this document  has been placed in his medical record.  He will proceed with CT simulation later this morning in anticipation of beginning his radiotherapy on 01/01/2019. He is scheduled to start his first cycle of chemotherapy with Taxotere on 01/02/19 and after speaking with Dr. Alen Blew, we will proceed as planned with chemotherapy concurrent with his planned radiation treatment.  Given the fact that at present, he remains neurologically intact, we will hold off on prescribing steroids unless otherwise indicated on his upcoming imaging.  He will proceed as scheduled with MRI of the spine and bone scan on 01/03/2019. He appears to have a good understanding of his disease and our recommendations and is in agreement with the stated plan.  He knows to call with any questions or concerns.  2. Severe mid-low back pain.  Pain secondary to his know metastatic disease in the spine.  He is currently taking Percocet more often than prescribed due to uncontrolled pain.  Even with Percocet, he reports that his pain remains 8-9/10 in severity as it only takes the edge off and only lasts approximately 2-3 hours. I am going to try switching him to a long acting narcotic pain medication to see if we can get better, sustained control of his pain.  A prescription for MS Contin 56m has been sent to his pharmacy with instructions to take one tablet twice daily.  He was also provided with written instruction that he could continue to use Percocet 5/3273mevery 4-6 hours as needed for breakthrough pain. Hopefully, this will allow him to better tolerate his daily radiation treatments and provide better overall pain control.  I will place a  referral to MaTerese DoorNP with Palliative Care and arrange for him to be seen on Monday, 01/06/19 for further assessment of his symptom management.  We spent 60 minutes face to face with the patient and more than 50% of that time was spent in counseling and/or coordination of care.    AsNicholos JohnsPA-C    MaTyler PitaMD  [facility] Direct Dial: [phone]: [phone]  Skype  LinkedIn   This document serves as a record of services personally performed by MaTyler PitaMD and AsFreeman CaldronPA-C. It was created on their behalf by KaWilburn Mylara trained medical scribe. The creation of this record is based on the scribe's personal observations and the provider's statements to them. This document has been checked and approved by the attending provider.

## 2018-12-31 NOTE — Progress Notes (Signed)
  Radiation Oncology         [phone] ________________________________  Name: AMEAR STROJNY MRN: [id number]  Date: 12/31/2018  DOB: Sep 16, 1966  SIMULATION AND TREATMENT PLANNING NOTE    ICD-10-CM   1. Prostate cancer metastatic to bone [facility]) C61    C79.51     DIAGNOSIS:  53 y.o. 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  NARRATIVE:  The patient was brought to the [facility].  Identity was confirmed.  All relevant records and images related to the planned course of therapy were reviewed.  The patient freely provided informed written consent to proceed with treatment after reviewing the details related to the planned course of therapy. The consent form was witnessed and verified by the simulation staff.  Then, the patient was set-up in a stable reproducible  supine position for radiation therapy.  CT images were obtained.  Surface markings were placed.  The CT images were loaded into the planning software.  Then the target and avoidance structures were contoured including kidneys.  Treatment planning then occurred.  The radiation prescription was entered and confirmed.  Then, I designed and supervised the construction of a total of 3 medically necessary complex treatment devices with VacLoc positioner and 2 MLCs to shield kidneys.  I have requested : 3D Simulation  I have requested a DVH of the following structures: Left Kidney, Right Kidney and target.  PLAN:  The patient will receive 30 Gy in 10 fractions.  ________________________________  Sheral Apley Tammi Klippel, M.D.  This document serves as a record of services personally performed by Tyler Pita, MD. It was created on his behalf by Wilburn Mylar, a trained medical scribe. The creation of this record is based on the scribe's personal observations and the provider's statements to them. This document has been checked and approved by the attending  provider.

## 2019-01-01 ENCOUNTER — Ambulatory Visit: Payer: Self-pay | Attending: Family Medicine | Admitting: Family Medicine

## 2019-01-01 ENCOUNTER — Ambulatory Visit
Admission: RE | Admit: 2019-01-01 | Discharge: 2019-01-01 | Disposition: A | Discharge status: Home / Self Care | Payer: Medicaid Other | Source: Ambulatory Visit | Attending: Radiation Oncology | Admitting: Radiation Oncology

## 2019-01-01 ENCOUNTER — Encounter: Payer: Self-pay | Admitting: Family Medicine

## 2019-01-01 ENCOUNTER — Inpatient Hospital Stay: Payer: Medicaid Other

## 2019-01-01 ENCOUNTER — Telehealth: Payer: Self-pay | Admitting: *Deleted

## 2019-01-01 ENCOUNTER — Other Ambulatory Visit: Payer: Self-pay | Admitting: Urology

## 2019-01-01 VITALS — BP 114/76 | HR 76 | Temp 98.7°F | Resp 18 | Ht 73.0 in | Wt 225.0 lb

## 2019-01-01 DIAGNOSIS — J4489 Other specified chronic obstructive pulmonary disease: Secondary | ICD-10-CM

## 2019-01-01 DIAGNOSIS — C61 Malignant neoplasm of prostate: Secondary | ICD-10-CM

## 2019-01-01 DIAGNOSIS — M25511 Pain in right shoulder: Secondary | ICD-10-CM

## 2019-01-01 DIAGNOSIS — Z510 Encounter for antineoplastic radiation therapy: Secondary | ICD-10-CM | POA: Diagnosis not present

## 2019-01-01 DIAGNOSIS — C7951 Secondary malignant neoplasm of bone: Principal | ICD-10-CM

## 2019-01-01 DIAGNOSIS — K5903 Drug induced constipation: Secondary | ICD-10-CM

## 2019-01-01 DIAGNOSIS — M5416 Radiculopathy, lumbar region: Secondary | ICD-10-CM

## 2019-01-01 DIAGNOSIS — M549 Dorsalgia, unspecified: Secondary | ICD-10-CM

## 2019-01-01 DIAGNOSIS — J449 Chronic obstructive pulmonary disease, unspecified: Secondary | ICD-10-CM

## 2019-01-01 LAB — COMPREHENSIVE METABOLIC PANEL
ALT: 16 U/L (ref 0–44)
AST: 23 U/L (ref 15–41)
Albumin: 4.2 g/dL (ref 3.5–5.0)
Alkaline Phosphatase: 2910 U/L — ABNORMAL HIGH (ref 38–126)
Anion gap: 8 (ref 5–15)
BUN: 14 mg/dL (ref 6–20)
CO2: 27 mmol/L (ref 22–32)
Calcium: 9.4 mg/dL (ref 8.9–10.3)
Chloride: 103 mmol/L (ref 98–111)
Creatinine, Ser: 0.92 mg/dL (ref 0.61–1.24)
GFR calc Af Amer: >60 mL/min (ref >60)
GFR calc non Af Amer: >60 mL/min (ref >60)
Glucose, Bld: 104 mg/dL — ABNORMAL HIGH (ref 70–99)
Potassium: 3.4 mmol/L — ABNORMAL LOW (ref 3.5–5.1)
Sodium: 138 mmol/L (ref 135–145)
Total Bilirubin: 0.6 mg/dL (ref 0.3–1.2)
Total Protein: 7.8 g/dL (ref 6.5–8.1)

## 2019-01-01 LAB — CBC WITH DIFFERENTIAL (CANCER CENTER ONLY)
Abs Immature Granulocytes: 0.14 10*3/uL — ABNORMAL HIGH (ref 0.00–0.07)
Basophils Absolute: 0.1 10*3/uL (ref 0.0–0.1)
Basophils Relative: 1 %
Eosinophils Absolute: 0.1 10*3/uL (ref 0.0–0.5)
Eosinophils Relative: 2 %
HEMATOCRIT: 32.6 % — AB (ref 39.0–52.0)
Hemoglobin: 10.1 g/dL — ABNORMAL LOW (ref 13.0–17.0)
Immature Granulocytes: 2 %
Lymphocytes Relative: 42 %
Lymphs Abs: 3.2 10*3/uL (ref 0.7–4.0)
MCH: 29.4 pg (ref 26.0–34.0)
MCHC: 31 g/dL (ref 30.0–36.0)
MCV: 95 fL (ref 80.0–100.0)
MONOS PCT: 10 %
Monocytes Absolute: 0.7 10*3/uL (ref 0.1–1.0)
NEUTROS ABS: 3.2 10*3/uL (ref 1.7–7.7)
Neutrophils Relative %: 43 %
Platelet Count: 263 10*3/uL (ref 150–400)
RBC: 3.43 MIL/uL — ABNORMAL LOW (ref 4.22–5.81)
RDW: 20.9 % — ABNORMAL HIGH (ref 11.5–15.5)
WBC Count: 7.4 10*3/uL (ref 4.0–10.5)
nRBC: 1.9 % — ABNORMAL HIGH (ref 0.0–0.2)

## 2019-01-01 MED ORDER — DOXYCYCLINE HYCLATE 100 MG PO TABS: 100.0000 mg | ORAL_TABLET | Freq: Two times a day (BID) | ORAL | 0 refills | Status: DC

## 2019-01-01 MED ORDER — GABAPENTIN 300 MG PO CAPS: 300.0000 mg | ORAL_CAPSULE | Freq: Three times a day (TID) | ORAL | 6 refills | Status: DC

## 2019-01-01 MED ORDER — POLYETHYLENE GLYCOL 3350 17 GM/SCOOP PO POWD: 17.0000 g | Freq: Every day | ORAL | 11 refills | Status: AC

## 2019-01-01 MED ORDER — POLYETHYLENE GLYCOL 3350 17 GM/SCOOP PO POWD: 17.0000 g | Freq: Every day | ORAL | 11 refills | Status: DC

## 2019-01-01 MED ORDER — GABAPENTIN 300 MG PO CAPS: 300.0000 mg | ORAL_CAPSULE | Freq: Three times a day (TID) | ORAL | 6 refills | Status: AC

## 2019-01-01 MED FILL — DOXYCYCLINE HYCLATE 100 MG: 100 | 10 days supply | Qty: 20 | Fill #0

## 2019-01-01 MED FILL — SM CLEARLAX POWDER: 17 | 84 days supply | Qty: 1428 | Fill #0

## 2019-01-01 MED FILL — GABAPENTIN 300 MG CAPSULE: 300 | 30 days supply | Qty: 90 | Fill #0

## 2019-01-01 NOTE — Telephone Encounter (Signed)
Called patient to inform of palliative care appt. for 01-06-19 @ 9 am, spoke with patient and he is aware of this appt

## 2019-01-01 NOTE — Progress Notes (Signed)
Established Patient Office Visit  Subjective:  Patient ID: Roy Rosales, male    DOB: 1966-01-04  Age: 53 y.o. MRN: [id number]  CC: No chief complaint on file.   HPI Roy Rosales presents for ongoing medical follow-up.  Patient was last seen in the office on 11/20/2018 by another provider secondary to hospital follow-up after experiencing a seizure and patient also at that visit had complaints of urinary urgency and frequency, diffuse bone pain and history of elevated PSA in 2013.  Patient has now been diagnosed with prostate cancer which is metastatic to the bones.  Patient had oncology visit on 12/20/2018 and patient apparently had been told in 2014 that he had prostate cancer but per patient his insurance premium kept going up and he could not afford the surgery.  Patient states that when he was initially diagnosed with prostate cancer, his father was also undergoing treatment for prostate cancer with prostate seed therapy which patient wanted to have also but he was told that his insurance would only pay for surgery for removal of the prostate and patient states that by the time he decided to have the surgery, he no longer had insurance coverage.  Patient had CT scan of the abdomen and pelvis on November 28, 2018 which showed widespread metastatic disease to the bone with pathologic compression fractures at T8, T9 and T12.  Patient status post prostate biopsy on December 04, 2018 which showed prostate adenocarcinoma with a Gleason score of 9.  Patient has appointment later today at the cancer center for lab work and treatment.  Patient states that he will start chemotherapy this upcoming Saturday.      Patient states that he was previously taking oxycodone which was provided by oncology to help manage his back and right shoulder pain but a new prescription was sent in for him to start morphine and he took the first dose last night.  Patient states that he does not really feel that the morphine  has done anything to help decrease his level of pain.  He has had issues with constipation since being on pain medication and has taken over-the-counter Dulcolax and Ex-Lax to help with the constipation.  Patient continues to have sharp mid and low back pain as well as pain in his right shoulder.  Patient reports that he injured his right shoulder during a seizure but he is not sure how the shoulder was injured but patient states that he was told that his shoulder was dislocated and that he had a fracture in his upper arm near the shoulder.  Patient continues to have pain in his right shoulder and decreased ability to lift his right arm.  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 (ED records from September 2019 indicate that patient also with history of polysubstance abuse and alcohol abuse and question whether seizure may have been secondary to withdrawal).  Patient reports back pain that is always between an 8-10 on a 0-to-10 scale and patient has pain, burning and numbness that go down both legs.  Patient also feels as if his legs are weak when he attempts to stand up from a seated position.       Patient reports that he continues to smoke and he has had some shortness of breath and cough.  Patient states that cough is  mostly nonproductive but occasionally he has white to yellow sputum.  Patient denies any current fever or chills.  Patient does feel very fatigued.  Patient denies sore throat or postnasal drainage.  Patient does have occasional nasal congestion.   Past Medical History:  Diagnosis Date  . COPD (chronic obstructive pulmonary disease) ([place])   . Hypertension   . Polysubstance abuse ([place])   . Prostate cancer [facility])     Past Surgical History:  Procedure Laterality Date  . NO PAST SURGERIES    . PROSTATE BIOPSY      Family History  Problem  Relation Age of Onset  . Breast cancer Mother   . Hypertension Mother   . Cancer Sister   . Prostate cancer Father   . Prostate cancer Brother   . Stomach cancer Brother   . Seizures Neg Hx     Social History   Socioeconomic History  . Marital status: Legally Separated    Spouse name: Not on file  . Number of children: 1  . Years of education: Not on file  . Highest education level: GED or equivalent  Occupational History  . Occupation: concrete work    Comment: out of work since September 2019  Social Needs  . Financial resource strain: Not on file  . Food insecurity:    Worry: Not on file    Inability: Not on file  . Transportation needs:    Medical: Not on file    Non-medical: Not on file  Tobacco Use  . Smoking status: Current Every Day Smoker    Packs/day: 1.00    Types: Cigarettes  . Smokeless tobacco: Never Used  Substance and Sexual Activity  . Alcohol use: Yes    Comment: 6-12+ beer/day; update 09/12/18 none since 09/07/18, plans to quit, was drinking 5-6 beers per day  . Drug use: Yes    Types: Marijuana, PCP    Comment: PCP for about 3 months, none since 2017 or 2018  . Sexual activity: Not Currently  Lifestyle  . Physical activity:    Days per week: Not on file    Minutes per session: Not on file  . Stress: Not on file  Relationships  . Social connections:    Talks on phone: Not on file    Gets together: Not on file    Attends religious service: Not on file    Active member of club or organization: Not on file    Attends meetings of clubs or organizations: Not on file    Relationship status: Not on file  . Intimate partner violence:    Fear of current or ex partner: Not on file    Emotionally abused: Not on file    Physically abused: Not on file    Forced sexual activity: Not on file  Other Topics Concern  . Not on file  Social History Narrative   Lives at home with his mother    Right handed    Caffeine: 1 cup in the mornings     Outpatient Medications Prior to Visit  Medication Sig Dispense Refill  . albuterol (PROVENTIL HFA;VENTOLIN HFA) 108 (90 Base) MCG/ACT inhaler Inhale 2 puffs into the lungs every 4 (four) hours as needed. 1 Inhaler 2  . atenolol (TENORMIN) 100 MG tablet Take 1 tablet (100 mg total) by mouth daily. 30 tablet 6  . celecoxib (CELEBREX) 100 MG capsule Take 1 capsule (100 mg total) by mouth 2 (two) times daily. 60 capsule 3  . diazepam (  DIASTAT ACUDIAL) 10 MG GEL Place 10 mg rectally once as needed for up to 1 dose for seizure. 1 Package 0  . levETIRAcetam (KEPPRA) 500 MG tablet Take 1 tablet (500 mg total) by mouth 2 (two) times daily. 60 tablet 11  . morphine (MS CONTIN) 60 MG 12 hr tablet Take 1 tablet (60 mg total) by mouth every 12 (twelve) hours. 60 tablet 0  . oxyCODONE-acetaminophen (PERCOCET) 10-325 MG tablet Take 1 tablet by mouth every 4 (four) hours as needed for pain. 60 tablet 0  . prochlorperazine (COMPAZINE) 10 MG tablet Take 1 tablet (10 mg total) by mouth every 6 (six) hours as needed for nausea or vomiting. 30 tablet 0  . triamterene-hydrochlorothiazide (MAXZIDE) 75-50 MG tablet Take 1 tablet by mouth daily. 30 tablet 6  . valACYclovir (VALTREX) 500 MG tablet Take 500 mg by mouth daily.     No facility-administered medications prior to visit.     No Known Allergies  ROS Review of Systems  Constitutional: Positive for fatigue. Negative for chills and fever.  HENT: Positive for congestion and postnasal drip. Negative for ear pain, nosebleeds, rhinorrhea, sore throat and trouble swallowing.   Respiratory: Positive for cough and shortness of breath. Negative for chest tightness and wheezing.   Cardiovascular: Negative for chest pain, palpitations and leg swelling.  Gastrointestinal: Positive for constipation. Negative for abdominal pain, blood in stool, diarrhea and nausea.  Endocrine: Negative for polydipsia, polyphagia and polyuria.  Genitourinary: Positive for difficulty  urinating.  Musculoskeletal: Positive for arthralgias, back pain, gait problem and myalgias.  Neurological: Negative for dizziness and headaches.  Hematological: Negative for adenopathy. Does not bruise/bleed easily.      Objective:    Physical Exam BP 114/76 (BP Location: Left Arm, Patient Position: Sitting, Cuff Size: Normal)   Pulse 76   Temp 98.7 F (37.1 C) (Oral)   Resp 18   Ht 6\' 1"  (1.854 m)   Wt 225 lb (102.1 kg)   SpO2 99%   BMI 29.69 kg/m Nurse's notes and vital signs reviewed  Wt Readings from Last 3 Encounters:  12/31/18 226 lb (102.5 kg)  12/20/18 227 lb 1.6 oz (103 kg)  12/04/18 230 lb (104.3 kg)   General-well-nourished, well-developed male in no acute distress but patient is sitting in a somewhat abnormal position on exam chair. Neck-patient with large neck size, possible thyroid fullness, possible lymphadenopathy but difficult to assess Lungs-patient with coarse breath sounds in all lung fields and decreased air movement, no accessory muscle use, breathing is nonlabored Cardiovascular-regular rate and rhythm Abdomen- mild abdominal distention, soft, nontender Back-patient with tenderness over the thoracic and lumbar spine with thoracolumbar paraspinous spasm and positive seated leg raise bilaterally Musculoskeletal-patient with pain with palpation over the right posterior anterior shoulder and upper arm as well as decreased active range of motion Extremities-no edema   Health Maintenance Due  Topic Date Due  . TETANUS/TDAP  07/15/1985  . COLONOSCOPY  07/15/2016    There are no preventive care reminders to display for this patient.  Lab Results  Component Value Date   TSH 1.231 03/06/2017   Lab Results  Component Value Date   WBC 12.4 (H) 11/20/2018   HGB 10.8 (L) 11/20/2018   HCT 32.2 (L) 11/20/2018   MCV 89 11/20/2018   PLT 332 11/20/2018   Lab Results  Component Value Date   NA 134 11/20/2018   K 3.6 11/20/2018   CO2 23 11/20/2018    GLUCOSE 94 11/20/2018   BUN  11 11/20/2018   CREATININE 0.79 11/20/2018   BILITOT 0.5 11/20/2018   ALKPHOS 2,365 (HH) 11/21/2018   AST 77 (H) 11/20/2018   ALT 17 11/20/2018   PROT 7.6 11/20/2018   ALBUMIN 4.1 11/20/2018   CALCIUM 9.6 11/20/2018   ANIONGAP 21 (H) 09/07/2018   Lab Results  Component Value Date   CHOL 218 (H) 03/02/2010   Lab Results  Component Value Date   HDL 51 03/02/2010   Lab Results  Component Value Date   LDLCALC 137 (H) 03/02/2010   Lab Results  Component Value Date   TRIG 149 03/02/2010   Lab Results  Component Value Date   CHOLHDL 4.3 Ratio 03/02/2010   No results found for: HGBA1C    Assessment & Plan:   1. Prostate cancer metastatic to bone [facility]) Patient with metastatic prostate cancer which is currently being treated at the cancer center and patient has appointment this afternoon for blood work as well as additional testing and patient reports that he is to start chemotherapy this Saturday.  Patient's recent oncology notes reviewed as well as imaging.  2. COPD with chronic bronchitis ([place]) Patient with COPD with chronic bronchitis with abnormal lung sounds on examination.  Prescription for doxycycline for treatment of COPD related bronchitis - doxycycline (VIBRA-TABS) 100 MG tablet; Take 1 tablet (100 mg total) by mouth 2 (two) times daily. After eating to treat bronchitis  Dispense: 20 tablet; Refill: 0  3. Lumbar radiculopathy Patient with complaint of low back pain with radiation and patient with prostate cancer metastatic to the spine.  Prescription for Neurontin 300 mg to start with 1 pill at bedtime x1 week and if he tolerates this well he can increase to taking medication 3 times daily.  Patient is aware that the medication may cause dizziness and drowsiness.  Patient is to follow-up with his oncologist regarding his pain medications.  Patient reports that he was recently placed on morphine but does not feel that this medication is  helping/decreasing his pain. - gabapentin (NEURONTIN) 300 MG capsule; Take 1 capsule (300 mg total) by mouth 3 (three) times daily. Start with 1 pill at bedtime for the first week then increase if tolerated  Dispense: 90 capsule; Refill: 6  4. Drug-induced constipation Patient likely with opioid-induced constipation and prescription provided for MiraLAX to take once daily.  Patient may stop the medication for 2 to 3 days if he develops diarrhea then restart use of medication - polyethylene glycol powder (GLYCOLAX/MIRALAX) powder; Take 17 g by mouth daily. Mixed with at least 8 ounces of water to treat constipation  Dispense: 3350 g; Refill: 11  5. Back pain with radiation Patient with back pain with radiation/lumbar radiculopathy due to pathologic fractures from metastatic prostate cancer.  Prescription provided for gabapentin and patient is to ask his oncologist about pain medication as he does not feel that his current medication is controlling his pain  6. Acute pain of right shoulder Patient with acute pain of the right shoulder but on review of chart, patient may have had initial onset of right shoulder pain after having a seizure in September of last year.  Patient is currently undergoing treatment for metastatic breast cancer and is on pain medication.  Unsure if patient would benefit from orthopedic referral at this time but will readdress at patient's next visit in 6 weeks.  Patient is receiving pain medication from oncology.    Follow-up: Return in about 6 weeks (around 02/12/2019) for HTN/COPD-sooner if needed.  [name], MD

## 2019-01-02 ENCOUNTER — Other Ambulatory Visit: Payer: Self-pay

## 2019-01-02 ENCOUNTER — Other Ambulatory Visit: Payer: Self-pay | Admitting: Medical Oncology

## 2019-01-02 ENCOUNTER — Ambulatory Visit
Admission: RE | Admit: 2019-01-02 | Discharge: 2019-01-02 | Disposition: A | Discharge status: Home / Self Care | Payer: Medicaid Other | Source: Ambulatory Visit | Attending: Radiation Oncology | Admitting: Radiation Oncology

## 2019-01-02 ENCOUNTER — Encounter: Payer: Self-pay | Admitting: Medical Oncology

## 2019-01-02 ENCOUNTER — Other Ambulatory Visit: Payer: Self-pay | Admitting: Oncology

## 2019-01-02 ENCOUNTER — Inpatient Hospital Stay: Payer: Medicaid Other

## 2019-01-02 VITALS — BP 109/63 | HR 73 | Temp 98.7°F | Resp 20 | Wt 222.0 lb

## 2019-01-02 DIAGNOSIS — C61 Malignant neoplasm of prostate: Secondary | ICD-10-CM

## 2019-01-02 DIAGNOSIS — C7951 Secondary malignant neoplasm of bone: Principal | ICD-10-CM

## 2019-01-02 DIAGNOSIS — Z510 Encounter for antineoplastic radiation therapy: Secondary | ICD-10-CM | POA: Diagnosis not present

## 2019-01-02 LAB — PROSTATE-SPECIFIC AG, SERUM (LABCORP): PROSTATE SPECIFIC AG, SERUM: 9 ng/mL — AB (ref 0.0–4.0)

## 2019-01-02 MED ORDER — DEXAMETHASONE SODIUM PHOSPHATE 10 MG/ML IJ SOLN
INTRAMUSCULAR | Status: AC
  Filled 2019-01-02: qty 1

## 2019-01-02 MED ORDER — OXYCODONE-ACETAMINOPHEN 10-325 MG PO TABS: 1.0000 | ORAL_TABLET | ORAL | 0 refills | Status: DC | PRN

## 2019-01-02 MED ORDER — DEXAMETHASONE SODIUM PHOSPHATE 10 MG/ML IJ SOLN
10.0000 mg | Freq: Once | INTRAMUSCULAR | Status: AC
  Administered 2019-01-02: 10 mg via INTRAVENOUS

## 2019-01-02 MED ORDER — SODIUM CHLORIDE 0.9 % IV SOLN
Freq: Once | INTRAVENOUS | Status: AC
  Administered 2019-01-02: 09:00:00 via INTRAVENOUS
  Filled 2019-01-02: qty 250

## 2019-01-02 MED ORDER — SODIUM CHLORIDE 0.9 % IV SOLN
75.0000 mg/m2 | Freq: Once | INTRAVENOUS | Status: AC
  Administered 2019-01-02: 170 mg via INTRAVENOUS
  Filled 2019-01-02: qty 17

## 2019-01-02 MED ORDER — SODIUM CHLORIDE 0.9 % IV SOLN: 10.0000 mg | Freq: Once | INTRAVENOUS | Status: DC

## 2019-01-02 MED FILL — OXYCODONE-APAP 10-325: 10-325 | 10 days supply | Qty: 60 | Fill #0

## 2019-01-02 NOTE — Progress Notes (Signed)
Roy Rosales receiving his first Taxotere infusion without complications. He states he is doing well but overwhelmed with his appointments. I printed a calendar and reviewed his schedule for the remainder of the week. He has multiple radiology appointments tomorrow and I discussed the procedures and locations. He voiced understanding. He states that he started Morphine every 12 hours but it really does not seem to help so he took it after 10 hours. I discussed with him not to take sooner than 12 hours and to use Oxycodone every 4 hours in between doses. I explained as the long acting begins to work he will need less oxycodone.He has an appointment with palliative care on Monday to help with pain management.He asked for a refill on oxycodone and message relayed to Andrea/Dr. Alen Blew. I will continue to follow and asked him to call me with questions or concerns. He voiced understanding.

## 2019-01-02 NOTE — Patient Instructions (Signed)
[facility] Discharge Instructions for Patients Receiving Chemotherapy  Today you received the following chemotherapy agents Docetaxel (TAXOTERE).  To help prevent nausea and vomiting after your treatment, we encourage you to take your nausea medication as prescribed.   If you develop nausea and vomiting that is not controlled by your nausea medication, call the clinic.   BELOW ARE SYMPTOMS THAT SHOULD BE REPORTED IMMEDIATELY:  *FEVER GREATER THAN 100.5 F  *CHILLS WITH OR WITHOUT FEVER  NAUSEA AND VOMITING THAT IS NOT CONTROLLED WITH YOUR NAUSEA MEDICATION  *UNUSUAL SHORTNESS OF BREATH  *UNUSUAL BRUISING OR BLEEDING  TENDERNESS IN MOUTH AND THROAT WITH OR WITHOUT PRESENCE OF ULCERS  *URINARY PROBLEMS  *BOWEL PROBLEMS  UNUSUAL RASH Items with * indicate a potential emergency and should be followed up as soon as possible.  Feel free to call the clinic should you have any questions or concerns. The clinic phone number is [phone].  Please show the [facility] at check-in to the Emergency Department and triage nurse.  Docetaxel injection What is this medicine? DOCETAXEL (doe se TAX el) is a chemotherapy drug. It targets fast dividing cells, like cancer cells, and causes these cells to die. This medicine is used to treat many types of cancers like breast cancer, certain stomach cancers, head and neck cancer, lung cancer, and prostate cancer. This medicine may be used for other purposes; ask your health care provider or pharmacist if you have questions. COMMON BRAND NAME(S): Docefrez, Taxotere What should I tell my health care provider before I take this medicine? They need to know if you have any of these conditions: -infection (especially a virus infection such as chickenpox, cold sores, or herpes) -liver disease -low blood counts, like low white cell, platelet, or red cell counts -an unusual or allergic reaction to docetaxel, polysorbate 80, other  chemotherapy agents, other medicines, foods, dyes, or preservatives -pregnant or trying to get pregnant -breast-feeding How should I use this medicine? This drug is given as an infusion into a vein. It is administered in a hospital or clinic by a specially trained health care professional. Talk to your pediatrician regarding the use of this medicine in children. Special care may be needed. Overdosage: If you think you have taken too much of this medicine contact a poison control center or emergency room at once. NOTE: This medicine is only for you. Do not share this medicine with others. What if I miss a dose? It is important not to miss your dose. Call your doctor or health care professional if you are unable to keep an appointment. What may interact with this medicine? -cyclosporine -erythromycin -ketoconazole -medicines to increase blood counts like filgrastim, pegfilgrastim, sargramostim -vaccines Talk to your doctor or health care professional before taking any of these medicines: -acetaminophen -aspirin -ibuprofen -ketoprofen -naproxen This list may not describe all possible interactions. Give your health care provider a list of all the medicines, herbs, non-prescription drugs, or dietary supplements you use. Also tell them if you smoke, drink alcohol, or use illegal drugs. Some items may interact with your medicine. What should I watch for while using this medicine? Your condition will be monitored carefully while you are receiving this medicine. You will need important blood work done while you are taking this medicine. This drug may make you feel generally unwell. This is not uncommon, as chemotherapy can affect healthy cells as well as cancer cells. Report any side effects. Continue your course of treatment even though you feel ill  unless your doctor tells you to stop. In some cases, you may be given additional medicines to help with side effects. Follow all directions for their  use. Call your doctor or health care professional for advice if you get a fever, chills or sore throat, or other symptoms of a cold or flu. Do not treat yourself. This drug decreases your body's ability to fight infections. Try to avoid being around people who are sick. This medicine may increase your risk to bruise or bleed. Call your doctor or health care professional if you notice any unusual bleeding. This medicine may contain alcohol in the product. You may get drowsy or dizzy. Do not drive, use machinery, or do anything that needs mental alertness until you know how this medicine affects you. Do not stand or sit up quickly, especially if you are an older patient. This reduces the risk of dizzy or fainting spells. Avoid alcoholic drinks. Do not become pregnant while taking this medicine or for 6 months after stopping it. Women should inform their doctor if they wish to become pregnant or think they might be pregnant. Men should not father a child while taking this medicine and for 3 months after stopping it. There is a potential for serious side effects to an unborn child. Talk to your health care professional or pharmacist for more information. Do not breast-feed an infant while taking this medicine or for 2 weeks after stopping it. This may interfere with the ability to father a child. You should talk to your doctor or health care professional if you are concerned about your fertility. What side effects may I notice from receiving this medicine? Side effects that you should report to your doctor or health care professional as soon as possible: -allergic reactions like skin rash, itching or hives, swelling of the face, lips, or tongue -low blood counts - This drug may decrease the number of white blood cells, red blood cells and platelets. You may be at increased risk for infections and bleeding. -signs of infection - fever or chills, cough, sore throat, pain or difficulty passing urine -signs of  decreased platelets or bleeding - bruising, pinpoint red spots on the skin, black, tarry stools, nosebleeds -signs of decreased red blood cells - unusually weak or tired, fainting spells, lightheadedness -breathing problems -fast or irregular heartbeat -low blood pressure -mouth sores -nausea and vomiting -pain, swelling, redness or irritation at the injection site -pain, tingling, numbness in the hands or feet -swelling of the ankle, feet, hands -weight gain Side effects that usually do not require medical attention (report to your doctor or health care professional if they continue or are bothersome): -bone pain -complete hair loss including hair on your head, underarms, pubic hair, eyebrows, and eyelashes -diarrhea -excessive tearing -changes in the color of fingernails -loosening of the fingernails -nausea -muscle pain -red flush to skin -sweating -weak or tired This list may not describe all possible side effects. Call your doctor for medical advice about side effects. You may report side effects to FDA at 1-800-FDA-1088. Where should I keep my medicine? This drug is given in a hospital or clinic and will not be stored at home. NOTE: This sheet is a summary. It may not cover all possible information. If you have questions about this medicine, talk to your doctor, pharmacist, or health care provider.  2019 Elsevier/Gold Standard (2017-11-12 12:07:21)

## 2019-01-02 NOTE — Progress Notes (Signed)
Roy Rosales scheduled for MRI and bone scan tomorrow. Dr. Tammi Klippel requested MRI of thoracic spine be added to radiology studies. I called MRI to see if we can add thoracic spine tomorrow. Per [name] study added.

## 2019-01-03 ENCOUNTER — Encounter (HOSPITAL_COMMUNITY)
Admission: RE | Admit: 2019-01-03 | Discharge: 2019-01-03 | Disposition: A | Discharge status: Home / Self Care | Payer: Medicaid Other | Source: Ambulatory Visit | Attending: Oncology | Admitting: Oncology

## 2019-01-03 ENCOUNTER — Ambulatory Visit (HOSPITAL_COMMUNITY)
Admission: RE | Admit: 2019-01-03 | Discharge: 2019-01-03 | Disposition: A | Discharge status: Home / Self Care | Payer: Medicaid Other | Source: Ambulatory Visit | Attending: Oncology | Admitting: Oncology

## 2019-01-03 ENCOUNTER — Telehealth: Payer: Self-pay | Admitting: *Deleted

## 2019-01-03 ENCOUNTER — Other Ambulatory Visit: Payer: Self-pay | Admitting: *Deleted

## 2019-01-03 ENCOUNTER — Ambulatory Visit
Admission: RE | Admit: 2019-01-03 | Discharge: 2019-01-03 | Disposition: A | Discharge status: Home / Self Care | Payer: Medicaid Other | Source: Ambulatory Visit | Attending: Radiation Oncology | Admitting: Radiation Oncology

## 2019-01-03 ENCOUNTER — Encounter: Payer: Self-pay | Admitting: Medical Oncology

## 2019-01-03 DIAGNOSIS — C7951 Secondary malignant neoplasm of bone: Principal | ICD-10-CM

## 2019-01-03 DIAGNOSIS — C61 Malignant neoplasm of prostate: Secondary | ICD-10-CM

## 2019-01-03 DIAGNOSIS — Z510 Encounter for antineoplastic radiation therapy: Secondary | ICD-10-CM | POA: Diagnosis not present

## 2019-01-03 MED ORDER — TECHNETIUM TC 99M MEDRONATE IV KIT
20.0000 | PACK | Freq: Once | INTRAVENOUS | Status: AC | PRN
  Administered 2019-01-03: 19.4 via INTRAVENOUS

## 2019-01-03 MED ORDER — GADOBUTROL 1 MMOL/ML IV SOLN
10.0000 mL | Freq: Once | INTRAVENOUS | Status: AC | PRN
  Administered 2019-01-03: 10 mL via INTRAVENOUS

## 2019-01-03 NOTE — Telephone Encounter (Signed)
Called Frederico Hamman for chemotherapy F/U.  Patient is doing well.  Denies n/v.  Or any new side effects or symptoms.  Bladder functioning well.  Reports obtaining Gwendolyn Lima powder today for bowels.  Eating and drinking well.  Instructed to drink 64 oz minimum daily or at least the day before, of and after treatment.   Denies questions or needs at this time.  Aware of tomorrow's injection appointment.  Encouraged to call [phone] Mon -Fri 8:00 am - 4:30 pm or anytime as needed for symptoms, changes.

## 2019-01-03 NOTE — Telephone Encounter (Signed)
-----   Message from Georgianne Fick, RN sent at 01/02/2019 12:05 PM EST ----- Regarding: Dr. Alen Blew first time Taxotere Patient received first time Docetaxel (TAXOTERE) today and tolerated this well.

## 2019-01-03 NOTE — Progress Notes (Signed)
Mr. Pavek completed day #3 of radiation treatments and is post cycle #1 chemo 3/5. He states he has tolerated his treatments with out any side effects. He states his pain has not improved and we discussed how it is too soon but as he continues treatment he should see improvement. I did inform him Dr. Alen Blew refilled his oxycodone and he can pick it up at the Center. I again reviewed how to take his pain medication and he voiced understanding. He is scheduled for bone scan and MRI of lumbar and thoracic spine today. He was unsure of radiology's location so I walked him to registration. He is aware of his injection appointment Saturday 11:45am. I asked him to call me with questions or concerns. He voiced understanding.

## 2019-01-04 ENCOUNTER — Inpatient Hospital Stay: Payer: Medicaid Other

## 2019-01-04 VITALS — BP 137/84 | HR 85 | Temp 98.3°F | Resp 16

## 2019-01-04 DIAGNOSIS — C7951 Secondary malignant neoplasm of bone: Principal | ICD-10-CM

## 2019-01-04 DIAGNOSIS — Z510 Encounter for antineoplastic radiation therapy: Secondary | ICD-10-CM | POA: Diagnosis not present

## 2019-01-04 DIAGNOSIS — C61 Malignant neoplasm of prostate: Secondary | ICD-10-CM

## 2019-01-04 MED ORDER — PEGFILGRASTIM INJECTION 6 MG/0.6ML ~~LOC~~
PREFILLED_SYRINGE | SUBCUTANEOUS | Status: AC
  Filled 2019-01-04: qty 0.6

## 2019-01-04 MED ORDER — PEGFILGRASTIM INJECTION 6 MG/0.6ML ~~LOC~~
6.0000 mg | PREFILLED_SYRINGE | Freq: Once | SUBCUTANEOUS | Status: AC
  Administered 2019-01-04: 6 mg via SUBCUTANEOUS

## 2019-01-04 NOTE — Patient Instructions (Signed)
Pegfilgrastim injection  What is this medicine?  PEGFILGRASTIM (PEG fil gra stim) is a long-acting granulocyte colony-stimulating factor that stimulates the growth of neutrophils, a type of white blood cell important in the body's fight against infection. It is used to reduce the incidence of fever and infection in patients with certain types of cancer who are receiving chemotherapy that affects the bone marrow, and to increase survival after being exposed to high doses of radiation.  This medicine may be used for other purposes; ask your health care provider or pharmacist if you have questions.  COMMON BRAND NAME(S): Fulphila, Neulasta, UDENYCA  What should I tell my health care provider before I take this medicine?  They need to know if you have any of these conditions:  -kidney disease  -latex allergy  -ongoing radiation therapy  -sickle cell disease  -skin reactions to acrylic adhesives (On-Body Injector only)  -an unusual or allergic reaction to pegfilgrastim, filgrastim, other medicines, foods, dyes, or preservatives  -pregnant or trying to get pregnant  -breast-feeding  How should I use this medicine?  This medicine is for injection under the skin. If you get this medicine at home, you will be taught how to prepare and give the pre-filled syringe or how to use the On-body Injector. Refer to the patient Instructions for Use for detailed instructions. Use exactly as directed. Tell your healthcare provider immediately if you suspect that the On-body Injector may not have performed as intended or if you suspect the use of the On-body Injector resulted in a missed or partial dose.  It is important that you put your used needles and syringes in a special sharps container. Do not put them in a trash can. If you do not have a sharps container, call your pharmacist or healthcare provider to get one.  Talk to your pediatrician regarding the use of this medicine in children. While this drug may be prescribed for  selected conditions, precautions do apply.  Overdosage: If you think you have taken too much of this medicine contact a poison control center or emergency room at once.  NOTE: This medicine is only for you. Do not share this medicine with others.  What if I miss a dose?  It is important not to miss your dose. Call your doctor or health care professional if you miss your dose. If you miss a dose due to an On-body Injector failure or leakage, a new dose should be administered as soon as possible using a single prefilled syringe for manual use.  What may interact with this medicine?  Interactions have not been studied.  Give your health care provider a list of all the medicines, herbs, non-prescription drugs, or dietary supplements you use. Also tell them if you smoke, drink alcohol, or use illegal drugs. Some items may interact with your medicine.  This list may not describe all possible interactions. Give your health care provider a list of all the medicines, herbs, non-prescription drugs, or dietary supplements you use. Also tell them if you smoke, drink alcohol, or use illegal drugs. Some items may interact with your medicine.  What should I watch for while using this medicine?  You may need blood work done while you are taking this medicine.  If you are going to need a MRI, CT scan, or other procedure, tell your doctor that you are using this medicine (On-Body Injector only).  What side effects may I notice from receiving this medicine?  Side effects that you should report to   your doctor or health care professional as soon as possible:  -allergic reactions like skin rash, itching or hives, swelling of the face, lips, or tongue  -back pain  -dizziness  -fever  -pain, redness, or irritation at site where injected  -pinpoint red spots on the skin  -red or dark-brown urine  -shortness of breath or breathing problems  -stomach or side pain, or pain at the shoulder  -swelling  -tiredness  -trouble passing urine or  change in the amount of urine  Side effects that usually do not require medical attention (report to your doctor or health care professional if they continue or are bothersome):  -bone pain  -muscle pain  This list may not describe all possible side effects. Call your doctor for medical advice about side effects. You may report side effects to FDA at 1-800-FDA-1088.  Where should I keep my medicine?  Keep out of the reach of children.  If you are using this medicine at home, you will be instructed on how to store it. Throw away any unused medicine after the expiration date on the label.  NOTE: This sheet is a summary. It may not cover all possible information. If you have questions about this medicine, talk to your doctor, pharmacist, or health care provider.   2019 Elsevier/Gold Standard (2018-01-21 16:57:08)

## 2019-01-06 ENCOUNTER — Ambulatory Visit
Admission: RE | Admit: 2019-01-06 | Discharge: 2019-01-06 | Disposition: A | Discharge status: Home / Self Care | Payer: Self-pay | Source: Ambulatory Visit | Attending: Oncology | Admitting: Oncology

## 2019-01-06 ENCOUNTER — Ambulatory Visit
Admission: RE | Admit: 2019-01-06 | Discharge: 2019-01-06 | Disposition: A | Discharge status: Home / Self Care | Payer: Medicaid Other | Source: Ambulatory Visit | Attending: Radiation Oncology | Admitting: Radiation Oncology

## 2019-01-06 DIAGNOSIS — C799 Secondary malignant neoplasm of unspecified site: Secondary | ICD-10-CM

## 2019-01-06 DIAGNOSIS — Z515 Encounter for palliative care: Secondary | ICD-10-CM

## 2019-01-06 DIAGNOSIS — Z510 Encounter for antineoplastic radiation therapy: Secondary | ICD-10-CM | POA: Diagnosis not present

## 2019-01-06 DIAGNOSIS — Z66 Do not resuscitate: Secondary | ICD-10-CM

## 2019-01-06 DIAGNOSIS — G893 Neoplasm related pain (acute) (chronic): Secondary | ICD-10-CM

## 2019-01-06 NOTE — Consult Note (Signed)
Consultation Note Date: 01/06/2019   Patient Name: Roy Rosales  DOB: [birth year]  MRN: [id number]  Age / Sex: 53 y.o., male  PCP: [facility] [place] Referring Physician: Wyatt Portela, MD  Reason for Consultation: Pain control and Psychosocial/spiritual support  HPI/Patient Profile:  HISTORY OF PRESENT ILLNESS: Roy Rosales is a 53 y.o. male with a recent diagnosis of metastatic prostate cancer. Per patient he was initially diagnosed with low grade, Gleason 3+3 adenocarcinoma of the prostate in 2014 and was scheduled to undergo prostatectomy with Dr. Louis Meckel but changed his mind and decided that he would prefer to have brachytherapy.  Treatment was complicated by insurance coverage and follow-up.  Per Dr. Cherrie Gauze recent note from 12/04/18, the patient has extensive history of psychiatric and substance abuse as well as socioeconomic factors contributing to his history of noncompliance and lack of follow-up.   He recently presented to the ED for new onset seizure disorder in 06/2018 and most recently on 09/07/2018 and was incidentally found to have elevated alkaline phosphatase over 400 on labs performed at time of admission.   A CT head was performed at each of those ER visits and were negative for any acute findings.  He was referred for follow up through the [facility] with Dr. Asencion Noble on 09/12/2018 and underwent repeat labs on 11/21/2018, with results showing PSA elevation at 335 and ALP further elevated at 2365.    He had a CT  on 11/28/18 which revealed widespread metastatic disease to the bone with pathologic compression fractures at T8, T9 and T12 as well as enlarged porta hepatis, pelvic lymphadenopathy, small right adrenal nodule,and an indeterminate low-density structure along the dome of liver measuring 1.6 cm.  Accordingly, he was referred for evaluation in  urology by Dr. Hollice Espy on 12/04/2018 and proceeded to transrectal ultrasound with 12 biopsies of the prostate that same day.  The prostate volume measured 81 cc.  Out of 6 core biopsies, all 6 were positive.  The maximum Gleason score was 5+4, and this was seen in left base, left mid, right base, right mid, and right apex. Additionally, Gleason 4+5 was noted in left apex. The patient received his intial Firmagon 240 mg injection on 12/04/18.  He met with Dr. Alen Blew in consult on 12/20/2018 who recommended further disease staging with Bone scan and further evaluation of his back pain/ compression fractures with MRI lumbar spine which is scheduled for 01/03/2019. He is scheduled to start his first cycle of chemotherapy with Taxotere on 01/02/19.   Currently receiving  radiation and ongoing systemic therapies.  He has ongoing chronic/acute pain which has been difficult to control.  He faces ongoing treatment option decisions, advanced directive decisions and anticipatory care needs.  Clinical Assessment and Goals of Care:                                     This nurse practitioner Wadie Lessen  reviewed medical records received report from team assessed the patient and then met with the patient in the outpatient radiation oncology clinic to introduce the role of palliative medicine into a holistic treatment plan, offer emotional support, and explore the patient's goals of care.  We discussed pain management strategies and limited pain management providers outside of his oncologist support.  For now he feels like his pain is controlled and the hope is current treatments will begin augment pain control.  He will continue on current medications prescribed by both Dr Alen Blew and Ailene Ards PA-C with Rad-Onc and re-evaluate in one week.  We discussed referral to pain clinic/Dr Harkins as a possibility.   His PCP is through the [facility]      Patient has no documented HPOA or AD.  We discussed  the imporatnce of and offered assistance with Social work     SUMMARY OF RECOMMENDATIONS    Code Status/Advance Care Planning:  Full code- would benefit from ongoing discussion   Symptom Management:  It will be important for this pateint to establish one provider to manage his complicated pain needs.  Currently he is prescribed:   MS Contin 60 mg po every 12 hours   Percocet 10-325 one tablet every 4 hrs prn Patient plans to return next week for a f/u viist at 2:00  Consider referral to OP pain clinic   Psycho-social/Spiritual:   Patient's mother with whom he lives with is his main support person  Patient tells me his faith/prayer is important to him   Created space and opportunity for Roy Rosales to explore his thoughts and feeling regarding his current life situation.  He is supported by Gwinda Maine LCSW at [facility]     Primary Diagnoses: Present on Admission: **None**   I have reviewed the medical record, interviewed the patient and family, and examined the patient. The following aspects are pertinent.  Past Medical History:  Diagnosis Date  . COPD (chronic obstructive pulmonary disease) ([place])   . Hypertension   . Polysubstance abuse ([place])   . Prostate cancer [facility])    Social History   Socioeconomic History  . Marital status: Legally Separated    Spouse name: Not on file  . Number of children: 1  . Years of education: Not on file  . Highest education level: GED or equivalent  Occupational History  . Occupation: concrete work    Comment: out of work since September 2019  Social Needs  . Financial resource strain: Not on file  . Food insecurity:    Worry: Not on file    Inability: Not on file  . Transportation needs:    Medical: Not on file    Non-medical: Not on file  Tobacco Use  . Smoking status: Current Every Day Smoker    Packs/day: 1.00    Types: Cigarettes  . Smokeless tobacco: Never Used  Substance and Sexual Activity  . Alcohol use: Yes     Comment: 6-12+ beer/day; update 09/12/18 none since 09/07/18, plans to quit, was drinking 5-6 beers per day  . Drug use: Yes    Types: Marijuana, PCP    Comment: PCP for about 3 months, none since 2017 or 2018  . Sexual activity: Not Currently  Lifestyle  . Physical activity:    Days per week: Not on file    Minutes per session: Not on file  . Stress: Not on file  Relationships  . Social connections:    Talks on phone: Not on file  Gets together: Not on file    Attends religious service: Not on file    Active member of club or organization: Not on file    Attends meetings of clubs or organizations: Not on file    Relationship status: Not on file  Other Topics Concern  . Not on file  Social History Narrative   Lives at home with his mother    Right handed    Caffeine: 1 cup in the mornings   Family History  Problem Relation Age of Onset  . Breast cancer Mother   . Hypertension Mother   . Cancer Sister   . Prostate cancer Father   . Prostate cancer Brother   . Stomach cancer Brother   . Seizures Neg Hx    Scheduled Meds: Continuous Infusions: PRN Meds:. Medications Prior to Admission:  Prior to Admission medications   Medication Sig Start Date End Date Taking? Authorizing Provider  albuterol (PROVENTIL HFA;VENTOLIN HFA) 108 (90 Base) MCG/ACT inhaler Inhale 2 puffs into the lungs every 4 (four) hours as needed. 11/20/18   Elsie Stain, MD  atenolol (TENORMIN) 100 MG tablet Take 1 tablet (100 mg total) by mouth daily. 11/20/18   Elsie Stain, MD  celecoxib (CELEBREX) 100 MG capsule Take 1 capsule (100 mg total) by mouth 2 (two) times daily. 11/20/18   Elsie Stain, MD  diazepam (DIASTAT ACUDIAL) 10 MG GEL Place 10 mg rectally once as needed for up to 1 dose for seizure. 09/07/18   Orpah Greek, MD  doxycycline (VIBRA-TABS) 100 MG tablet Take 1 tablet (100 mg total) by mouth 2 (two) times daily. After eating to treat bronchitis 01/01/19   Fulp, Cammie, MD    gabapentin (NEURONTIN) 300 MG capsule Take 1 capsule (300 mg total) by mouth 3 (three) times daily. Start with 1 pill at bedtime for the first week then increase if tolerated 01/01/19   Fulp, Cammie, MD  levETIRAcetam (KEPPRA) 500 MG tablet Take 1 tablet (500 mg total) by mouth 2 (two) times daily. 11/20/18   Elsie Stain, MD  morphine (MS CONTIN) 60 MG 12 hr tablet Take 1 tablet (60 mg total) by mouth every 12 (twelve) hours. 12/31/18   Bruning, Ashlyn, PA-C  oxyCODONE-acetaminophen (PERCOCET) 10-325 MG tablet Take 1 tablet by mouth every 4 (four) hours as needed for pain. 01/02/19   Wyatt Portela, MD  polyethylene glycol powder (GLYCOLAX/MIRALAX) powder Take 17 g by mouth daily. Mixed with at least 8 ounces of water to treat constipation 01/01/19   Fulp, Cammie, MD  prochlorperazine (COMPAZINE) 10 MG tablet Take 1 tablet (10 mg total) by mouth every 6 (six) hours as needed for nausea or vomiting. 12/20/18   Wyatt Portela, MD  triamterene-hydrochlorothiazide (MAXZIDE) 75-50 MG tablet Take 1 tablet by mouth daily. 11/20/18   Elsie Stain, MD  valACYclovir (VALTREX) 500 MG tablet Take 500 mg by mouth daily. 09/19/18   [provider]   No Known Allergies Review of Systems  Constitutional: Positive for fever.  Musculoskeletal: Positive for back pain.    Physical Exam Constitutional:      Appearance: He is normal weight.  Skin:    General: Skin is warm and dry.  Neurological:     Mental Status: He is alert.  Psychiatric:        Attention and Perception: Attention normal.        Mood and Affect: Mood normal.        Behavior: Behavior normal.  Cognition and Memory: Cognition normal.        Judgment: Judgment normal.     Vital Signs: There were no vitals taken for this visit.         SpO2:   O2 Device:  O2 Flow Rate: .   IO: Intake/output summary: No intake or output data in the 24 hours ending 01/06/19 0953  LBM:   Baseline Weight:   Most recent weight:        Palliative Assessment/Data:   Discussed with Gwinda Maine LCSW  Time In: 0900 Time Out: 1010 Time Total: 70 minutes Greater than 50%  of this time was spent counseling and coordinating care related to the above assessment and plan.  Signed by: Wadie Lessen, NP   Please contact Palliative Medicine Team phone at [phone] for questions and concerns.  For individual provider: See Shea Evans

## 2019-01-07 ENCOUNTER — Ambulatory Visit
Admission: RE | Admit: 2019-01-07 | Discharge: 2019-01-07 | Disposition: A | Discharge status: Home / Self Care | Payer: Medicaid Other | Source: Ambulatory Visit | Attending: Radiation Oncology | Admitting: Radiation Oncology

## 2019-01-07 DIAGNOSIS — Z66 Do not resuscitate: Secondary | ICD-10-CM | POA: Insufficient documentation

## 2019-01-07 DIAGNOSIS — Z510 Encounter for antineoplastic radiation therapy: Secondary | ICD-10-CM | POA: Diagnosis not present

## 2019-01-07 DIAGNOSIS — G893 Neoplasm related pain (acute) (chronic): Secondary | ICD-10-CM | POA: Insufficient documentation

## 2019-01-07 DIAGNOSIS — Z515 Encounter for palliative care: Secondary | ICD-10-CM | POA: Insufficient documentation

## 2019-01-07 DIAGNOSIS — C799 Secondary malignant neoplasm of unspecified site: Secondary | ICD-10-CM | POA: Insufficient documentation

## 2019-01-08 ENCOUNTER — Inpatient Hospital Stay: Payer: Medicaid Other | Admitting: *Deleted

## 2019-01-08 ENCOUNTER — Ambulatory Visit
Admission: RE | Admit: 2019-01-08 | Discharge: 2019-01-08 | Disposition: A | Discharge status: Home / Self Care | Payer: Medicaid Other | Source: Ambulatory Visit | Attending: Radiation Oncology | Admitting: Radiation Oncology

## 2019-01-08 ENCOUNTER — Other Ambulatory Visit: Payer: Self-pay

## 2019-01-08 DIAGNOSIS — Z510 Encounter for antineoplastic radiation therapy: Secondary | ICD-10-CM | POA: Diagnosis not present

## 2019-01-09 ENCOUNTER — Ambulatory Visit
Admission: RE | Admit: 2019-01-09 | Discharge: 2019-01-09 | Disposition: A | Discharge status: Home / Self Care | Payer: Medicaid Other | Source: Ambulatory Visit | Attending: Radiation Oncology | Admitting: Radiation Oncology

## 2019-01-09 ENCOUNTER — Other Ambulatory Visit: Payer: Self-pay

## 2019-01-09 DIAGNOSIS — Z510 Encounter for antineoplastic radiation therapy: Secondary | ICD-10-CM | POA: Diagnosis not present

## 2019-01-10 ENCOUNTER — Inpatient Hospital Stay: Payer: Medicaid Other | Admitting: *Deleted

## 2019-01-10 ENCOUNTER — Ambulatory Visit
Admission: RE | Admit: 2019-01-10 | Discharge: 2019-01-10 | Disposition: A | Discharge status: Home / Self Care | Payer: Medicaid Other | Source: Ambulatory Visit | Attending: Radiation Oncology | Admitting: Radiation Oncology

## 2019-01-10 ENCOUNTER — Other Ambulatory Visit: Payer: Self-pay

## 2019-01-10 DIAGNOSIS — C61 Malignant neoplasm of prostate: Secondary | ICD-10-CM

## 2019-01-10 DIAGNOSIS — C7951 Secondary malignant neoplasm of bone: Principal | ICD-10-CM

## 2019-01-10 DIAGNOSIS — Z510 Encounter for antineoplastic radiation therapy: Secondary | ICD-10-CM | POA: Diagnosis not present

## 2019-01-10 NOTE — Progress Notes (Signed)
[place] Work  Clinical Social Work met with patient in [place] office for follow up counseling session.  [name] reported he has started radiation and chemotherapy and has no concerns.  Patient is residing with his mother- reported his mother and sister are main supports.  He shared he relies heavily on his faith, and often shares "the Word" when going through difficult times.  [name] reported his main goal is raising his 53 year old son.  CSW discussed importance of support and coping strategies when facing mortality and managing fear/anxiety.   Patient agreed to follow up with CSW for support. Patient scheduled with [facility] for disability assistance on March 16 prior to radiation oncology appointment.   Roy Maine, Roy Rosales  Clinical Social Worker [facility]

## 2019-01-13 ENCOUNTER — Telehealth: Payer: Self-pay | Admitting: *Deleted

## 2019-01-13 ENCOUNTER — Ambulatory Visit
Admission: RE | Admit: 2019-01-13 | Discharge: 2019-01-13 | Disposition: A | Discharge status: Home / Self Care | Payer: Medicaid Other | Source: Ambulatory Visit | Attending: Radiation Oncology | Admitting: Radiation Oncology

## 2019-01-13 ENCOUNTER — Other Ambulatory Visit: Payer: Self-pay

## 2019-01-13 DIAGNOSIS — Z510 Encounter for antineoplastic radiation therapy: Secondary | ICD-10-CM | POA: Diagnosis not present

## 2019-01-13 MED ORDER — PROCHLORPERAZINE MALEATE 10 MG PO TABS: 10.0000 mg | ORAL_TABLET | Freq: Four times a day (QID) | ORAL | 0 refills | Status: DC | PRN

## 2019-01-13 MED FILL — PROCHLORPERAZINE 10 MG TAB: 10 | 7 days supply | Qty: 30 | Fill #0

## 2019-01-13 NOTE — Telephone Encounter (Signed)
Mr Kromer came by after RT. Needs a refill of his Oxycodone. Has 5 tablets left.   Compazine refilled by RN

## 2019-01-14 ENCOUNTER — Ambulatory Visit
Admission: RE | Admit: 2019-01-14 | Discharge: 2019-01-14 | Disposition: A | Discharge status: Home / Self Care | Payer: Medicaid Other | Source: Ambulatory Visit | Attending: Radiation Oncology | Admitting: Radiation Oncology

## 2019-01-14 ENCOUNTER — Other Ambulatory Visit: Payer: Self-pay | Admitting: Oncology

## 2019-01-14 ENCOUNTER — Other Ambulatory Visit: Payer: Self-pay

## 2019-01-14 DIAGNOSIS — Z510 Encounter for antineoplastic radiation therapy: Secondary | ICD-10-CM | POA: Diagnosis not present

## 2019-01-14 MED ORDER — OXYCODONE-ACETAMINOPHEN 10-325 MG PO TABS: 1.0000 | ORAL_TABLET | ORAL | 0 refills | Status: DC | PRN

## 2019-01-14 NOTE — Telephone Encounter (Signed)
Done

## 2019-01-15 ENCOUNTER — Ambulatory Visit
Admission: RE | Admit: 2019-01-15 | Discharge: 2019-01-15 | Disposition: A | Discharge status: Home / Self Care | Payer: Self-pay | Source: Ambulatory Visit | Attending: Oncology | Admitting: Oncology

## 2019-01-15 ENCOUNTER — Encounter: Payer: Self-pay | Admitting: *Deleted

## 2019-01-15 DIAGNOSIS — C7951 Secondary malignant neoplasm of bone: Principal | ICD-10-CM

## 2019-01-15 DIAGNOSIS — C61 Malignant neoplasm of prostate: Secondary | ICD-10-CM

## 2019-01-15 HISTORY — PX: IR RADIOLOGIST EVAL & MGMT: IMG5224

## 2019-01-16 NOTE — Consult Note (Signed)
Chief Complaint: Patient was seen in consultation today for  Chief Complaint  Patient presents with   Consult    Consult for Kyphoplasty    at the request of Shadad,Firas N  Referring Physician(s): Shadad,Firas N  History of Present Illness: Roy Rosales is a 53 y.o. male with recent diagnosis of metastatic prostate carcinoma.  He was diagnosed with low-grade adenocarcinoma in 2014,, deferred prostatectomy.  History of psychiatric and substance abuse contributing to noncompliance and lack of follow-up.  Presented to the ED in September 2019 with seizure disorder, CT head negative. 11/28/2018 CT demonstrates widespread osseous metastatic disease with compression deformities T8-T9 T12, abdominal and pelvic adenopathy, liver lesion. 12/04/2018 transrectal ultrasound biopsy, positive for prostate carcinoma.  01/03/2019 bone scintigraphy and MR thoracic and lumbar spine demonstrates extensive osseous metastatic disease.    He underwent radiation treatments to his thoracic spine.  He has started chemotherapy.Patient currently feels like his pain is well controlled. Past Medical History:  Diagnosis Date   COPD (chronic obstructive pulmonary disease) ([place])    Hypertension    Polysubstance abuse ([place])    Prostate cancer [facility])     Past Surgical History:  Procedure Laterality Date   IR RADIOLOGIST EVAL & MGMT  01/15/2019   NO PAST SURGERIES     PROSTATE BIOPSY      Allergies: Patient has no known allergies.  Medications: Prior to Admission medications   Medication Sig Start Date End Date Taking? Authorizing Provider  albuterol (PROVENTIL HFA;VENTOLIN HFA) 108 (90 Base) MCG/ACT inhaler Inhale 2 puffs into the lungs every 4 (four) hours as needed. 11/20/18   Elsie Stain, MD  atenolol (TENORMIN) 100 MG tablet Take 1 tablet (100 mg total) by mouth daily. 11/20/18   Elsie Stain, MD  celecoxib (CELEBREX) 100 MG capsule Take 1 capsule (100 mg total) by mouth 2  (two) times daily. 11/20/18   Elsie Stain, MD  diazepam (DIASTAT ACUDIAL) 10 MG GEL Place 10 mg rectally once as needed for up to 1 dose for seizure. 09/07/18   Orpah Greek, MD  doxycycline (VIBRA-TABS) 100 MG tablet Take 1 tablet (100 mg total) by mouth 2 (two) times daily. After eating to treat bronchitis 01/01/19   Fulp, Cammie, MD  gabapentin (NEURONTIN) 300 MG capsule Take 1 capsule (300 mg total) by mouth 3 (three) times daily. Start with 1 pill at bedtime for the first week then increase if tolerated 01/01/19   Fulp, Cammie, MD  levETIRAcetam (KEPPRA) 500 MG tablet Take 1 tablet (500 mg total) by mouth 2 (two) times daily. 11/20/18   Elsie Stain, MD  morphine (MS CONTIN) 60 MG 12 hr tablet Take 1 tablet (60 mg total) by mouth every 12 (twelve) hours. 12/31/18   Bruning, Ashlyn, PA-C  oxyCODONE-acetaminophen (PERCOCET) 10-325 MG tablet Take 1 tablet by mouth every 4 (four) hours as needed for pain. 01/14/19   Wyatt Portela, MD  polyethylene glycol powder (GLYCOLAX/MIRALAX) powder Take 17 g by mouth daily. Mixed with at least 8 ounces of water to treat constipation 01/01/19   Fulp, Cammie, MD  prochlorperazine (COMPAZINE) 10 MG tablet Take 1 tablet (10 mg total) by mouth every 6 (six) hours as needed for nausea or vomiting. 01/13/19   Wyatt Portela, MD  triamterene-hydrochlorothiazide (MAXZIDE) 75-50 MG tablet Take 1 tablet by mouth daily. 11/20/18   Elsie Stain, MD  valACYclovir (VALTREX) 500 MG tablet Take 500 mg by mouth daily. 09/19/18  [provider]     Family History  Problem Relation Age of Onset   Breast cancer Mother    Hypertension Mother    Cancer Sister    Prostate cancer Father    Prostate cancer Brother    Stomach cancer Brother    Seizures Neg Hx     Social History   Socioeconomic History   Marital status: Legally Separated    Spouse name: Not on file   Number of children: 1   Years of education: Not on file   Highest  education level: GED or equivalent  Occupational History   Occupation: concrete work    Comment: out of work since September 2019  Social Needs   Emergency planning/management officer strain: Not on file   Food insecurity:    Worry: Not on file    Inability: Not on file   Transportation needs:    Medical: Not on file    Non-medical: Not on file  Tobacco Use   Smoking status: Current Every Day Smoker    Packs/day: 1.00    Types: Cigarettes   Smokeless tobacco: Never Used  Substance and Sexual Activity   Alcohol use: Yes    Comment: 6-12+ beer/day; update 09/12/18 none since 09/07/18, plans to quit, was drinking 5-6 beers per day   Drug use: Yes    Types: Marijuana, PCP    Comment: PCP for about 3 months, none since 2017 or 2018   Sexual activity: Not Currently  Lifestyle   Physical activity:    Days per week: Not on file    Minutes per session: Not on file   Stress: Not on file  Relationships   Social connections:    Talks on phone: Not on file    Gets together: Not on file    Attends religious service: Not on file    Active member of club or organization: Not on file    Attends meetings of clubs or organizations: Not on file    Relationship status: Not on file  Other Topics Concern   Not on file  Social History Narrative   Lives at home with his mother    Right handed    Caffeine: 1 cup in the mornings    ECOG Status: 1 - Symptomatic but completely ambulatory     Physical exam: Constitutional: Oriented to person, place, and time. Well-developed and well-nourished. No distress.  Last Weight  Most recent update: 01/15/2019  1:51 PM   Weight  100.7 kg (222 lb)           HENT:  Head: Normocephalic and atraumatic.  Eyes: Conjunctivae and EOM are normal. Right eye exhibits no discharge. Left eye exhibits no discharge. No scleral icterus.  Neck: No JVD present.  Pulmonary/Chest: Effort normal. No stridor. No respiratory distress.  Abdomen: soft, non  distended Neurological:  alert and oriented to person, place, and time.  Skin: Skin is warm and dry.  not diaphoretic.  Psychiatric:   normal mood and affect.   behavior is normal. Judgment and thought content normal.    Review of Systems: A 12 point ROS discussed and pertinent positives are indicated in the HPI above.  All other systems are negative.    Mallampati Score:     Imaging: Mr Thoracic Spine W Wo Contrast  Result Date: 01/03/2019 CLINICAL DATA:  Metastatic prostate cancer. EXAM: MRI THORACIC AND LUMBAR SPINE WITHOUT AND WITH CONTRAST TECHNIQUE: Multiplanar and multiecho pulse sequences of the thoracic and lumbar spine were  obtained without and with intravenous contrast. CONTRAST:  10 mL Gadavist intravenous contrast. COMPARISON:  CT abdomen pelvis dated November 28, 2018. Chest x-ray dated July 12, 2018. FINDINGS: MRI THORACIC SPINE FINDINGS Alignment:  Physiologic. Vertebrae: Diffusely decreased T2 and T1 marrow signal with patchy STIR hyperintensity and enhancement involving the thoracic vertebral bodies and posterior elements, consistent with widespread osseous metastatic disease. There is bulging of the posterior vertebral cortices at T3, T6, and T7 with epidural tumor. There are chronic moderate T8 and T9 compression deformities. There are chronic mild T3 and T12 superior endplate compression deformities. No acute fracture or evidence of discitis. Cord: Normal signal. Right anterior and lateral epidural tumor at T3 abutting the cord. Near circumferential epidural tumor tumor at the level of T6 resulting in moderate to severe spinal canal stenosis with deformity of the cord. Bilateral anterior epidural tumor at T7 abutting the ventral cord. No intradural enhancement. Paraspinal and other soft tissues: Diffuse metastases of the bilateral posterior ribs with pathologic fracture of the left posterior sixth rib. Disc levels: T1-T2: Minimal disc bulging.  No stenosis. T2-T3: Negative.  T3-T4: Negative. T4-T5: Negative. T5-T6: Negative disc. Near circumferential epidural tumor at the level of T6 resulting in moderate to severe spinal canal stenosis with deformity of the cord. Mild left neuroforaminal stenosis due to epidural tumor. T6-T7: Moderate spinal canal stenosis. Moderate to severe bilateral neuroforaminal stenosis due to epidural tumor. T7-T8: Tiny central disc protrusion.  No stenosis. T8-T9: Tiny central disc protrusion.  No stenosis. T9-T10: Negative disc. Mild left neuroforaminal stenosis. No spinal canal or right neuroforaminal stenosis. T10-T11: Minimal disc bulging.  No stenosis. T11-T12: Minimal disc bulging.  No stenosis. MRI LUMBAR SPINE FINDINGS Segmentation:  Standard. Alignment:  Trace anterolisthesis at L3-L4. Vertebrae: Diffusely decreased T1 and T2 marrow signal with patchy STIR hyperintensity and enhancement involving the lumbar vertebral bodies, posterior elements, and visualized pelvis, consistent with widespread osseous metastatic disease. Small Schmorl's node along the L1 superior endplate. No acute fracture or evidence of discitis. Conus medullaris: Extends to the T12 level and appears normal. No intradural enhancement. No epidural tumor. Paraspinal and other soft tissues: Negative. Disc levels: T12-L1:  Negative. L1-L2:  Negative. L2-L3:  Shallow biforaminal disc protrusions.  No stenosis. L3-L4: Mild disc bulging and moderate bilateral facet arthropathy. Borderline mild spinal canal stenosis. Mild right lateral recess stenosis. Mild bilateral neuroforaminal stenosis. L4-L5: Mild disc bulging and bilateral facet arthropathy. Mild right neuroforaminal stenosis. No spinal canal or left neuroforaminal stenosis. L5-S1: Normal disc. Mild bilateral facet arthropathy. No stenosis. IMPRESSION: Thoracic spine: 1. Widespread osseous metastatic disease involving the thoracic spine. Near circumferential epidural tumor at the level of T6 with moderate to severe spinal canal  stenosis and deformity of the cord. No abnormal cord signal. 2. Additional epidural tumor at T3 and T7 abutting the cord. Moderate to severe bilateral neuroforaminal stenosis at T6-T7 due to epidural tumor. 3. Chronic T3, T8, T9, and T12 compression deformities. No acute fracture. Lumbar spine: 1. Widespread osseous metastatic disease involving the lumbar spine and visualized pelvis. No epidural tumor or compression fracture. 2. Mild lumbar spondylosis as described above. Electronically Signed   By: Titus Dubin M.D.   On: 01/03/2019 17:36   Mr Lumbar Spine W Wo Contrast  Result Date: 01/03/2019 CLINICAL DATA:  Metastatic prostate cancer. EXAM: MRI THORACIC AND LUMBAR SPINE WITHOUT AND WITH CONTRAST TECHNIQUE: Multiplanar and multiecho pulse sequences of the thoracic and lumbar spine were obtained without and with intravenous contrast. CONTRAST:  10 mL Gadavist  intravenous contrast. COMPARISON:  CT abdomen pelvis dated November 28, 2018. Chest x-ray dated July 12, 2018. FINDINGS: MRI THORACIC SPINE FINDINGS Alignment:  Physiologic. Vertebrae: Diffusely decreased T2 and T1 marrow signal with patchy STIR hyperintensity and enhancement involving the thoracic vertebral bodies and posterior elements, consistent with widespread osseous metastatic disease. There is bulging of the posterior vertebral cortices at T3, T6, and T7 with epidural tumor. There are chronic moderate T8 and T9 compression deformities. There are chronic mild T3 and T12 superior endplate compression deformities. No acute fracture or evidence of discitis. Cord: Normal signal. Right anterior and lateral epidural tumor at T3 abutting the cord. Near circumferential epidural tumor tumor at the level of T6 resulting in moderate to severe spinal canal stenosis with deformity of the cord. Bilateral anterior epidural tumor at T7 abutting the ventral cord. No intradural enhancement. Paraspinal and other soft tissues: Diffuse metastases of the bilateral  posterior ribs with pathologic fracture of the left posterior sixth rib. Disc levels: T1-T2: Minimal disc bulging.  No stenosis. T2-T3: Negative. T3-T4: Negative. T4-T5: Negative. T5-T6: Negative disc. Near circumferential epidural tumor at the level of T6 resulting in moderate to severe spinal canal stenosis with deformity of the cord. Mild left neuroforaminal stenosis due to epidural tumor. T6-T7: Moderate spinal canal stenosis. Moderate to severe bilateral neuroforaminal stenosis due to epidural tumor. T7-T8: Tiny central disc protrusion.  No stenosis. T8-T9: Tiny central disc protrusion.  No stenosis. T9-T10: Negative disc. Mild left neuroforaminal stenosis. No spinal canal or right neuroforaminal stenosis. T10-T11: Minimal disc bulging.  No stenosis. T11-T12: Minimal disc bulging.  No stenosis. MRI LUMBAR SPINE FINDINGS Segmentation:  Standard. Alignment:  Trace anterolisthesis at L3-L4. Vertebrae: Diffusely decreased T1 and T2 marrow signal with patchy STIR hyperintensity and enhancement involving the lumbar vertebral bodies, posterior elements, and visualized pelvis, consistent with widespread osseous metastatic disease. Small Schmorl's node along the L1 superior endplate. No acute fracture or evidence of discitis. Conus medullaris: Extends to the T12 level and appears normal. No intradural enhancement. No epidural tumor. Paraspinal and other soft tissues: Negative. Disc levels: T12-L1:  Negative. L1-L2:  Negative. L2-L3:  Shallow biforaminal disc protrusions.  No stenosis. L3-L4: Mild disc bulging and moderate bilateral facet arthropathy. Borderline mild spinal canal stenosis. Mild right lateral recess stenosis. Mild bilateral neuroforaminal stenosis. L4-L5: Mild disc bulging and bilateral facet arthropathy. Mild right neuroforaminal stenosis. No spinal canal or left neuroforaminal stenosis. L5-S1: Normal disc. Mild bilateral facet arthropathy. No stenosis. IMPRESSION: Thoracic spine: 1. Widespread osseous  metastatic disease involving the thoracic spine. Near circumferential epidural tumor at the level of T6 with moderate to severe spinal canal stenosis and deformity of the cord. No abnormal cord signal. 2. Additional epidural tumor at T3 and T7 abutting the cord. Moderate to severe bilateral neuroforaminal stenosis at T6-T7 due to epidural tumor. 3. Chronic T3, T8, T9, and T12 compression deformities. No acute fracture. Lumbar spine: 1. Widespread osseous metastatic disease involving the lumbar spine and visualized pelvis. No epidural tumor or compression fracture. 2. Mild lumbar spondylosis as described above. Electronically Signed   By: Titus Dubin M.D.   On: 01/03/2019 17:36   Nm Bone Scan Whole Body  Result Date: 01/03/2019 CLINICAL DATA:  Metastatic prostate cancer to bone EXAM: NUCLEAR MEDICINE WHOLE BODY BONE SCAN TECHNIQUE: Whole body anterior and posterior images were obtained approximately 3 hours after intravenous injection of radiopharmaceutical. RADIOPHARMACEUTICALS:  19.4 mCi Technetium-25m MDP IV COMPARISON:  None Radiographic correlation: CT abdomen/pelvis 11/28/2018, MRI thoracic/lumbar spine 01/03/2019 FINDINGS:  Significant diffuse abnormal increased osseous tracer accumulation is identified throughout multiple osseous structures consistent with widespread osseous metastatic disease. This includes calvaria, clavicles, scapula, BILATERAL humeri, sternum, BILATERAL ribs, cervical/thoracic/lumbar spine, pelvis, and BILATERAL femora. Paucity of soft tissue and nearly absent urinary tract localization of tracer consistent with "super scan". IMPRESSION: Widespread osseous metastatic disease involving the axial and appendicular skeleton as above. This includes extensive abnormalities at the humeri and femora bilaterally. Electronically Signed   By: Lavonia Dana M.D.   On: 01/03/2019 17:47   Ir Radiologist Eval & Mgmt  Result Date: 01/15/2019 Please refer to notes tab for details about  interventional procedure. (Op Note)   Labs:  CBC: Recent Labs    07/12/18 0841 09/07/18 0514 11/20/18 1138 01/01/19 1200  WBC 17.4* 9.8 12.4* 7.4  HGB 16.1 16.7 10.8* 10.1*  HCT 50.5 54.2* 32.2* 32.6*  PLT 290 317 332 263    COAGS: Recent Labs    09/07/18 0514  INR 1.06    BMP: Recent Labs    07/12/18 0841 09/07/18 0514 11/20/18 1138 01/01/19 1200  NA 135 136 134 138  K 3.0* 3.3* 3.6 3.4*  CL 100 101 94* 103  CO2 24 14* 23 27  GLUCOSE 106* 155* 94 104*  BUN 8 7 11 14   CALCIUM 9.4 9.9 9.6 9.4  CREATININE 1.40* 1.37* 0.79 0.92  GFRNONAA 57* 58* 103 >60  GFRAA >60 >60 119 >60    LIVER FUNCTION TESTS: Recent Labs    07/12/18 0841 09/07/18 0514 11/20/18 1138 11/21/18 1101 01/01/19 1200  BILITOT 0.8 1.1 0.5  --  0.6  AST 41 56* 77*  --  23  ALT 28 26 17   --  16  ALKPHOS 99 417* 2,341* 2,365* 2,910*  PROT 7.6 8.1 7.6  --  7.8  ALBUMIN 4.0 4.2 4.1  --  4.2    TUMOR MARKERS: No results for input(s): AFPTM, CEA, CA199, CHROMGRNA in the last 8760 hours.  Assessment and Plan: My impression is that the patient has extensive osseous metastatic disease almost certainly related to his metastatic prostate carcinoma.  He has compression deformities in his lower thoracic spine.  However, these appear Chronic.  There is no increased T2 activity on MR to suggest a subacute unhealed component.  In retrospect, these were evident on chest radiography dating back to 07/12/2018.    I do not think he is a candidate for vertebral augmentation (kyphoplasty or vertebroplasty with or without RF ablation) at any specific level at this time.  Obviously, should he develop worsening symptoms secondary to a new pathologic compression fracture, we could revisit this assessment.  I reviewed the imaging with the patient.  We discussed the pathophysiology of vertebral compression deformities and their natural history.  We discussed briefly the technique of vertebral augmentation.  I  explained why he is currently not a candidate.  He seemed to understand, and did ask appropriate questions which were answered.  He will follow-up primarily with you.   Thank you for this interesting consult.  I greatly enjoyed meeting Roy Rosales and look forward to participating in their care.  A copy of this report was sent to the requesting provider on this date.  Electronically Signed: Rickard Rhymes 01/16/2019, 12:33 PM   I spent a total of  30 Minutes   in face to face in clinical consultation, greater than 50% of which was counseling/coordinating care for pathologic thoracic compression deformities.

## 2019-01-17 ENCOUNTER — Other Ambulatory Visit: Payer: Self-pay

## 2019-01-17 ENCOUNTER — Telehealth: Payer: Self-pay | Admitting: Radiation Oncology

## 2019-01-17 MED ORDER — ONDANSETRON HCL 8 MG PO TABS: 8.0000 mg | ORAL_TABLET | Freq: Three times a day (TID) | ORAL | 0 refills | Status: DC | PRN

## 2019-01-17 MED FILL — ONDANSETRON HCL 8 MG TABLET: 8 | 6 days supply | Qty: 20 | Fill #0

## 2019-01-17 NOTE — Telephone Encounter (Signed)
Patient came to office and stated that he has had nausea and vomiting for 4 days and the compazine has not been effective. He stated that he tolerated the chemotherapy but feels like the nausea and vomiting was related to radiation. This RN left message for Aldona Bar RN to follow up on symptom management for radiation and requested return call. New order for Zofran per Dr. Alen Blew sent to [facility] after confirming that patient still had money on his account to cover the coast of the zofran. Contacted patient and made aware of new prescription and funds available. Patient had no other questions or concerns.

## 2019-01-17 NOTE — Telephone Encounter (Signed)
Understand from Seth Bake, [place] for Dr. Alen Blew that the patient has been escribed zofran to better manage his nausea and vomiting. Phoned patient to follow up. Explained that radiation side effect occur in the area of treatment. Explained that the area of his T spine we radiated could definitely be causing his nausea and vomiting. Explained that typically symptoms resolve 1-2 weeks s/p treatment. Encouraged patient to take Zofran scheduled instead of prn through the weekend then consider backing off the dose in the coming week as he recovers from radiation therapy. Encouraged patient to maintain a BRAT diet and phone this RN with additional needs reference this matter. Patient verbalized understanding via teachback method and expressed appreciation for the return call.

## 2019-01-20 ENCOUNTER — Other Ambulatory Visit: Payer: Self-pay

## 2019-01-20 ENCOUNTER — Encounter (HOSPITAL_COMMUNITY): Payer: Self-pay | Admitting: *Deleted

## 2019-01-20 ENCOUNTER — Emergency Department (HOSPITAL_COMMUNITY)
Admission: EM | Admit: 2019-01-20 | Discharge: 2019-01-20 | Disposition: A | Discharge status: Home / Self Care | Payer: No Typology Code available for payment source | Attending: Emergency Medicine | Admitting: Emergency Medicine

## 2019-01-20 DIAGNOSIS — Z8546 Personal history of malignant neoplasm of prostate: Secondary | ICD-10-CM | POA: Diagnosis not present

## 2019-01-20 DIAGNOSIS — Z79899 Other long term (current) drug therapy: Secondary | ICD-10-CM | POA: Insufficient documentation

## 2019-01-20 DIAGNOSIS — J449 Chronic obstructive pulmonary disease, unspecified: Secondary | ICD-10-CM | POA: Insufficient documentation

## 2019-01-20 DIAGNOSIS — F17210 Nicotine dependence, cigarettes, uncomplicated: Secondary | ICD-10-CM | POA: Diagnosis not present

## 2019-01-20 DIAGNOSIS — M542 Cervicalgia: Secondary | ICD-10-CM | POA: Diagnosis present

## 2019-01-20 DIAGNOSIS — I10 Essential (primary) hypertension: Secondary | ICD-10-CM | POA: Insufficient documentation

## 2019-01-20 NOTE — ED Notes (Signed)
Best contact for patient: Mother [phone]

## 2019-01-20 NOTE — Discharge Instructions (Addendum)
Please read attached information. If you experience any new or worsening signs or symptoms please return to the emergency room for evaluation. Please follow-up with your primary care provider or specialist as discussed.  °
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
## 2019-01-23 ENCOUNTER — Inpatient Hospital Stay: Payer: Medicaid Other

## 2019-01-23 ENCOUNTER — Other Ambulatory Visit: Payer: Self-pay | Admitting: *Deleted

## 2019-01-23 ENCOUNTER — Telehealth: Payer: Self-pay | Admitting: *Deleted

## 2019-01-23 ENCOUNTER — Other Ambulatory Visit: Payer: Self-pay

## 2019-01-23 ENCOUNTER — Ambulatory Visit: Payer: Self-pay

## 2019-01-23 ENCOUNTER — Encounter: Payer: Self-pay | Admitting: *Deleted

## 2019-01-23 ENCOUNTER — Telehealth: Payer: Self-pay | Admitting: Oncology

## 2019-01-23 ENCOUNTER — Inpatient Hospital Stay (HOSPITAL_BASED_OUTPATIENT_CLINIC_OR_DEPARTMENT_OTHER): Payer: Medicaid Other | Admitting: Oncology

## 2019-01-23 VITALS — BP 112/71 | HR 93 | Temp 98.2°F | Resp 18 | Ht 73.0 in | Wt 214.1 lb

## 2019-01-23 DIAGNOSIS — R590 Localized enlarged lymph nodes: Secondary | ICD-10-CM

## 2019-01-23 DIAGNOSIS — C7951 Secondary malignant neoplasm of bone: Principal | ICD-10-CM

## 2019-01-23 DIAGNOSIS — Z510 Encounter for antineoplastic radiation therapy: Secondary | ICD-10-CM | POA: Diagnosis not present

## 2019-01-23 DIAGNOSIS — C61 Malignant neoplasm of prostate: Secondary | ICD-10-CM

## 2019-01-23 DIAGNOSIS — Z79899 Other long term (current) drug therapy: Secondary | ICD-10-CM

## 2019-01-23 DIAGNOSIS — M545 Low back pain: Secondary | ICD-10-CM

## 2019-01-23 DIAGNOSIS — Z79818 Long term (current) use of other agents affecting estrogen receptors and estrogen levels: Secondary | ICD-10-CM

## 2019-01-23 DIAGNOSIS — G893 Neoplasm related pain (acute) (chronic): Secondary | ICD-10-CM

## 2019-01-23 DIAGNOSIS — C799 Secondary malignant neoplasm of unspecified site: Secondary | ICD-10-CM

## 2019-01-23 LAB — CBC WITH DIFFERENTIAL (CANCER CENTER ONLY)
Abs Immature Granulocytes: 0.06 10*3/uL (ref 0.00–0.07)
BASOS PCT: 1 %
Basophils Absolute: 0 10*3/uL (ref 0.0–0.1)
Eosinophils Absolute: 0.2 10*3/uL (ref 0.0–0.5)
Eosinophils Relative: 4 %
HCT: 30.8 % — ABNORMAL LOW (ref 39.0–52.0)
Hemoglobin: 9.5 g/dL — ABNORMAL LOW (ref 13.0–17.0)
Immature Granulocytes: 1 %
Lymphocytes Relative: 13 %
Lymphs Abs: 0.8 10*3/uL (ref 0.7–4.0)
MCH: 30.2 pg (ref 26.0–34.0)
MCHC: 30.8 g/dL (ref 30.0–36.0)
MCV: 97.8 fL (ref 80.0–100.0)
Monocytes Absolute: 0.9 10*3/uL (ref 0.1–1.0)
Monocytes Relative: 14 %
Neutro Abs: 4.2 10*3/uL (ref 1.7–7.7)
Neutrophils Relative %: 67 %
PLATELETS: 252 10*3/uL (ref 150–400)
RBC: 3.15 MIL/uL — ABNORMAL LOW (ref 4.22–5.81)
RDW: 23 % — ABNORMAL HIGH (ref 11.5–15.5)
WBC Count: 6.2 10*3/uL (ref 4.0–10.5)
nRBC: 2.6 % — ABNORMAL HIGH (ref 0.0–0.2)

## 2019-01-23 LAB — CMP (CANCER CENTER ONLY)
ALT: 11 U/L (ref 0–44)
AST: 18 U/L (ref 15–41)
Albumin: 3.6 g/dL (ref 3.5–5.0)
Alkaline Phosphatase: 2473 U/L — ABNORMAL HIGH (ref 38–126)
Anion gap: 11 (ref 5–15)
BUN: 9 mg/dL (ref 6–20)
CO2: 26 mmol/L (ref 22–32)
Calcium: 8.6 mg/dL — ABNORMAL LOW (ref 8.9–10.3)
Chloride: 101 mmol/L (ref 98–111)
Creatinine: 0.84 mg/dL (ref 0.61–1.24)
GFR, Est AFR Am: >60 mL/min (ref >60)
GFR, Estimated: >60 mL/min (ref >60)
Glucose, Bld: 117 mg/dL — ABNORMAL HIGH (ref 70–99)
Potassium: 2.7 mmol/L — CL (ref 3.5–5.1)
Sodium: 138 mmol/L (ref 135–145)
TOTAL PROTEIN: 7.3 g/dL (ref 6.5–8.1)
Total Bilirubin: 0.5 mg/dL (ref 0.3–1.2)

## 2019-01-23 MED ORDER — LEUPROLIDE ACETATE (4 MONTH) 30 MG IM KIT
30.0000 mg | PACK | Freq: Once | INTRAMUSCULAR | Status: AC
  Administered 2019-01-23: 30 mg via INTRAMUSCULAR
  Filled 2019-01-23: qty 30

## 2019-01-23 MED ORDER — SODIUM CHLORIDE 0.9 % IV SOLN
Freq: Once | INTRAVENOUS | Status: AC
  Administered 2019-01-23: 10:00:00 via INTRAVENOUS
  Filled 2019-01-23: qty 250

## 2019-01-23 MED ORDER — DEXAMETHASONE SODIUM PHOSPHATE 10 MG/ML IJ SOLN
10.0000 mg | Freq: Once | INTRAMUSCULAR | Status: AC
  Administered 2019-01-23: 10 mg via INTRAVENOUS

## 2019-01-23 MED ORDER — DEXAMETHASONE SODIUM PHOSPHATE 10 MG/ML IJ SOLN
INTRAMUSCULAR | Status: AC
  Filled 2019-01-23: qty 1

## 2019-01-23 MED ORDER — SODIUM CHLORIDE 0.9 % IV SOLN
75.0000 mg/m2 | Freq: Once | INTRAVENOUS | Status: AC
  Administered 2019-01-23: 170 mg via INTRAVENOUS
  Filled 2019-01-23: qty 17

## 2019-01-23 MED ORDER — POTASSIUM CHLORIDE CRYS ER 20 MEQ PO TBCR: 40.0000 meq | EXTENDED_RELEASE_TABLET | Freq: Every day | ORAL | 0 refills | Status: DC

## 2019-01-23 MED ORDER — ONDANSETRON HCL 8 MG PO TABS: 8.0000 mg | ORAL_TABLET | Freq: Three times a day (TID) | ORAL | 0 refills | Status: DC | PRN

## 2019-01-23 NOTE — Telephone Encounter (Signed)
CALLED PATIENT AND CONFIRMED THAT Roy Rosales WILL CALL HIM ON 02-12-19 @ 9:20 AM FOR FU DUE TO COVID 19, PATIENT VERIFIED UNDERSTANDING THIS

## 2019-01-23 NOTE — Progress Notes (Signed)
Per Dr. Alen Blew, ok to treat today with K 2.7 and alk phos [id number]
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
## 2019-01-23 NOTE — Telephone Encounter (Signed)
Gave avs and calendar ° °

## 2019-01-23 NOTE — Progress Notes (Signed)
Critical value received.  Potassium 2.7.  Secure chat sent to Randolm Idol, RN.  Message acknowledged.

## 2019-01-23 NOTE — Patient Instructions (Addendum)
Leuprolide injection(lupron) What is this medicine? LEUPROLIDE (loo PROE lide) is a man-made hormone. It is used to treat the symptoms of prostate cancer. This medicine may also be used to treat children with early onset of puberty. It may be used for other hormonal conditions. This medicine may be used for other purposes; ask your health care provider or pharmacist if you have questions. COMMON BRAND NAME(S): Lupron What should I tell my health care provider before I take this medicine? They need to know if you have any of these conditions: -diabetes -heart disease or previous heart attack -high blood pressure -high cholesterol -pain or difficulty passing urine -spinal cord metastasis -stroke -tobacco smoker -an unusual or allergic reaction to leuprolide, benzyl alcohol, other medicines, foods, dyes, or preservatives -pregnant or trying to get pregnant -breast-feeding How should I use this medicine? This medicine is for injection under the skin or into a muscle. You will be taught how to prepare and give this medicine. Use exactly as directed. Take your medicine at regular intervals. Do not take your medicine more often than directed. It is important that you put your used needles and syringes in a special sharps container. Do not put them in a trash can. If you do not have a sharps container, call your pharmacist or healthcare provider to get one. A special MedGuide will be given to you by the pharmacist with each prescription and refill. Be sure to read this information carefully each time. Talk to your pediatrician regarding the use of this medicine in children. While this medicine may be prescribed for children as young as 8 years for selected conditions, precautions do apply. Overdosage: If you think you have taken too much of this medicine contact a poison control center or emergency room at once. NOTE: This medicine is only for you. Do not share this medicine with others. What if I  miss a dose? If you miss a dose, take it as soon as you can. If it is almost time for your next dose, take only that dose. Do not take double or extra doses. What may interact with this medicine? Do not take this medicine with any of the following medications: -chasteberry This medicine may also interact with the following medications: -herbal or dietary supplements, like black cohosh or DHEA -male hormones, like estrogens or progestins and birth control pills, patches, rings, or injections -male hormones, like testosterone This list may not describe all possible interactions. Give your health care provider a list of all the medicines, herbs, non-prescription drugs, or dietary supplements you use. Also tell them if you smoke, drink alcohol, or use illegal drugs. Some items may interact with your medicine. What should I watch for while using this medicine? Visit your doctor or health care professional for regular checks on your progress. During the first week, your symptoms may get worse, but then will improve as you continue your treatment. You may get hot flashes, increased bone pain, increased difficulty passing urine, or an aggravation of nerve symptoms. Discuss these effects with your doctor or health care professional, some of them may improve with continued use of this medicine. Male patients may experience a menstrual cycle or spotting during the first 2 months of therapy with this medicine. If this continues, contact your doctor or health care professional. What side effects may I notice from receiving this medicine? Side effects that you should report to your doctor or health care professional as soon as possible: -allergic reactions like skin rash, itching or  hives, swelling of the face, lips, or tongue -breathing problems -chest pain -depression or memory disorders -pain in your legs or groin -pain at site where injected -severe headache -swelling of the feet and legs -visual  changes -vomiting Side effects that usually do not require medical attention (report to your doctor or health care professional if they continue or are bothersome): -breast swelling or tenderness -decrease in sex drive or performance -diarrhea -hot flashes -loss of appetite -muscle, joint, or bone pains -nausea -redness or irritation at site where injected -skin problems or acne This list may not describe all possible side effects. Call your doctor for medical advice about side effects. You may report side effects to FDA at 1-800-FDA-1088. Where should I keep my medicine? Keep out of the reach of children. Store below 25 degrees C (77 degrees F). Do not freeze. Protect from light. Do not use if it is not clear or if there are particles present. Throw away any unused medicine after the expiration date. NOTE: This sheet is a summary. It may not cover all possible information. If you have questions about this medicine, talk to your doctor, pharmacist, or health care provider.  2019 Elsevier/Gold Standard (2016-06-05 10:54:35) [facility] Discharge Instructions for Patients Receiving Chemotherapy  Today you received the following chemotherapy agents Docetaxel (TAXOTERE).  To help prevent nausea and vomiting after your treatment, we encourage you to take your nausea medication as prescribed.   If you develop nausea and vomiting that is not controlled by your nausea medication, call the clinic.   BELOW ARE SYMPTOMS THAT SHOULD BE REPORTED IMMEDIATELY:  *FEVER GREATER THAN 100.5 F  *CHILLS WITH OR WITHOUT FEVER  NAUSEA AND VOMITING THAT IS NOT CONTROLLED WITH YOUR NAUSEA MEDICATION  *UNUSUAL SHORTNESS OF BREATH  *UNUSUAL BRUISING OR BLEEDING  TENDERNESS IN MOUTH AND THROAT WITH OR WITHOUT PRESENCE OF ULCERS  *URINARY PROBLEMS  *BOWEL PROBLEMS  UNUSUAL RASH Items with * indicate a potential emergency and should be followed up as soon as possible.  Feel free to  call the clinic should you have any questions or concerns. The clinic phone number is [phone].  Please show the [place] at check-in to the Emergency Department and triage nurse.  Docetaxel injection What is this medicine? DOCETAXEL (doe se TAX el) is a chemotherapy drug. It targets fast dividing cells, like cancer cells, and causes these cells to die. This medicine is used to treat many types of cancers like breast cancer, certain stomach cancers, head and neck cancer, lung cancer, and prostate cancer. This medicine may be used for other purposes; ask your health care provider or pharmacist if you have questions. COMMON BRAND NAME(S): Docefrez, Taxotere What should I tell my health care provider before I take this medicine? They need to know if you have any of these conditions: -infection (especially a virus infection such as chickenpox, cold sores, or herpes) -liver disease -low blood counts, like low white cell, platelet, or red cell counts -an unusual or allergic reaction to docetaxel, polysorbate 80, other chemotherapy agents, other medicines, foods, dyes, or preservatives -pregnant or trying to get pregnant -breast-feeding How should I use this medicine? This drug is given as an infusion into a vein. It is administered in a hospital or clinic by a specially trained health care professional. Talk to your pediatrician regarding the use of this medicine in children. Special care may be needed. Overdosage: If you think you have taken too much of this medicine contact  a poison control center or emergency room at once. NOTE: This medicine is only for you. Do not share this medicine with others. What if I miss a dose? It is important not to miss your dose. Call your doctor or health care professional if you are unable to keep an appointment. What may interact with this medicine? -cyclosporine -erythromycin -ketoconazole -medicines to increase blood counts like filgrastim,  pegfilgrastim, sargramostim -vaccines Talk to your doctor or health care professional before taking any of these medicines: -acetaminophen -aspirin -ibuprofen -ketoprofen -naproxen This list may not describe all possible interactions. Give your health care provider a list of all the medicines, herbs, non-prescription drugs, or dietary supplements you use. Also tell them if you smoke, drink alcohol, or use illegal drugs. Some items may interact with your medicine. What should I watch for while using this medicine? Your condition will be monitored carefully while you are receiving this medicine. You will need important blood work done while you are taking this medicine. This drug may make you feel generally unwell. This is not uncommon, as chemotherapy can affect healthy cells as well as cancer cells. Report any side effects. Continue your course of treatment even though you feel ill unless your doctor tells you to stop. In some cases, you may be given additional medicines to help with side effects. Follow all directions for their use. Call your doctor or health care professional for advice if you get a fever, chills or sore throat, or other symptoms of a cold or flu. Do not treat yourself. This drug decreases your body's ability to fight infections. Try to avoid being around people who are sick. This medicine may increase your risk to bruise or bleed. Call your doctor or health care professional if you notice any unusual bleeding. This medicine may contain alcohol in the product. You may get drowsy or dizzy. Do not drive, use machinery, or do anything that needs mental alertness until you know how this medicine affects you. Do not stand or sit up quickly, especially if you are an older patient. This reduces the risk of dizzy or fainting spells. Avoid alcoholic drinks. Do not become pregnant while taking this medicine or for 6 months after stopping it. Women should inform their doctor if they wish to  become pregnant or think they might be pregnant. Men should not father a child while taking this medicine and for 3 months after stopping it. There is a potential for serious side effects to an unborn child. Talk to your health care professional or pharmacist for more information. Do not breast-feed an infant while taking this medicine or for 2 weeks after stopping it. This may interfere with the ability to father a child. You should talk to your doctor or health care professional if you are concerned about your fertility. What side effects may I notice from receiving this medicine? Side effects that you should report to your doctor or health care professional as soon as possible: -allergic reactions like skin rash, itching or hives, swelling of the face, lips, or tongue -low blood counts - This drug may decrease the number of white blood cells, red blood cells and platelets. You may be at increased risk for infections and bleeding. -signs of infection - fever or chills, cough, sore throat, pain or difficulty passing urine -signs of decreased platelets or bleeding - bruising, pinpoint red spots on the skin, black, tarry stools, nosebleeds -signs of decreased red blood cells - unusually weak or tired, fainting spells, lightheadedness -  breathing problems -fast or irregular heartbeat -low blood pressure -mouth sores -nausea and vomiting -pain, swelling, redness or irritation at the injection site -pain, tingling, numbness in the hands or feet -swelling of the ankle, feet, hands -weight gain Side effects that usually do not require medical attention (report to your doctor or health care professional if they continue or are bothersome): -bone pain -complete hair loss including hair on your head, underarms, pubic hair, eyebrows, and eyelashes -diarrhea -excessive tearing -changes in the color of fingernails -loosening of the fingernails -nausea -muscle pain -red flush to skin -sweating -weak  or tired This list may not describe all possible side effects. Call your doctor for medical advice about side effects. You may report side effects to FDA at 1-800-FDA-1088. Where should I keep my medicine? This drug is given in a hospital or clinic and will not be stored at home. NOTE: This sheet is a summary. It may not cover all possible information. If you have questions about this medicine, talk to your doctor, pharmacist, or health care provider.  2019 Elsevier/Gold Standard (2017-11-12 12:07:21)  Coronavirus (COVID-19) Are you at risk?  Are you at risk for the Coronavirus (COVID-19)?  To be considered HIGH RISK for Coronavirus (COVID-19), you have to meet the following criteria:  . Traveled to [place], [place], [place], [place] or [place]; or in the [place] to [place], [place], [place], or [place]; and have fever, cough, and shortness of breath within the last 2 weeks of travel OR . Been in close contact with a person diagnosed with COVID-19 within the last 2 weeks and have fever, cough, and shortness of breath . IF YOU DO NOT MEET THESE CRITERIA, YOU ARE CONSIDERED LOW RISK FOR COVID-19.  What to do if you are HIGH RISK for COVID-19?  Marland Kitchen If you are having a medical emergency, call 911. . Seek medical care right away. Before you go to a doctor's office, urgent care or emergency department, call ahead and tell them about your recent travel, contact with someone diagnosed with COVID-19, and your symptoms. You should receive instructions from your physician's office regarding next steps of care.  . When you arrive at healthcare provider, tell the healthcare staff immediately you have returned from visiting [place], [place], [place], [place] or [place]; or traveled in the [place] to [place], [place], [place], or [place]; in the last two weeks or you have been in close contact with a person diagnosed with COVID-19 in the last 2 weeks.   . Tell the health care staff  about your symptoms: fever, cough and shortness of breath. . After you have been seen by a medical provider, you will be either: o Tested for (COVID-19) and discharged home on quarantine except to seek medical care if symptoms worsen, and asked to  - Stay home and avoid contact with others until you get your results (4-5 days)  - Avoid travel on public transportation if possible (such as bus, train, or airplane) or o Sent to the Emergency Department by EMS for evaluation, COVID-19 testing, and possible admission depending on your condition and test results.  What to do if you are LOW RISK for COVID-19?  Reduce your risk of any infection by using the same precautions used for avoiding the common cold or flu:  Marland Kitchen Wash your hands often with soap and warm water for at least 20 seconds.  If soap and water are not readily available, use an alcohol-based hand sanitizer  with at least 60% alcohol.  . If coughing or sneezing, cover your mouth and nose by coughing or sneezing into the elbow areas of your shirt or coat, into a tissue or into your sleeve (not your hands). . Avoid shaking hands with others and consider head nods or verbal greetings only. . Avoid touching your eyes, nose, or mouth with unwashed hands.  . Avoid close contact with people who are sick. . Avoid places or events with large numbers of people in one location, like concerts or sporting events. . Carefully consider travel plans you have or are making. . If you are planning any travel outside or inside the [place], visit the CDC's Travelers' Health webpage for the latest health notices. . If you have some symptoms but not all symptoms, continue to monitor at home and seek medical attention if your symptoms worsen. . If you are having a medical emergency, call 911.   [place] / e-Visit: [email or web address]         [facility]:  [facility]: [phone]                   [facility]: [phone]

## 2019-01-24 LAB — PROSTATE-SPECIFIC AG, SERUM (LABCORP): Prostate Specific Ag, Serum: 8.2 ng/mL — ABNORMAL HIGH (ref 0.0–4.0)

## 2019-01-24 MED FILL — ONDANSETRON HCL 8 MG TABLET: 8 | 7 days supply | Qty: 20 | Fill #0

## 2019-01-25 ENCOUNTER — Other Ambulatory Visit: Payer: Self-pay

## 2019-01-25 ENCOUNTER — Inpatient Hospital Stay: Payer: Medicaid Other

## 2019-01-25 VITALS — BP 135/89 | HR 101 | Temp 98.1°F | Resp 18

## 2019-01-25 DIAGNOSIS — C7951 Secondary malignant neoplasm of bone: Principal | ICD-10-CM

## 2019-01-25 DIAGNOSIS — C61 Malignant neoplasm of prostate: Secondary | ICD-10-CM

## 2019-01-25 DIAGNOSIS — Z510 Encounter for antineoplastic radiation therapy: Secondary | ICD-10-CM | POA: Diagnosis not present

## 2019-01-25 MED ORDER — PEGFILGRASTIM INJECTION 6 MG/0.6ML ~~LOC~~
6.0000 mg | PREFILLED_SYRINGE | Freq: Once | SUBCUTANEOUS | Status: AC
  Administered 2019-01-25: 6 mg via SUBCUTANEOUS

## 2019-01-25 MED ORDER — PEGFILGRASTIM INJECTION 6 MG/0.6ML ~~LOC~~
PREFILLED_SYRINGE | SUBCUTANEOUS | Status: AC
  Filled 2019-01-25: qty 0.6

## 2019-01-25 NOTE — Patient Instructions (Signed)
Pegfilgrastim injection  What is this medicine?  PEGFILGRASTIM (PEG fil gra stim) is a long-acting granulocyte colony-stimulating factor that stimulates the growth of neutrophils, a type of white blood cell important in the body's fight against infection. It is used to reduce the incidence of fever and infection in patients with certain types of cancer who are receiving chemotherapy that affects the bone marrow, and to increase survival after being exposed to high doses of radiation.  This medicine may be used for other purposes; ask your health care provider or pharmacist if you have questions.  COMMON BRAND NAME(S): Fulphila, Neulasta, UDENYCA  What should I tell my health care provider before I take this medicine?  They need to know if you have any of these conditions:  -kidney disease  -latex allergy  -ongoing radiation therapy  -sickle cell disease  -skin reactions to acrylic adhesives (On-Body Injector only)  -an unusual or allergic reaction to pegfilgrastim, filgrastim, other medicines, foods, dyes, or preservatives  -pregnant or trying to get pregnant  -breast-feeding  How should I use this medicine?  This medicine is for injection under the skin. If you get this medicine at home, you will be taught how to prepare and give the pre-filled syringe or how to use the On-body Injector. Refer to the patient Instructions for Use for detailed instructions. Use exactly as directed. Tell your healthcare provider immediately if you suspect that the On-body Injector may not have performed as intended or if you suspect the use of the On-body Injector resulted in a missed or partial dose.  It is important that you put your used needles and syringes in a special sharps container. Do not put them in a trash can. If you do not have a sharps container, call your pharmacist or healthcare provider to get one.  Talk to your pediatrician regarding the use of this medicine in children. While this drug may be prescribed for  selected conditions, precautions do apply.  Overdosage: If you think you have taken too much of this medicine contact a poison control center or emergency room at once.  NOTE: This medicine is only for you. Do not share this medicine with others.  What if I miss a dose?  It is important not to miss your dose. Call your doctor or health care professional if you miss your dose. If you miss a dose due to an On-body Injector failure or leakage, a new dose should be administered as soon as possible using a single prefilled syringe for manual use.  What may interact with this medicine?  Interactions have not been studied.  Give your health care provider a list of all the medicines, herbs, non-prescription drugs, or dietary supplements you use. Also tell them if you smoke, drink alcohol, or use illegal drugs. Some items may interact with your medicine.  This list may not describe all possible interactions. Give your health care provider a list of all the medicines, herbs, non-prescription drugs, or dietary supplements you use. Also tell them if you smoke, drink alcohol, or use illegal drugs. Some items may interact with your medicine.  What should I watch for while using this medicine?  You may need blood work done while you are taking this medicine.  If you are going to need a MRI, CT scan, or other procedure, tell your doctor that you are using this medicine (On-Body Injector only).  What side effects may I notice from receiving this medicine?  Side effects that you should report to   your doctor or health care professional as soon as possible:  -allergic reactions like skin rash, itching or hives, swelling of the face, lips, or tongue  -back pain  -dizziness  -fever  -pain, redness, or irritation at site where injected  -pinpoint red spots on the skin  -red or dark-brown urine  -shortness of breath or breathing problems  -stomach or side pain, or pain at the shoulder  -swelling  -tiredness  -trouble passing urine or  change in the amount of urine  Side effects that usually do not require medical attention (report to your doctor or health care professional if they continue or are bothersome):  -bone pain  -muscle pain  This list may not describe all possible side effects. Call your doctor for medical advice about side effects. You may report side effects to FDA at 1-800-FDA-1088.  Where should I keep my medicine?  Keep out of the reach of children.  If you are using this medicine at home, you will be instructed on how to store it. Throw away any unused medicine after the expiration date on the label.  NOTE: This sheet is a summary. It may not cover all possible information. If you have questions about this medicine, talk to your doctor, pharmacist, or health care provider.   2019 Elsevier/Gold Standard (2018-01-21 16:57:08)

## 2019-01-31 ENCOUNTER — Encounter: Payer: Self-pay | Admitting: Radiation Oncology

## 2019-01-31 NOTE — Progress Notes (Signed)
  Radiation Oncology         [id number]) [phone] ________________________________  Name: Roy Rosales MRN: [id number]  Date: 01/31/2019  DOB: 11-09-1965  End of Treatment Note  Diagnosis:   53 y.o. 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   Indication for treatment:  Palliative       Radiation treatment dates:   01/01/2019 - 01/14/2019  Site/dose:   T8-9, T12 / 30 Gy in 10 fractions of 3 Gy  Beams/energy:   3D, photons / 15X  Narrative: The patient tolerated radiation treatment relatively well. He experienced continued, but improved spinal pain throughout treatment. Towards the end of treatment, he reported mild fatigue and nausea. He denied diarrhea, cough, sore throat, and skin changes.  Plan: The patient has completed radiation treatment. The patient will return to radiation oncology clinic for routine followup in one month. I advised him to call or return sooner if he has any questions or concerns related to his recovery or treatment. ________________________________  Sheral Apley. Tammi Klippel, M.D.   This document serves as a record of services personally performed by Tyler Pita, MD. It was created on his behalf by Wilburn Mylar, a trained medical scribe. The creation of this record is based on the scribe's personal observations and the provider's statements to them. This document has been checked and approved by the attending provider.

## 2019-02-05 ENCOUNTER — Telehealth: Payer: Self-pay | Admitting: Urology

## 2019-02-05 ENCOUNTER — Ambulatory Visit: Payer: Self-pay | Admitting: Urology
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
## 2019-02-10 ENCOUNTER — Other Ambulatory Visit: Payer: Self-pay | Admitting: Oncology

## 2019-02-10 ENCOUNTER — Telehealth: Payer: Self-pay | Admitting: *Deleted

## 2019-02-10 MED ORDER — OXYCODONE-ACETAMINOPHEN 10-325 MG PO TABS: 1.0000 | ORAL_TABLET | ORAL | 0 refills | Status: DC | PRN

## 2019-02-10 MED ORDER — ONDANSETRON HCL 8 MG PO TABS: 8.0000 mg | ORAL_TABLET | Freq: Three times a day (TID) | ORAL | 1 refills | Status: DC | PRN

## 2019-02-10 NOTE — Telephone Encounter (Signed)
Patient called requesting medication refill for Oxycodone and Zofran.  Routed request to MD.

## 2019-02-10 NOTE — Telephone Encounter (Signed)
Done

## 2019-02-12 ENCOUNTER — Ambulatory Visit: Payer: Self-pay | Admitting: Family Medicine

## 2019-02-13 ENCOUNTER — Inpatient Hospital Stay: Payer: Medicaid Other

## 2019-02-13 ENCOUNTER — Inpatient Hospital Stay: Payer: Medicaid Other | Attending: Oncology | Admitting: Oncology

## 2019-02-13 ENCOUNTER — Other Ambulatory Visit: Payer: Self-pay

## 2019-02-13 ENCOUNTER — Telehealth: Payer: Self-pay | Admitting: Oncology

## 2019-02-13 VITALS — BP 120/72 | HR 83 | Temp 98.5°F | Resp 18 | Ht 73.0 in | Wt 205.6 lb

## 2019-02-13 DIAGNOSIS — C7951 Secondary malignant neoplasm of bone: Secondary | ICD-10-CM | POA: Insufficient documentation

## 2019-02-13 DIAGNOSIS — Z9221 Personal history of antineoplastic chemotherapy: Secondary | ICD-10-CM | POA: Diagnosis not present

## 2019-02-13 DIAGNOSIS — Z923 Personal history of irradiation: Secondary | ICD-10-CM | POA: Diagnosis not present

## 2019-02-13 DIAGNOSIS — C61 Malignant neoplasm of prostate: Secondary | ICD-10-CM | POA: Diagnosis present

## 2019-02-13 DIAGNOSIS — Z5111 Encounter for antineoplastic chemotherapy: Secondary | ICD-10-CM | POA: Insufficient documentation

## 2019-02-13 LAB — CMP (CANCER CENTER ONLY)
ALT: 13 U/L (ref 0–44)
AST: 21 U/L (ref 15–41)
Albumin: 3.5 g/dL (ref 3.5–5.0)
Alkaline Phosphatase: 2139 U/L — ABNORMAL HIGH (ref 38–126)
Anion gap: 11 (ref 5–15)
BUN: 8 mg/dL (ref 6–20)
CO2: 23 mmol/L (ref 22–32)
Calcium: 9 mg/dL (ref 8.9–10.3)
Chloride: 104 mmol/L (ref 98–111)
Creatinine: 0.82 mg/dL (ref 0.61–1.24)
GFR, Est AFR Am: >60 mL/min (ref >60)
GFR, Estimated: >60 mL/min (ref >60)
Glucose, Bld: 113 mg/dL — ABNORMAL HIGH (ref 70–99)
Potassium: 3 mmol/L — CL (ref 3.5–5.1)
Sodium: 138 mmol/L (ref 135–145)
Total Bilirubin: 0.4 mg/dL (ref 0.3–1.2)
Total Protein: 7.2 g/dL (ref 6.5–8.1)

## 2019-02-13 LAB — CBC WITH DIFFERENTIAL (CANCER CENTER ONLY)
Abs Immature Granulocytes: 0.04 10*3/uL (ref 0.00–0.07)
Basophils Absolute: 0 10*3/uL (ref 0.0–0.1)
Basophils Relative: 0 %
Eosinophils Absolute: 0.1 10*3/uL (ref 0.0–0.5)
Eosinophils Relative: 1 %
HCT: 30.7 % — ABNORMAL LOW (ref 39.0–52.0)
Hemoglobin: 9.3 g/dL — ABNORMAL LOW (ref 13.0–17.0)
Immature Granulocytes: 1 %
Lymphocytes Relative: 14 %
Lymphs Abs: 0.8 10*3/uL (ref 0.7–4.0)
MCH: 30.4 pg (ref 26.0–34.0)
MCHC: 30.3 g/dL (ref 30.0–36.0)
MCV: 100.3 fL — ABNORMAL HIGH (ref 80.0–100.0)
Monocytes Absolute: 0.7 10*3/uL (ref 0.1–1.0)
Monocytes Relative: 12 %
Neutro Abs: 4.1 10*3/uL (ref 1.7–7.7)
Neutrophils Relative %: 72 %
Platelet Count: 264 10*3/uL (ref 150–400)
RBC: 3.06 MIL/uL — ABNORMAL LOW (ref 4.22–5.81)
RDW: 22.9 % — ABNORMAL HIGH (ref 11.5–15.5)
WBC Count: 5.7 10*3/uL (ref 4.0–10.5)
nRBC: 0 % (ref 0.0–0.2)

## 2019-02-13 MED ORDER — SODIUM CHLORIDE 0.9% FLUSH
10.0000 mL | INTRAVENOUS | Status: DC | PRN
  Filled 2019-02-13: qty 10

## 2019-02-13 MED ORDER — POTASSIUM CHLORIDE CRYS ER 20 MEQ PO TBCR: 40.0000 meq | EXTENDED_RELEASE_TABLET | Freq: Every day | ORAL | 0 refills | Status: DC

## 2019-02-13 MED ORDER — DEXAMETHASONE SODIUM PHOSPHATE 10 MG/ML IJ SOLN
INTRAMUSCULAR | Status: AC
  Filled 2019-02-13: qty 1

## 2019-02-13 MED ORDER — HEPARIN SOD (PORK) LOCK FLUSH 100 UNIT/ML IV SOLN
500.0000 [IU] | Freq: Once | INTRAVENOUS | Status: DC | PRN
  Filled 2019-02-13: qty 5

## 2019-02-13 MED ORDER — DEXAMETHASONE SODIUM PHOSPHATE 10 MG/ML IJ SOLN
10.0000 mg | Freq: Once | INTRAMUSCULAR | Status: AC
  Administered 2019-02-13: 10 mg via INTRAVENOUS

## 2019-02-13 MED ORDER — ONDANSETRON HCL 8 MG PO TABS: 8.0000 mg | ORAL_TABLET | Freq: Three times a day (TID) | ORAL | 1 refills | Status: DC | PRN

## 2019-02-13 MED ORDER — SODIUM CHLORIDE 0.9 % IV SOLN
75.0000 mg/m2 | Freq: Once | INTRAVENOUS | Status: AC
  Administered 2019-02-13: 170 mg via INTRAVENOUS
  Filled 2019-02-13: qty 17

## 2019-02-13 MED ORDER — SODIUM CHLORIDE 0.9 % IV SOLN
Freq: Once | INTRAVENOUS | Status: AC
  Administered 2019-02-13: 10:00:00 via INTRAVENOUS
  Filled 2019-02-13: qty 250

## 2019-02-13 MED FILL — ONDANSETRON HCL 8 MG TABLET: 8 | 12 days supply | Qty: 40 | Fill #0

## 2019-02-13 NOTE — Patient Instructions (Signed)
[facility] Discharge Instructions for Patients Receiving Chemotherapy  Today you received the following chemotherapy agents: Taxotere  To help prevent nausea and vomiting after your treatment, we encourage you to take your nausea medication as directed.    If you develop nausea and vomiting that is not controlled by your nausea medication, call the clinic.   BELOW ARE SYMPTOMS THAT SHOULD BE REPORTED IMMEDIATELY:  *FEVER GREATER THAN 100.5 F  *CHILLS WITH OR WITHOUT FEVER  NAUSEA AND VOMITING THAT IS NOT CONTROLLED WITH YOUR NAUSEA MEDICATION  *UNUSUAL SHORTNESS OF BREATH  *UNUSUAL BRUISING OR BLEEDING  TENDERNESS IN MOUTH AND THROAT WITH OR WITHOUT PRESENCE OF ULCERS  *URINARY PROBLEMS  *BOWEL PROBLEMS  UNUSUAL RASH Items with * indicate a potential emergency and should be followed up as soon as possible.  Feel free to call the clinic should you have any questions or concerns. The clinic phone number is [phone].  Please show the CHEMO ALERT CARD at check-in to the Emergency Department and triage nurse.   

## 2019-02-13 NOTE — Progress Notes (Signed)
Hematology and Oncology Follow Up Visit  Roy Rosales [id number] 02/09/1966 53 y.o. 02/13/2019 9:30 AM Network, [employer], [facility]   Principle Diagnosis: 53 year old man with advanced prostate cancer with disease to the bone and lymphadenopathy diagnosed in 2019.  He was found to have castration-sensitive disease and Gleason score 5+4 = 9 at the time of diagnosis.   Prior Therapy:  He is status post prostate biopsy completed in February 2020.  Confirmed the presence of prostate adenocarcinoma Gleason score 5+4 = 9.  He is status post radiation therapy to the thoracic spine.  He completed 30 Gy in 10 fractions in March 2020.  Firmagon 240 mg given in February 2020.   Current therapy:  Taxotere chemotherapy 75 mg per metered square started on 01/02/2019.  He is here for cycle 3 of therapy.  Lupron 30 mg every 4 months started on 01/23/2019.  Interim History: Roy Rosales is here for a repeat evaluation.  Since the last visit, he reports no major changes in his health.  He has tolerated chemotherapy with few complaints including some mild nausea but no vomiting.  His appetite is improving although he has lost some weight.  He remains ambulatory without any recent falls or syncope.  His pain is manageable with oxycodone at this time.  He still able to drive and has reasonable quality of life.  He denied headaches, blurry vision, syncope or seizures.  Denies any fevers, chills or sweats.  Denied chest pain, palpitation, orthopnea or leg edema.  Denied cough, wheezing or hemoptysis.  Denied nausea, vomiting or abdominal pain.  Denies any constipation or diarrhea.  Denies any frequency urgency or hesitancy.  Denies any arthralgias or myalgias.  Denies any skin rashes or lesions.  Denies any bleeding or clotting tendency.  Denies any easy bruising.  Denies any hair or nail changes.  Denies any anxiety or depression.  Remaining review of system is  negative.      Medications: I have reviewed the patient's current medications.  Current Outpatient Medications  Medication Sig Dispense Refill  . albuterol (PROVENTIL HFA;VENTOLIN HFA) 108 (90 Base) MCG/ACT inhaler Inhale 2 puffs into the lungs every 4 (four) hours as needed. 1 Inhaler 2  . atenolol (TENORMIN) 100 MG tablet Take 1 tablet (100 mg total) by mouth daily. 30 tablet 6  . celecoxib (CELEBREX) 100 MG capsule Take 1 capsule (100 mg total) by mouth 2 (two) times daily. 60 capsule 3  . diazepam (DIASTAT ACUDIAL) 10 MG GEL Place 10 mg rectally once as needed for up to 1 dose for seizure. 1 Package 0  . doxycycline (VIBRA-TABS) 100 MG tablet Take 1 tablet (100 mg total) by mouth 2 (two) times daily. After eating to treat bronchitis 20 tablet 0  . gabapentin (NEURONTIN) 300 MG capsule Take 1 capsule (300 mg total) by mouth 3 (three) times daily. Start with 1 pill at bedtime for the first week then increase if tolerated 90 capsule 6  . levETIRAcetam (KEPPRA) 500 MG tablet Take 1 tablet (500 mg total) by mouth 2 (two) times daily. 60 tablet 11  . morphine (MS CONTIN) 60 MG 12 hr tablet Take 1 tablet (60 mg total) by mouth every 12 (twelve) hours. 60 tablet 0  . ondansetron (ZOFRAN) 8 MG tablet Take 1 tablet (8 mg total) by mouth every 8 (eight) hours as needed for nausea or vomiting (nausea). 40 tablet 1  . oxyCODONE-acetaminophen (PERCOCET) 10-325 MG tablet Take 1 tablet by mouth every 4 (four)  hours as needed for pain. 60 tablet 0  . polyethylene glycol powder (GLYCOLAX/MIRALAX) powder Take 17 g by mouth daily. Mixed with at least 8 ounces of water to treat constipation 3350 g 11  . potassium chloride SA (K-DUR,KLOR-CON) 20 MEQ tablet Take 2 tablets (40 mEq total) by mouth daily. 21 tablet 0  . prochlorperazine (COMPAZINE) 10 MG tablet Take 1 tablet (10 mg total) by mouth every 6 (six) hours as needed for nausea or vomiting. 30 tablet 0  . triamterene-hydrochlorothiazide (MAXZIDE) 75-50 MG  tablet Take 1 tablet by mouth daily. 30 tablet 6  . valACYclovir (VALTREX) 500 MG tablet Take 500 mg by mouth daily.     No current facility-administered medications for this visit.      Allergies: No Known Allergies  Past Medical History, Surgical history, Social history, and Family History were reviewed and updated.   Physical Exam: Blood pressure 120/72, pulse 83, temperature 98.5 F (36.9 C), temperature source Oral, resp. rate 18, height 6\' 1"  (1.854 m), weight 205 lb 9.6 oz (93.3 kg), SpO2 100 %.   ECOG: 1   General appearance: Alert, awake without any distress. Head: Atraumatic without abnormalities Oropharynx: Without any thrush or ulcers. Eyes: No scleral icterus. Lymph nodes: No lymphadenopathy noted in the cervical, supraclavicular, or axillary nodes Heart:regular rate and rhythm, without any murmurs or gallops.   Lung: Clear to auscultation without any rhonchi, wheezes or dullness to percussion. Abdomin: Soft, nontender without any shifting dullness or ascites. Musculoskeletal: No clubbing or cyanosis. Neurological: No motor or sensory deficits. Skin: No rashes or lesions.     Lab Results: Lab Results  Component Value Date   WBC 6.2 01/23/2019   HGB 9.5 (L) 01/23/2019   HCT 30.8 (L) 01/23/2019   MCV 97.8 01/23/2019   PLT 252 01/23/2019     Chemistry      Component Value Date/Time   NA 138 01/23/2019 0859   NA 134 11/20/2018 1138   K 2.7 (LL) 01/23/2019 0859   CL 101 01/23/2019 0859   CO2 26 01/23/2019 0859   BUN 9 01/23/2019 0859   BUN 11 11/20/2018 1138   CREATININE 0.84 01/23/2019 0859      Component Value Date/Time   CALCIUM 8.6 (L) 01/23/2019 0859   ALKPHOS 2,473 (H) 01/23/2019 0859   AST 18 01/23/2019 0859   ALT 11 01/23/2019 0859   BILITOT 0.5 01/23/2019 0859     R  Results for Roy Rosales, Roy Rosales (MRN [id number]) as of 02/13/2019 09:32  Ref. Range 01/01/2019 12:00 01/23/2019 08:59  Prostate Specific Ag, Serum Latest Ref Range: 0.0 - 4.0  ng/mL 9.0 (H) 8.2 (H)    Impression and Plan:  53 year old man with the following:  1.    Advanced prostate cancer with lymphadenopathy and bone disease that is currently castration-sensitive.   He has tolerated Taxotere chemotherapy without any major complications.  His PSA continues to show reasonable response at this time.  Risks and benefits of continuing this therapy long-term complications were discussed.  These issues include peripheral neuropathy, edema among others.  After discussion today is agreeable to proceed and the plan is to complete 6 cycles of therapy.  2.  IV access: He continues to tolerate chemotherapy via peripheral veins without any need for Port-A-Cath.  3.  Antiemetics: Antiemetics are available to him.  No issues reported with nausea or vomiting.  Zofran will be refilled for him.  4.  Androgen deprivation: He is currently on Lupron which he will receive every  4 months.  Next injection scheduled for July 2020.  5.  Compression fracture of his lumbar spine: Due to metastatic prostate cancer to the bone.  Status post radiation therapy.  6.  Pain: Manageable at this time with oxycodone.  7.  Bone directed therapy: I recommended calcium and vitamin D supplements and deferring Delton See he has a complete dental evaluation.  8.  Prognosis and goals of care: His disease is incurable but his performance status is adequate and aggressive therapy is warranted at this time.  9.  Follow-up: In 3 weeks for the next cycle of therapy.  25  minutes was spent with the patient face-to-face today.  More than 50% of time was dedicated to reviewing his disease status, treatment options and complications related therapy.    Zola Button, MD 4/16/20209:30 AM

## 2019-02-13 NOTE — Progress Notes (Signed)
Per Dr. Shadad, ok to treat with elevated Alk Phos and low potassium. No new orders. 

## 2019-02-13 NOTE — Addendum Note (Signed)
Addended by: Wyatt Portela on: 02/13/2019 09:52 AM   Modules accepted: Orders

## 2019-02-13 NOTE — Telephone Encounter (Signed)
Scheduled appt per 4/16 sch message.

## 2019-02-14 ENCOUNTER — Telehealth: Payer: Self-pay

## 2019-02-14 LAB — PROSTATE-SPECIFIC AG, SERUM (LABCORP): Prostate Specific Ag, Serum: 6.8 ng/mL — ABNORMAL HIGH (ref 0.0–4.0)

## 2019-02-14 NOTE — Telephone Encounter (Signed)
Contacted patient and left message with PSA results and to call back [phone] if any questions or concerns.

## 2019-02-14 NOTE — Telephone Encounter (Signed)
-----   Message from Wyatt Portela, MD sent at 02/14/2019  8:24 AM EDT ----- Please let him know his PSA is low.

## 2019-02-15 ENCOUNTER — Other Ambulatory Visit: Payer: Self-pay

## 2019-02-15 ENCOUNTER — Inpatient Hospital Stay: Payer: Medicaid Other

## 2019-02-15 VITALS — BP 125/82 | HR 80 | Temp 98.2°F | Resp 18

## 2019-02-15 DIAGNOSIS — C7951 Secondary malignant neoplasm of bone: Principal | ICD-10-CM

## 2019-02-15 DIAGNOSIS — C61 Malignant neoplasm of prostate: Secondary | ICD-10-CM

## 2019-02-15 DIAGNOSIS — Z5111 Encounter for antineoplastic chemotherapy: Secondary | ICD-10-CM | POA: Diagnosis not present

## 2019-02-15 MED ORDER — PEGFILGRASTIM INJECTION 6 MG/0.6ML ~~LOC~~
PREFILLED_SYRINGE | SUBCUTANEOUS | Status: AC
  Filled 2019-02-15: qty 0.6

## 2019-02-15 MED ORDER — PEGFILGRASTIM INJECTION 6 MG/0.6ML ~~LOC~~
6.0000 mg | PREFILLED_SYRINGE | Freq: Once | SUBCUTANEOUS | Status: AC
  Administered 2019-02-15: 10:00:00 6 mg via SUBCUTANEOUS

## 2019-02-15 NOTE — Patient Instructions (Signed)
Pegfilgrastim injection  What is this medicine?  PEGFILGRASTIM (PEG fil gra stim) is a long-acting granulocyte colony-stimulating factor that stimulates the growth of neutrophils, a type of white blood cell important in the body's fight against infection. It is used to reduce the incidence of fever and infection in patients with certain types of cancer who are receiving chemotherapy that affects the bone marrow, and to increase survival after being exposed to high doses of radiation.  This medicine may be used for other purposes; ask your health care provider or pharmacist if you have questions.  COMMON BRAND NAME(S): Fulphila, Neulasta, UDENYCA  What should I tell my health care provider before I take this medicine?  They need to know if you have any of these conditions:  -kidney disease  -latex allergy  -ongoing radiation therapy  -sickle cell disease  -skin reactions to acrylic adhesives (On-Body Injector only)  -an unusual or allergic reaction to pegfilgrastim, filgrastim, other medicines, foods, dyes, or preservatives  -pregnant or trying to get pregnant  -breast-feeding  How should I use this medicine?  This medicine is for injection under the skin. If you get this medicine at home, you will be taught how to prepare and give the pre-filled syringe or how to use the On-body Injector. Refer to the patient Instructions for Use for detailed instructions. Use exactly as directed. Tell your healthcare provider immediately if you suspect that the On-body Injector may not have performed as intended or if you suspect the use of the On-body Injector resulted in a missed or partial dose.  It is important that you put your used needles and syringes in a special sharps container. Do not put them in a trash can. If you do not have a sharps container, call your pharmacist or healthcare provider to get one.  Talk to your pediatrician regarding the use of this medicine in children. While this drug may be prescribed for  selected conditions, precautions do apply.  Overdosage: If you think you have taken too much of this medicine contact a poison control center or emergency room at once.  NOTE: This medicine is only for you. Do not share this medicine with others.  What if I miss a dose?  It is important not to miss your dose. Call your doctor or health care professional if you miss your dose. If you miss a dose due to an On-body Injector failure or leakage, a new dose should be administered as soon as possible using a single prefilled syringe for manual use.  What may interact with this medicine?  Interactions have not been studied.  Give your health care provider a list of all the medicines, herbs, non-prescription drugs, or dietary supplements you use. Also tell them if you smoke, drink alcohol, or use illegal drugs. Some items may interact with your medicine.  This list may not describe all possible interactions. Give your health care provider a list of all the medicines, herbs, non-prescription drugs, or dietary supplements you use. Also tell them if you smoke, drink alcohol, or use illegal drugs. Some items may interact with your medicine.  What should I watch for while using this medicine?  You may need blood work done while you are taking this medicine.  If you are going to need a MRI, CT scan, or other procedure, tell your doctor that you are using this medicine (On-Body Injector only).  What side effects may I notice from receiving this medicine?  Side effects that you should report to   your doctor or health care professional as soon as possible:  -allergic reactions like skin rash, itching or hives, swelling of the face, lips, or tongue  -back pain  -dizziness  -fever  -pain, redness, or irritation at site where injected  -pinpoint red spots on the skin  -red or dark-brown urine  -shortness of breath or breathing problems  -stomach or side pain, or pain at the shoulder  -swelling  -tiredness  -trouble passing urine or  change in the amount of urine  Side effects that usually do not require medical attention (report to your doctor or health care professional if they continue or are bothersome):  -bone pain  -muscle pain  This list may not describe all possible side effects. Call your doctor for medical advice about side effects. You may report side effects to FDA at 1-800-FDA-1088.  Where should I keep my medicine?  Keep out of the reach of children.  If you are using this medicine at home, you will be instructed on how to store it. Throw away any unused medicine after the expiration date on the label.  NOTE: This sheet is a summary. It may not cover all possible information. If you have questions about this medicine, talk to your doctor, pharmacist, or health care provider.   2019 Elsevier/Gold Standard (2018-01-21 16:57:08)

## 2019-02-18 ENCOUNTER — Encounter: Payer: Self-pay | Admitting: Pharmacy Technician

## 2019-02-18 NOTE — Progress Notes (Signed)
The patient is approved for drug assistance by [employer] for Neulasta OnPro. Enrollment is effective until 02/13/20 an is based on self pay. Drug replacement will begin on DOS 01/04/19.

## 2019-02-20 ENCOUNTER — Ambulatory Visit: Payer: Self-pay | Admitting: Urology

## 2019-02-27 ENCOUNTER — Other Ambulatory Visit: Payer: Self-pay | Admitting: Urology

## 2019-02-27 ENCOUNTER — Telehealth: Payer: Self-pay

## 2019-02-27 NOTE — Telephone Encounter (Signed)
Received message from patient requesting refill of morphine. Spoke with Sharee Pimple RN rad-onc with above refill request of morphine to be sent to [facility] as PA Ashlyn has been prescribing the morphine.

## 2019-02-28 ENCOUNTER — Other Ambulatory Visit: Payer: Self-pay | Admitting: Oncology

## 2019-02-28 ENCOUNTER — Telehealth: Payer: Self-pay

## 2019-02-28 MED ORDER — MORPHINE SULFATE ER 60 MG PO TBCR: 60.0000 mg | EXTENDED_RELEASE_TABLET | Freq: Two times a day (BID) | ORAL | 0 refills | Status: DC

## 2019-02-28 MED FILL — MORPHINE SULF 60 MG TAB SA: 60 | 30 days supply | Qty: 60 | Fill #0

## 2019-02-28 NOTE — Telephone Encounter (Signed)
Received call from patient stating that the morphine refill has not been sent to pharmacy and he is now out. Explained that the morphine is prescribed by Ashlyn PAS in rad-onc and this RN communicated request for refill to [employer] in rad-onc. Explained will follow up. Message left for Sam RN rad-onc with refill request and to follow up with patient.

## 2019-03-03 ENCOUNTER — Telehealth: Payer: Self-pay | Admitting: Neurology

## 2019-03-03 ENCOUNTER — Encounter: Payer: Self-pay | Admitting: Neurology

## 2019-03-03 ENCOUNTER — Other Ambulatory Visit: Payer: Self-pay | Admitting: Oncology

## 2019-03-03 NOTE — Telephone Encounter (Signed)
I called pt.  Chart updated.

## 2019-03-03 NOTE — Telephone Encounter (Signed)
Due to current COVID 19 pandemic, our office is severely reducing in office visits until further notice, in order to minimize the risk to our patients and healthcare providers.   Called patient to offer him a sooner appointment via virtual visit with Amy NP for Dr. Jaynee Eagles. Patient is due to follow-up this month as he was last seen in Nov 2019 and was given a 6 month follow-up. Patient was on Megan NP's schedule for July and has never seen NP before. Patient declined virtual visit as he does not have the resources to participate virtually, but accepted a telephone visit for 5/6 at 1:30. Patient understands that he will receive 3 phone calls from nurse, front office staff and finally NP. Patient was advised to be prepared for these calls.  Pt understands that although there may be some limitations with this type of visit, we will take all precautions to reduce any security or privacy concerns.  Pt understands that this will be treated like an in office visit and we will file with pt's insurance, and there may be a patient responsible charge related to this service.

## 2019-03-03 NOTE — Addendum Note (Signed)
Addended by: Brandon Melnick on: 03/03/2019 03:27 PM   Modules accepted: Orders

## 2019-03-04 NOTE — Progress Notes (Signed)
PATIENT: Roy Rosales DOB: Feb 03, 1966  REASON FOR VISIT: follow up HISTORY FROM: patient  Virtual Visit via Telephone Note  I connected with Frederico Hamman on 03/05/19 at  1:30 PM EDT by telephone and verified that I am speaking with the correct person using two identifiers.   I discussed the limitations, risks, security and privacy concerns of performing an evaluation and management service by telephone and the availability of in person appointments. I also discussed with the patient that there may be a patient responsible charge related to this service. The patient expressed understanding and agreed to proceed.   History of Present Illness:  03/05/19 Roy Rosales is a 53 y.o. male for follow up of seizures.  Mr. Baggerly reports that he is doing well on Keppra.  He does admit to taking only 1 dose per day.  He is unclear for how long.  He was unaware that he needed to take it twice a day.  He denies any seizure activity since last being seen in September 08, 2018.  He reports that he has quit drinking.  He is being seen by oncology for prostate cancer.  He reports that he has 3 chemo sessions left in his treatment plan.  He is doing well otherwise.   History (copied from Dr Cathren Laine note on 09/12/2018)  HPI:  Roy Rosales is a 53 y.o. male here as requested by the ED seizure.  Past medical history hypertension, alcoholism, COPD,  polysubstance abuse, prostate cancer.  Patient has been seen at the emergency room multiple times in September for back pain as well as tongue lacerations.  More recently this month for seizures. Back in September he woke up with a swollen tongue and pain and went to the ED diagnosed with a seizure. Recently in sleep he had GTCS and brought to the ED with postictal confusion. He drinks about 16oz bottles and he drinks 6 beers a day or maybe more and he mixes it with liquor. He also took pcp and hasn't used for 3 months. Discussed cessation but also  warned he can't just stop and discussed withdrawal and DTs and death. NO Fhx seizures, no prior seizures before September. He has poor care overall.No other focal neurologic deficits, associated symptoms, inciting events or modifiable factors.  Reviewed notes, labs and imaging from outside physicians, which showed:  CT head 2019 showed No acute intracranial abnormalities including mass lesion or mass effect, hydrocephalus, extra-axial fluid collection, midline shift, hemorrhage, or acute infarction, large ischemic events (personally reviewed images)  Reviewed notes from the ED.  Patient was seen in September of this year for back pain and arm pain and laceration of the tongue.  He woke up and felt like his tongue was swollen.  At that time he reported 10 beers daily.  Denied a history of alcohol withdrawal or alcohol withdrawal seizures.  Reports smoking "refer" but denies any other drug use.  At that time he denied any history of seizures.  Also medication noncompliance.  The ED suspected a seizure.  CK in the ED was 663 in the past was in the 3000s.  CT of the head was normal at that visit.  At that time patient reported he was still drinking and denied alcohol withdrawal.  In November this month earlier patient was brought in in respiratory distress.  He was awakened with a generalized tonic-clonic seizure in his sleep.  He was agitated and confused afterward.  His saturation was 79%.  He  was still confused in the ED could not remember how he got there or what happened.  He reported that he did drink less yesterday than usual.  Dr. Malen Gauze did recommend that he be started on antiepileptics.  Initiated Keppra.   Observations/Objective:  Generalized: Well developed, in no acute distress  Mentation: Alert oriented to time, place, history taking. Follows all commands speech and language fluent   Assessment and Plan:  53 y.o. year old male  has a past medical history of COPD (chronic obstructive  pulmonary disease) ([place]), Hypertension, Polysubstance abuse ([place]), and Prostate cancer ([place]).  with    ICD-10-CM   1. Seizure disorder [facility]) G40.909    Mr. Roy Rosales is doing well on Keppra 500 mg.  Unfortunately he is only been taking this once a day.  I have reeducated him on the importance of twice daily dosing.  I will send in a refill for Keppra 500 mg twice daily.  He was able to repeat instructions back to me.  I have congratulated him on alcohol cessation.  He was advised to continue following PCP and oncology very closely for routine maintenance.  I suggested a six-month follow-up.  He verbalizes understanding and agreement with this plan.  No orders of the defined types were placed in this encounter.   No orders of the defined types were placed in this encounter.    Follow Up Instructions:  I discussed the assessment and treatment plan with the patient. The patient was provided an opportunity to ask questions and all were answered. The patient agreed with the plan and demonstrated an understanding of the instructions.   The patient was advised to call back or seek an in-person evaluation if the symptoms worsen or if the condition fails to improve as anticipated.  I provided 25 minutes of non-face-to-face time during this encounter.  Mr. Roy Rosales is located at his visit residence during teleconference.  Video conference not an option due to lack of technology.  Provider is located at her place of residence.  Liane Comber, RN helped to facilitate visit.   Debbora Presto, NP

## 2019-03-05 ENCOUNTER — Ambulatory Visit (INDEPENDENT_AMBULATORY_CARE_PROVIDER_SITE_OTHER): Payer: Medicaid Other | Admitting: Family Medicine

## 2019-03-05 ENCOUNTER — Encounter: Payer: Self-pay | Admitting: Family Medicine

## 2019-03-05 ENCOUNTER — Other Ambulatory Visit: Payer: Self-pay

## 2019-03-05 DIAGNOSIS — G40909 Epilepsy, unspecified, not intractable, without status epilepticus: Secondary | ICD-10-CM | POA: Diagnosis not present

## 2019-03-05 NOTE — Progress Notes (Signed)
Made any corrections needed, and agree with history, physical, neuro exam,assessment and plan as stated.     Antonia Ahern, MD [facility]  

## 2019-03-06 ENCOUNTER — Telehealth: Payer: Self-pay | Admitting: Oncology

## 2019-03-06 ENCOUNTER — Inpatient Hospital Stay: Payer: Medicaid Other

## 2019-03-06 ENCOUNTER — Other Ambulatory Visit: Payer: Self-pay | Admitting: *Deleted

## 2019-03-06 ENCOUNTER — Inpatient Hospital Stay: Payer: Medicaid Other | Attending: Oncology | Admitting: Oncology

## 2019-03-06 ENCOUNTER — Other Ambulatory Visit: Payer: Self-pay

## 2019-03-06 VITALS — BP 118/69 | HR 88 | Temp 98.0°F | Resp 18 | Ht 73.0 in | Wt 200.4 lb

## 2019-03-06 DIAGNOSIS — R634 Abnormal weight loss: Secondary | ICD-10-CM | POA: Diagnosis not present

## 2019-03-06 DIAGNOSIS — R630 Anorexia: Secondary | ICD-10-CM | POA: Insufficient documentation

## 2019-03-06 DIAGNOSIS — Z5111 Encounter for antineoplastic chemotherapy: Secondary | ICD-10-CM | POA: Diagnosis present

## 2019-03-06 DIAGNOSIS — C61 Malignant neoplasm of prostate: Secondary | ICD-10-CM

## 2019-03-06 DIAGNOSIS — C7951 Secondary malignant neoplasm of bone: Secondary | ICD-10-CM | POA: Diagnosis not present

## 2019-03-06 DIAGNOSIS — M25551 Pain in right hip: Secondary | ICD-10-CM | POA: Diagnosis not present

## 2019-03-06 DIAGNOSIS — R5383 Other fatigue: Secondary | ICD-10-CM

## 2019-03-06 DIAGNOSIS — Z923 Personal history of irradiation: Secondary | ICD-10-CM | POA: Diagnosis not present

## 2019-03-06 DIAGNOSIS — Z7689 Persons encountering health services in other specified circumstances: Secondary | ICD-10-CM | POA: Diagnosis not present

## 2019-03-06 DIAGNOSIS — Z79818 Long term (current) use of other agents affecting estrogen receptors and estrogen levels: Secondary | ICD-10-CM | POA: Diagnosis not present

## 2019-03-06 DIAGNOSIS — R110 Nausea: Secondary | ICD-10-CM | POA: Diagnosis not present

## 2019-03-06 DIAGNOSIS — Z79899 Other long term (current) drug therapy: Secondary | ICD-10-CM | POA: Insufficient documentation

## 2019-03-06 DIAGNOSIS — R590 Localized enlarged lymph nodes: Secondary | ICD-10-CM | POA: Diagnosis not present

## 2019-03-06 DIAGNOSIS — Z791 Long term (current) use of non-steroidal anti-inflammatories (NSAID): Secondary | ICD-10-CM | POA: Insufficient documentation

## 2019-03-06 LAB — CBC WITH DIFFERENTIAL (CANCER CENTER ONLY)
Abs Immature Granulocytes: 0.05 10*3/uL (ref 0.00–0.07)
Basophils Absolute: 0 10*3/uL (ref 0.0–0.1)
Basophils Relative: 1 %
Eosinophils Absolute: 0 10*3/uL (ref 0.0–0.5)
Eosinophils Relative: 0 %
HCT: 28.9 % — ABNORMAL LOW (ref 39.0–52.0)
Hemoglobin: 8.8 g/dL — ABNORMAL LOW (ref 13.0–17.0)
Immature Granulocytes: 1 %
Lymphocytes Relative: 14 %
Lymphs Abs: 0.8 10*3/uL (ref 0.7–4.0)
MCH: 30.1 pg (ref 26.0–34.0)
MCHC: 30.4 g/dL (ref 30.0–36.0)
MCV: 99 fL (ref 80.0–100.0)
Monocytes Absolute: 0.9 10*3/uL (ref 0.1–1.0)
Monocytes Relative: 15 %
Neutro Abs: 4.4 10*3/uL (ref 1.7–7.7)
Neutrophils Relative %: 69 %
Platelet Count: 323 10*3/uL (ref 150–400)
RBC: 2.92 MIL/uL — ABNORMAL LOW (ref 4.22–5.81)
RDW: 22.3 % — ABNORMAL HIGH (ref 11.5–15.5)
WBC Count: 6.2 10*3/uL (ref 4.0–10.5)
nRBC: 1.3 % — ABNORMAL HIGH (ref 0.0–0.2)

## 2019-03-06 LAB — CMP (CANCER CENTER ONLY)
ALT: 11 U/L (ref 0–44)
AST: 20 U/L (ref 15–41)
Albumin: 3.3 g/dL — ABNORMAL LOW (ref 3.5–5.0)
Alkaline Phosphatase: 1089 U/L — ABNORMAL HIGH (ref 38–126)
Anion gap: 11 (ref 5–15)
BUN: 9 mg/dL (ref 6–20)
CO2: 23 mmol/L (ref 22–32)
Calcium: 9 mg/dL (ref 8.9–10.3)
Chloride: 105 mmol/L (ref 98–111)
Creatinine: 0.77 mg/dL (ref 0.61–1.24)
GFR, Est AFR Am: >60 mL/min (ref >60)
GFR, Estimated: >60 mL/min (ref >60)
Glucose, Bld: 122 mg/dL — ABNORMAL HIGH (ref 70–99)
Potassium: 3.3 mmol/L — ABNORMAL LOW (ref 3.5–5.1)
Sodium: 139 mmol/L (ref 135–145)
Total Bilirubin: 0.4 mg/dL (ref 0.3–1.2)
Total Protein: 7 g/dL (ref 6.5–8.1)

## 2019-03-06 MED ORDER — POTASSIUM CHLORIDE CRYS ER 20 MEQ PO TBCR: 40.0000 meq | EXTENDED_RELEASE_TABLET | Freq: Every day | ORAL | 0 refills | Status: DC

## 2019-03-06 MED ORDER — PEGFILGRASTIM 6 MG/0.6ML ~~LOC~~ PSKT
6.0000 mg | PREFILLED_SYRINGE | Freq: Once | SUBCUTANEOUS | Status: AC
  Administered 2019-03-06: 12:00:00 6 mg via SUBCUTANEOUS

## 2019-03-06 MED ORDER — PEGFILGRASTIM 6 MG/0.6ML ~~LOC~~ PSKT
PREFILLED_SYRINGE | SUBCUTANEOUS | Status: AC
  Filled 2019-03-06: qty 0.6

## 2019-03-06 MED ORDER — SODIUM CHLORIDE 0.9 % IV SOLN
75.0000 mg/m2 | Freq: Once | INTRAVENOUS | Status: AC
  Administered 2019-03-06: 11:00:00 170 mg via INTRAVENOUS
  Filled 2019-03-06: qty 17

## 2019-03-06 MED ORDER — PROCHLORPERAZINE MALEATE 10 MG PO TABS: 10.0000 mg | ORAL_TABLET | Freq: Four times a day (QID) | ORAL | 0 refills | Status: DC | PRN

## 2019-03-06 MED ORDER — DEXAMETHASONE SODIUM PHOSPHATE 10 MG/ML IJ SOLN
10.0000 mg | Freq: Once | INTRAMUSCULAR | Status: AC
  Administered 2019-03-06: 10 mg via INTRAVENOUS

## 2019-03-06 MED ORDER — SODIUM CHLORIDE 0.9 % IV SOLN
Freq: Once | INTRAVENOUS | Status: AC
  Administered 2019-03-06: 10:00:00 via INTRAVENOUS
  Filled 2019-03-06: qty 250

## 2019-03-06 MED ORDER — DEXAMETHASONE SODIUM PHOSPHATE 10 MG/ML IJ SOLN
INTRAMUSCULAR | Status: AC
  Filled 2019-03-06: qty 1

## 2019-03-06 MED FILL — PROCHLORPERAZINE 10 MG TAB: 10 | 15 days supply | Qty: 60 | Fill #0

## 2019-03-06 MED FILL — POTASSIUM CHLORIDE CRYS ER: 20 | 11 days supply | Qty: 21 | Fill #0

## 2019-03-06 NOTE — Telephone Encounter (Signed)
Scheduled appt per 5/07 sch message - pt to get an updated schedule next visit.

## 2019-03-06 NOTE — Progress Notes (Signed)
Hematology and Oncology Follow Up Visit  Roy Rosales [id number] 23-Sep-1966 53 y.o. 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  His tumor is Gleason score 5+4 = 9    Prior Therapy:  He is status post prostate biopsy completed in February 2020.  Confirmed the presence of prostate adenocarcinoma Gleason score 5+4 = 9.  He is status post radiation therapy to the thoracic spine.  He completed 30 Gy in 10 fractions in March 2020.  Firmagon 240 mg given in February 2020.   Current therapy:  Taxotere chemotherapy 75 mg per metered square started on 01/02/2019.  He is here for cycle 4 of therapy.  Lupron 30 mg every 4 months started on 01/23/2019.  Interim History: Roy Rosales returns today for a follow-up.  Since the last visit, reports no major changes in his health.  He tolerates chemotherapy without any new side effects.  His appetite has improved and is eating well although he has lost some weight.  He does report some nausea which is manageable with Compazine and occasionally Zofran.  He denies any worsening neuropathy or recent fevers.  His pain is also manageable with the current pain regimen including long-acting insulin acting pain medication.  He denied headaches, blurry vision, syncope or seizures.  Denies any fevers, chills or sweats.  Denied chest pain, palpitation, orthopnea or leg edema.  Denied cough, wheezing or hemoptysis.  Denied nausea, vomiting or abdominal pain.  Denies any constipation or diarrhea.  Denies any frequency urgency or hesitancy.  Denies any arthralgias or myalgias.  Denies any skin rashes or lesions.  Denies any bleeding or clotting tendency.  Denies any easy bruising.  Denies any hair or nail changes.  Denies any anxiety or depression.  Remaining review of system is  negative.           Medications: I have reviewed the patient's current medications.  Current Outpatient Medications  Medication Sig Dispense Refill  . albuterol (PROVENTIL HFA;VENTOLIN HFA) 108 (90 Base) MCG/ACT inhaler Inhale 2 puffs into the lungs every 4 (four) hours as needed. (Patient not taking: Reported on 03/03/2019) 1 Inhaler 2  . atenolol (TENORMIN) 100 MG tablet Take 1 tablet (100 mg total) by mouth daily. 30 tablet 6  . diazepam (DIASTAT ACUDIAL) 10 MG GEL Place 10 mg rectally once as needed for up to 1 dose for seizure. (Patient not taking: Reported on 03/03/2019) 1 Package 0  . doxycycline (VIBRA-TABS) 100 MG tablet Take 1 tablet (100 mg total) by mouth 2 (two) times daily. After eating to treat bronchitis 20 tablet 0  . gabapentin (NEURONTIN) 300 MG capsule Take 1 capsule (300 mg total) by mouth 3 (three) times daily. Start with 1 pill at bedtime for the first week then increase if tolerated (Patient not taking: Reported on 03/03/2019) 90 capsule 6  . levETIRAcetam (KEPPRA) 500 MG tablet Take 1 tablet (500 mg total) by mouth 2 (two) times daily. 60 tablet 11  . morphine (MS CONTIN) 60 MG 12 hr tablet Take 1 tablet (60 mg total) by mouth every 12 (twelve) hours. (Patient taking differently: Take 60 mg by mouth every 12 (twelve) hours. Prn) 60 tablet 0  . ondansetron (ZOFRAN) 8 MG tablet Take 1 tablet (8 mg total) by mouth every 8 (eight) hours as needed for nausea or vomiting (nausea). 40 tablet 1  . oxyCODONE-acetaminophen (PERCOCET) 10-325 MG tablet Take 1  tablet by mouth every 4 (four) hours as needed for pain. 60 tablet 0  . polyethylene glycol powder (GLYCOLAX/MIRALAX) powder Take 17 g by mouth daily. Mixed with at least 8 ounces of water to treat constipation 3350 g 11  . potassium chloride SA (K-DUR,KLOR-CON) 20 MEQ tablet Take 2 tablets (40 mEq total) by mouth daily. 21 tablet 0  . prochlorperazine (COMPAZINE) 10 MG tablet Take 1 tablet (10 mg total) by mouth every 6 (six)  hours as needed for nausea or vomiting. (Patient not taking: Reported on 03/03/2019) 30 tablet 0  . triamterene-hydrochlorothiazide (MAXZIDE) 75-50 MG tablet Take 1 tablet by mouth daily. 30 tablet 6  . valACYclovir (VALTREX) 500 MG tablet Take 500 mg by mouth daily.     No current facility-administered medications for this visit.      Allergies: No Known Allergies  Past Medical History, Surgical history, Social history, and Family History were reviewed and updated.   Physical Exam: Blood pressure 118/69, pulse 88, temperature 98 F (36.7 C), temperature source Oral, resp. rate 18, height 6\' 1"  (1.854 m), weight 200 lb 6.4 oz (90.9 kg), SpO2 100 %.    ECOG: 1   General appearance: Comfortable appearing without any discomfort Head: Normocephalic without any trauma Oropharynx: Mucous membranes are moist and pink without any thrush or ulcers. Eyes: Pupils are equal and round reactive to light. Lymph nodes: No cervical, supraclavicular, inguinal or axillary lymphadenopathy.   Heart:regular rate and rhythm.  S1 and S2 without leg edema. Lung: Clear without any rhonchi or wheezes.  No dullness to percussion. Abdomin: Soft, nontender, nondistended with good bowel sounds.  No hepatosplenomegaly. Musculoskeletal: No joint deformity or effusion.  Full range of motion noted. Neurological: No deficits noted on motor, sensory and deep tendon reflex exam. Skin: No petechial rash or dryness.  Appeared moist.       Lab Results: Lab Results  Component Value Date   WBC 6.2 03/06/2019   HGB 8.8 (L) 03/06/2019   HCT 28.9 (L) 03/06/2019   MCV 99.0 03/06/2019   PLT 323 03/06/2019     Chemistry      Component Value Date/Time   NA 138 02/13/2019 0923   NA 134 11/20/2018 1138   K 3.0 (LL) 02/13/2019 0923   CL 104 02/13/2019 0923   CO2 23 02/13/2019 0923   BUN 8 02/13/2019 0923   BUN 11 11/20/2018 1138   CREATININE 0.82 02/13/2019 0923      Component Value Date/Time   CALCIUM 9.0  02/13/2019 0923   ALKPHOS 2,139 (H) 02/13/2019 0923   AST 21 02/13/2019 0923   ALT 13 02/13/2019 0923   BILITOT 0.4 02/13/2019 0923      Results for Roy Rosales, Roy Rosales (MRN [id number]) as of 03/06/2019 09:27  Ref. Range 11/21/2018 11:01 01/01/2019 12:00 01/23/2019 08:59 02/13/2019 09:23  Prostate Specific Ag, Serum Latest Ref Range: 0.0 - 4.0 ng/mL 335.0 (H) 9.0 (H) 8.2 (H) 6.8 (H)    Impression and Plan:  53 year old man with the following:  1.    Castration-sensitive prostate cancer with disease to the bone with lymphadenopathy.   He continues to receive Taxotere chemotherapy without any major complications.  Risks and benefits of continuing this treatment was discussed today and is agreeable to continue.  Plan is to complete 6 cycles of therapy.  He has experienced mild side effects including nausea and some fatigue but overall manageable side effects.  Alternative therapy for the future were also reviewed including if he develops castration-resistant disease.  His options would include [place], [facility] and [place].  2.  IV access: He continues to tolerate chemotherapy infusion via peripheral veins without any issues.  3.  Antiemetics: He continues to use both Compazine and Zofran alternating.  Compazine was refilled for him today.  4.  Androgen deprivation: Long-term complications with androgen deprivation was reviewed today.  He will continues to receive Lupron every 4 months.  His last injection was on March 26.  5.  Compression fracture of his lumbar spine: Status post radiation therapy without any residual issues.  6.  Pain: Currently on MS Contin and Percocet which is available to him.  7.  Bone directed therapy: He is currently on calcium and vitamin D supplements and Delton See is deferred till dental evaluation is complete.  8.  Prognosis and goals of care: Therapy remains palliative although aggressive therapy is warranted given his excellent performance status.  9.   Follow-up: We will be in 3 weeks for cycle 5 of therapy.  25  minutes was spent with the patient face-to-face today.  More than 50% of time was spent on reviewing his laboratory data, disease status update and future treatment options.    Zola Button, MD 5/7/20209:26 AM

## 2019-03-06 NOTE — Patient Instructions (Signed)
[facility] Discharge Instructions for Patients Receiving Chemotherapy  Today you received the following chemotherapy agents:  Taxotere.  To help prevent nausea and vomiting after your treatment, we encourage you to take your nausea medication as directed.   If you develop nausea and vomiting that is not controlled by your nausea medication, call the clinic.   BELOW ARE SYMPTOMS THAT SHOULD BE REPORTED IMMEDIATELY:  *FEVER GREATER THAN 100.5 F  *CHILLS WITH OR WITHOUT FEVER  NAUSEA AND VOMITING THAT IS NOT CONTROLLED WITH YOUR NAUSEA MEDICATION  *UNUSUAL SHORTNESS OF BREATH  *UNUSUAL BRUISING OR BLEEDING  TENDERNESS IN MOUTH AND THROAT WITH OR WITHOUT PRESENCE OF ULCERS  *URINARY PROBLEMS  *BOWEL PROBLEMS  UNUSUAL RASH Items with * indicate a potential emergency and should be followed up as soon as possible.  Feel free to call the clinic should you have any questions or concerns. The clinic phone number is [phone].  Please show the [facility] at check-in to the Emergency Department and triage nurse.  Pegfilgrastim injection (Neulasta On Pro) What is this medicine? PEGFILGRASTIM (PEG fil gra stim) is a long-acting granulocyte colony-stimulating factor that stimulates the growth of neutrophils, a type of white blood cell important in the body's fight against infection. It is used to reduce the incidence of fever and infection in patients with certain types of cancer who are receiving chemotherapy that affects the bone marrow, and to increase survival after being exposed to high doses of radiation. This medicine may be used for other purposes; ask your health care provider or pharmacist if you have questions. COMMON BRAND NAME(S): Domenic Moras, UDENYCA What should I tell my health care provider before I take this medicine? They need to know if you have any of these conditions: -kidney disease -latex allergy -ongoing radiation therapy -sickle  cell disease -skin reactions to acrylic adhesives (On-Body Injector only) -an unusual or allergic reaction to pegfilgrastim, filgrastim, other medicines, foods, dyes, or preservatives -pregnant or trying to get pregnant -breast-feeding How should I use this medicine? This medicine is for injection under the skin. If you get this medicine at home, you will be taught how to prepare and give the pre-filled syringe or how to use the On-body Injector. Refer to the patient Instructions for Use for detailed instructions. Use exactly as directed. Tell your healthcare provider immediately if you suspect that the On-body Injector may not have performed as intended or if you suspect the use of the On-body Injector resulted in a missed or partial dose. It is important that you put your used needles and syringes in a special sharps container. Do not put them in a trash can. If you do not have a sharps container, call your pharmacist or healthcare provider to get one. Talk to your pediatrician regarding the use of this medicine in children. While this drug may be prescribed for selected conditions, precautions do apply. Overdosage: If you think you have taken too much of this medicine contact a poison control center or emergency room at once. NOTE: This medicine is only for you. Do not share this medicine with others. What if I miss a dose? It is important not to miss your dose. Call your doctor or health care professional if you miss your dose. If you miss a dose due to an On-body Injector failure or leakage, a new dose should be administered as soon as possible using a single prefilled syringe for manual use. What may interact with this medicine? Interactions have  not been studied. Give your health care provider a list of all the medicines, herbs, non-prescription drugs, or dietary supplements you use. Also tell them if you smoke, drink alcohol, or use illegal drugs. Some items may interact with your  medicine. This list may not describe all possible interactions. Give your health care provider a list of all the medicines, herbs, non-prescription drugs, or dietary supplements you use. Also tell them if you smoke, drink alcohol, or use illegal drugs. Some items may interact with your medicine. What should I watch for while using this medicine? You may need blood work done while you are taking this medicine. If you are going to need a MRI, CT scan, or other procedure, tell your doctor that you are using this medicine (On-Body Injector only). What side effects may I notice from receiving this medicine? Side effects that you should report to your doctor or health care professional as soon as possible: -allergic reactions like skin rash, itching or hives, swelling of the face, lips, or tongue -back pain -dizziness -fever -pain, redness, or irritation at site where injected -pinpoint red spots on the skin -red or dark-brown urine -shortness of breath or breathing problems -stomach or side pain, or pain at the shoulder -swelling -tiredness -trouble passing urine or change in the amount of urine Side effects that usually do not require medical attention (report to your doctor or health care professional if they continue or are bothersome): -bone pain -muscle pain This list may not describe all possible side effects. Call your doctor for medical advice about side effects. You may report side effects to FDA at 1-800-FDA-1088. Where should I keep my medicine? Keep out of the reach of children. If you are using this medicine at home, you will be instructed on how to store it. Throw away any unused medicine after the expiration date on the label. NOTE: This sheet is a summary. It may not cover all possible information. If you have questions about this medicine, talk to your doctor, pharmacist, or health care provider.  2019 Elsevier/Gold Standard (2018-01-21 16:57:08)

## 2019-03-06 NOTE — Progress Notes (Signed)
Dr. Alen Blew aware of patient's potassium level today.

## 2019-03-07 ENCOUNTER — Telehealth: Payer: Self-pay

## 2019-03-07 LAB — PROSTATE-SPECIFIC AG, SERUM (LABCORP): Prostate Specific Ag, Serum: 6 ng/mL — ABNORMAL HIGH (ref 0.0–4.0)

## 2019-03-07 NOTE — Telephone Encounter (Signed)
-----   Message from Wyatt Portela, MD sent at 03/07/2019  8:34 AM EDT ----- Please let him know his PSA is coming down.

## 2019-03-07 NOTE — Telephone Encounter (Signed)
Called patient and let him know is PSA is coming down. Gave patient lab result. Patient verbalized understanding.

## 2019-03-08 ENCOUNTER — Inpatient Hospital Stay: Payer: Medicaid Other

## 2019-03-14 ENCOUNTER — Other Ambulatory Visit: Payer: Self-pay | Admitting: Radiation Therapy

## 2019-03-14 DIAGNOSIS — C7951 Secondary malignant neoplasm of bone: Secondary | ICD-10-CM

## 2019-03-19 ENCOUNTER — Telehealth: Payer: Self-pay | Admitting: Radiation Therapy

## 2019-03-19 ENCOUNTER — Telehealth: Payer: Self-pay

## 2019-03-19 NOTE — Telephone Encounter (Signed)
Spoke with Roy Rosales about his upcomming T-spine MRI and follow-up in June with [facility]. He has written down the appointment information and plans to attend.   Mont Dutton R.T.(R)(T) Special Procedures Navigator

## 2019-03-19 NOTE — Telephone Encounter (Signed)
Follow up has been scheduled. Patient is aware of appt day and time.   

## 2019-03-20 ENCOUNTER — Other Ambulatory Visit: Payer: Self-pay | Admitting: Oncology

## 2019-03-20 MED ORDER — OXYCODONE-ACETAMINOPHEN 10-325 MG PO TABS: 1.0000 | ORAL_TABLET | ORAL | 0 refills | Status: DC | PRN

## 2019-03-20 MED FILL — OXYCODONE-APAP 10-325: 10-325 | 10 days supply | Qty: 60 | Fill #0

## 2019-03-21 ENCOUNTER — Ambulatory Visit: Payer: Medicaid Other | Attending: Family Medicine | Admitting: Family Medicine

## 2019-03-21 ENCOUNTER — Encounter: Payer: Self-pay | Admitting: Family Medicine

## 2019-03-21 ENCOUNTER — Other Ambulatory Visit: Payer: Self-pay

## 2019-03-21 DIAGNOSIS — C61 Malignant neoplasm of prostate: Secondary | ICD-10-CM | POA: Diagnosis not present

## 2019-03-21 DIAGNOSIS — C7951 Secondary malignant neoplasm of bone: Secondary | ICD-10-CM | POA: Diagnosis not present

## 2019-03-21 DIAGNOSIS — M25551 Pain in right hip: Secondary | ICD-10-CM | POA: Diagnosis not present

## 2019-03-21 MED ORDER — CELECOXIB 100 MG PO CAPS: 100.0000 mg | ORAL_CAPSULE | Freq: Two times a day (BID) | ORAL | 3 refills | Status: DC

## 2019-03-21 NOTE — Progress Notes (Signed)
Virtual Visit via Telephone Note  I connected with Roy Rosales on 03/21/19 at  2:10 PM EDT by telephone and verified that I am speaking with the correct person using two identifiers.   I discussed the limitations, risks, security and privacy concerns of performing an evaluation and management service by telephone and the availability of in person appointments. I also discussed with the patient that there may be a patient responsible charge related to this service. The patient expressed understanding and agreed to proceed.  Patient Location: Home Provider Location: Office Others participating in call: Call initiated by Emilio Aspen, RMA   History of Present Illness:       53 year old male with prostate cancer that has metastasized to bone who is currently undergoing chemotherapy at the cancer center who has complaint of acute onset of right-sided hip pain last night while he was lying down.  Patient states that he tried taking the morphine prescribed at the cancer center and this did not give any relief or decrease of his hip pain.  He states that he had some Celebrex 100 mg and took 2 pills x 1 which did decrease the pain from a 10 to about an 8.  Patient states that the pain is sharp and aching.  He reports that he is having difficulty walking due to the increased pain.  He is now also starting to get some pain in his left hip.  Patient would like to have a prescription for Celebrex to help with his hip pain.  He denies any fever or chills, no abdominal pain-no current nausea or vomiting.  He continues to have fatigue due to chemotherapy.  He denies any current increased peripheral edema.  No shortness of breath, no chest pain and no cough.  He continues to take his blood pressure medication and denies any headache or dizziness which he believes is related to his blood pressure  Past Medical History:  Diagnosis Date  . COPD (chronic obstructive pulmonary disease) ([place])   . Hypertension    . Polysubstance abuse ([place])   . Prostate cancer [facility])     Past Surgical History:  Procedure Laterality Date  . IR RADIOLOGIST EVAL & MGMT  01/15/2019  . NO PAST SURGERIES    . PROSTATE BIOPSY      Family History  Problem Relation Age of Onset  . Breast cancer Mother   . Hypertension Mother   . Cancer Sister   . Prostate cancer Father   . Prostate cancer Brother   . Stomach cancer Brother   . Seizures Neg Hx     Social History   Tobacco Use  . Smoking status: Current Every Day Smoker    Packs/day: 1.00    Types: Cigarettes  . Smokeless tobacco: Never Used  Substance Use Topics  . Alcohol use: Yes    Comment: 6-12+ beer/day; update 09/12/18 none since 09/07/18, plans to quit, was drinking 5-6 beers per day  . Drug use: Yes    Types: Marijuana, PCP    Comment: PCP for about 3 months, none since 2017 or 2018     No Known Allergies     Observations/Objective: No vital signs or physical exam conducted as visit was done via telephone due to current restrictions/limitations on in office visits due to the current COVID-19 pandemic  Assessment and Plan: 1. Acute right hip pain Patient with complaint of recent acute onset of right hip pain which was not relieved by his use of morphine that has  been prescribed by oncology for treatment of his bone pain secondary to metastatic prostate cancer.  Patient was encouraged to go to the emergency department for further evaluation should he have continued pain with inability to ambulate or if he has worsening of his current pain.  Patient requested Celebrex which he did recently take after onset of hip pain and patient states that the Celebrex did help.  Patient will be referred to orthopedics for further evaluation and treatment. - celecoxib (CELEBREX) 100 MG capsule; Take 1 capsule (100 mg total) by mouth 2 (two) times daily. Eat before medication  Dispense: 60 capsule; Refill: 3 - AMB referral to orthopedics  2. Prostate cancer metastatic  to bone [facility]) Patient's most recent oncology note from 03/06/2019 reviewed.  Patient does have prostate cancer with metastasis to the spine and he has completed radiation therapy to the thoracic spine in March 2020.  Patient has been prescribed morphine through the cancer center but states that this has not helped at all with his current hip pain.   - celecoxib (CELEBREX) 100 MG capsule; Take 1 capsule (100 mg total) by mouth 2 (two) times daily. Eat before medication  Dispense: 60 capsule; Refill: 3 - AMB referral to orthopedics  Follow Up Instructions:Return if symptoms worsen or fail to improve.    I discussed the assessment and treatment plan with the patient. The patient was provided an opportunity to ask questions and all were answered. The patient agreed with the plan and demonstrated an understanding of the instructions.   The patient was advised to call back or seek an in-person evaluation if the symptoms worsen or if the condition fails to improve as anticipated.  I provided 8  minutes of non-face-to-face time during this encounter.   [name], MD

## 2019-03-21 NOTE — Progress Notes (Signed)
HTN and pain in his right hip for the last night. Per pt it seems like it's moving to the left hip.   Per pt he is going through Chemo.  Med refills

## 2019-03-23 ENCOUNTER — Encounter: Payer: Self-pay | Admitting: Family Medicine

## 2019-03-25 ENCOUNTER — Ambulatory Visit: Payer: Medicaid Other | Admitting: Orthopaedic Surgery

## 2019-03-27 ENCOUNTER — Other Ambulatory Visit: Payer: Self-pay

## 2019-03-27 ENCOUNTER — Inpatient Hospital Stay: Payer: Medicaid Other

## 2019-03-27 ENCOUNTER — Inpatient Hospital Stay (HOSPITAL_BASED_OUTPATIENT_CLINIC_OR_DEPARTMENT_OTHER): Payer: Medicaid Other | Admitting: Oncology

## 2019-03-27 VITALS — BP 131/78 | HR 92 | Temp 98.3°F | Resp 18 | Wt 200.6 lb

## 2019-03-27 DIAGNOSIS — Z5111 Encounter for antineoplastic chemotherapy: Secondary | ICD-10-CM | POA: Diagnosis not present

## 2019-03-27 DIAGNOSIS — Z79818 Long term (current) use of other agents affecting estrogen receptors and estrogen levels: Secondary | ICD-10-CM

## 2019-03-27 DIAGNOSIS — C7951 Secondary malignant neoplasm of bone: Secondary | ICD-10-CM

## 2019-03-27 DIAGNOSIS — R634 Abnormal weight loss: Secondary | ICD-10-CM

## 2019-03-27 DIAGNOSIS — C61 Malignant neoplasm of prostate: Secondary | ICD-10-CM

## 2019-03-27 DIAGNOSIS — M25551 Pain in right hip: Secondary | ICD-10-CM

## 2019-03-27 DIAGNOSIS — Z923 Personal history of irradiation: Secondary | ICD-10-CM | POA: Diagnosis not present

## 2019-03-27 DIAGNOSIS — R630 Anorexia: Secondary | ICD-10-CM

## 2019-03-27 DIAGNOSIS — R590 Localized enlarged lymph nodes: Secondary | ICD-10-CM

## 2019-03-27 DIAGNOSIS — Z79899 Other long term (current) drug therapy: Secondary | ICD-10-CM

## 2019-03-27 DIAGNOSIS — Z791 Long term (current) use of non-steroidal anti-inflammatories (NSAID): Secondary | ICD-10-CM

## 2019-03-27 LAB — CBC WITH DIFFERENTIAL (CANCER CENTER ONLY)
Abs Immature Granulocytes: 0.04 10*3/uL (ref 0.00–0.07)
Basophils Absolute: 0 10*3/uL (ref 0.0–0.1)
Basophils Relative: 1 %
Eosinophils Absolute: 0.1 10*3/uL (ref 0.0–0.5)
Eosinophils Relative: 1 %
HCT: 31.4 % — ABNORMAL LOW (ref 39.0–52.0)
Hemoglobin: 9.4 g/dL — ABNORMAL LOW (ref 13.0–17.0)
Immature Granulocytes: 1 %
Lymphocytes Relative: 15 %
Lymphs Abs: 0.9 10*3/uL (ref 0.7–4.0)
MCH: 29.6 pg (ref 26.0–34.0)
MCHC: 29.9 g/dL — ABNORMAL LOW (ref 30.0–36.0)
MCV: 98.7 fL (ref 80.0–100.0)
Monocytes Absolute: 0.8 10*3/uL (ref 0.1–1.0)
Monocytes Relative: 13 %
Neutro Abs: 4.1 10*3/uL (ref 1.7–7.7)
Neutrophils Relative %: 69 %
Platelet Count: 280 10*3/uL (ref 150–400)
RBC: 3.18 MIL/uL — ABNORMAL LOW (ref 4.22–5.81)
RDW: 21.8 % — ABNORMAL HIGH (ref 11.5–15.5)
WBC Count: 5.8 10*3/uL (ref 4.0–10.5)
nRBC: 0 % (ref 0.0–0.2)

## 2019-03-27 LAB — CMP (CANCER CENTER ONLY)
ALT: 11 U/L (ref 0–44)
AST: 57 U/L — ABNORMAL HIGH (ref 15–41)
Albumin: 3.5 g/dL (ref 3.5–5.0)
Alkaline Phosphatase: 1220 U/L — ABNORMAL HIGH (ref 38–126)
Anion gap: 9 (ref 5–15)
BUN: 8 mg/dL (ref 6–20)
CO2: 24 mmol/L (ref 22–32)
Calcium: 9 mg/dL (ref 8.9–10.3)
Chloride: 104 mmol/L (ref 98–111)
Creatinine: 0.75 mg/dL (ref 0.61–1.24)
GFR, Est AFR Am: >60 mL/min (ref >60)
GFR, Estimated: >60 mL/min (ref >60)
Glucose, Bld: 110 mg/dL — ABNORMAL HIGH (ref 70–99)
Potassium: 3.3 mmol/L — ABNORMAL LOW (ref 3.5–5.1)
Sodium: 137 mmol/L (ref 135–145)
Total Bilirubin: 0.4 mg/dL (ref 0.3–1.2)
Total Protein: 6.8 g/dL (ref 6.5–8.1)

## 2019-03-27 MED ORDER — PEGFILGRASTIM 6 MG/0.6ML ~~LOC~~ PSKT
6.0000 mg | PREFILLED_SYRINGE | Freq: Once | SUBCUTANEOUS | Status: AC
  Administered 2019-03-27: 12:00:00 6 mg via SUBCUTANEOUS

## 2019-03-27 MED ORDER — PEGFILGRASTIM INJECTION 6 MG/0.6ML ~~LOC~~
PREFILLED_SYRINGE | SUBCUTANEOUS | Status: AC
  Filled 2019-03-27: qty 0.6

## 2019-03-27 MED ORDER — DEXAMETHASONE SODIUM PHOSPHATE 10 MG/ML IJ SOLN
10.0000 mg | Freq: Once | INTRAMUSCULAR | Status: AC
  Administered 2019-03-27: 10 mg via INTRAVENOUS

## 2019-03-27 MED ORDER — SODIUM CHLORIDE 0.9 % IV SOLN
Freq: Once | INTRAVENOUS | Status: AC
  Administered 2019-03-27: 10:00:00 via INTRAVENOUS
  Filled 2019-03-27: qty 250

## 2019-03-27 MED ORDER — SODIUM CHLORIDE 0.9 % IV SOLN
75.0000 mg/m2 | Freq: Once | INTRAVENOUS | Status: AC
  Administered 2019-03-27: 11:00:00 170 mg via INTRAVENOUS
  Filled 2019-03-27: qty 17

## 2019-03-27 MED ORDER — DEXAMETHASONE SODIUM PHOSPHATE 10 MG/ML IJ SOLN
INTRAMUSCULAR | Status: AC
  Filled 2019-03-27: qty 1

## 2019-03-27 NOTE — Progress Notes (Signed)
Hematology and Oncology Follow Up Visit  Roy Rosales [id number] [birth year] 53 y.o. 03/27/2019 8:44 AM Roy Rosales, MDNetwork, [facility]   Principle Diagnosis: 53 year old man with advanced prostate cancer with lymphadenopathy and bone disease presented with a Gleason score 5+4 = 9 and castration-sensitive disease in 2019.     Prior Therapy:  He is status post prostate biopsy completed in February 2020.  Confirmed the presence of prostate adenocarcinoma Gleason score 5+4 = 9.  He is status post radiation therapy to the thoracic spine.  He completed 30 Gy in 10 fractions in March 2020.  Firmagon 240 mg given in February 2020.   Current therapy:  Taxotere chemotherapy 75 mg per metered square started on 01/02/2019.  He is here for cycle 5 of therapy.  Lupron 30 mg every 4 months started on 01/23/2019.  Interim History: Roy Rosales is here for a follow-up.  Since the last visit, he continues to tolerate chemotherapy without any major issues.  He denies any nausea, fatigue or worsening neuropathy.  He does report some decrease in his appetite that resolves after 1 week.  He is ambulating without any major difficulties although in the last few days he started noticing increased right hip pain which at times deviates to the left side.  Still able to bear weight without any neurological deficits.  He denies any recent falls or syncope.  His quality of life and appetite remains about the same.  Patient denied any alteration mental status, neuropathy, confusion or dizziness.  Denies any night sweats, weight loss or changes in appetite.  Denied orthopnea, dyspnea on exertion or chest discomfort.  Denies shortness of breath, difficulty breathing hemoptysis or cough.  Denies any abdominal distention, nausea, early satiety or dyspepsia.  Denies any hematuria, frequency, dysuria or nocturia.  Denies any skin irritation, dryness or rash.  Denies any ecchymosis or petechiae.  Denies any  lymphadenopathy or clotting.  Denies any heat or cold intolerance.  Denies any anxiety or depression.  Remaining review of system is negative.     Medications: I have reviewed the patient's current medications.  Current Outpatient Medications  Medication Sig Dispense Refill  . albuterol (PROVENTIL HFA;VENTOLIN HFA) 108 (90 Base) MCG/ACT inhaler Inhale 2 puffs into the lungs every 4 (four) hours as needed. 1 Inhaler 2  . atenolol (TENORMIN) 100 MG tablet Take 1 tablet (100 mg total) by mouth daily. 30 tablet 6  . celecoxib (CELEBREX) 100 MG capsule Take 1 capsule (100 mg total) by mouth 2 (two) times daily. Eat before medication 60 capsule 3  . diazepam (DIASTAT ACUDIAL) 10 MG GEL Place 10 mg rectally once as needed for up to 1 dose for seizure. 1 Package 0  . doxycycline (VIBRA-TABS) 100 MG tablet Take 1 tablet (100 mg total) by mouth 2 (two) times daily. After eating to treat bronchitis 20 tablet 0  . gabapentin (NEURONTIN) 300 MG capsule Take 1 capsule (300 mg total) by mouth 3 (three) times daily. Start with 1 pill at bedtime for the first week then increase if tolerated (Patient not taking: Reported on 03/03/2019) 90 capsule 6  . levETIRAcetam (KEPPRA) 500 MG tablet Take 1 tablet (500 mg total) by mouth 2 (two) times daily. 60 tablet 11  . morphine (MS CONTIN) 60 MG 12 hr tablet Take 1 tablet (60 mg total) by mouth every 12 (twelve) hours. (Patient taking differently: Take 60 mg by mouth every 12 (twelve) hours. Prn) 60 tablet 0  . ondansetron (ZOFRAN) 8 MG tablet  Take 1 tablet (8 mg total) by mouth every 8 (eight) hours as needed for nausea or vomiting (nausea). 40 tablet 1  . oxyCODONE-acetaminophen (PERCOCET) 10-325 MG tablet Take 1 tablet by mouth every 4 (four) hours as needed for pain. 60 tablet 0  . polyethylene glycol powder (GLYCOLAX/MIRALAX) powder Take 17 g by mouth daily. Mixed with at least 8 ounces of water to treat constipation 3350 g 11  . potassium chloride SA (K-DUR) 20 MEQ  tablet Take 2 tablets (40 mEq total) by mouth daily. 21 tablet 0  . prochlorperazine (COMPAZINE) 10 MG tablet Take 1 tablet (10 mg total) by mouth every 6 (six) hours as needed for nausea or vomiting. 60 tablet 0  . triamterene-hydrochlorothiazide (MAXZIDE) 75-50 MG tablet Take 1 tablet by mouth daily. 30 tablet 6  . valACYclovir (VALTREX) 500 MG tablet Take 500 mg by mouth daily.     No current facility-administered medications for this visit.      Allergies: No Known Allergies  Past Medical History, Surgical history, Social history, and Family History were reviewed and updated.   Physical Exam:  Blood pressure 131/78, pulse 92, temperature 98.3 F (36.8 C), temperature source Oral, resp. rate 18, weight 200 lb 9 oz (91 kg), SpO2 98 %.    ECOG: 1   General appearance: Alert, awake without any distress. Head: Atraumatic without abnormalities Oropharynx: Without any thrush or ulcers. Eyes: No scleral icterus. Lymph nodes: No lymphadenopathy noted in the cervical, supraclavicular, or axillary nodes Heart:regular rate and rhythm, without any murmurs or gallops.   Lung: Clear to auscultation without any rhonchi, wheezes or dullness to percussion. Abdomin: Soft, nontender without any shifting dullness or ascites. Musculoskeletal: No clubbing or cyanosis. Neurological: No motor or sensory deficits. Skin: No rashes or lesions.      Lab Results: Lab Results  Component Value Date   WBC 6.2 03/06/2019   HGB 8.8 (L) 03/06/2019   HCT 28.9 (L) 03/06/2019   MCV 99.0 03/06/2019   PLT 323 03/06/2019     Chemistry      Component Value Date/Time   NA 139 03/06/2019 0900   NA 134 11/20/2018 1138   K 3.3 (L) 03/06/2019 0900   CL 105 03/06/2019 0900   CO2 23 03/06/2019 0900   BUN 9 03/06/2019 0900   BUN 11 11/20/2018 1138   CREATININE 0.77 03/06/2019 0900      Component Value Date/Time   CALCIUM 9.0 03/06/2019 0900   ALKPHOS 1,089 (H) 03/06/2019 0900   AST 20 03/06/2019  0900   ALT 11 03/06/2019 0900   BILITOT 0.4 03/06/2019 0900      Results for Roy Rosales (MRN [id number]) as of 03/27/2019 08:45  Ref. Range 02/13/2019 09:23 03/06/2019 09:00  Prostate Specific Ag, Serum Latest Ref Range: 0.0 - 4.0 ng/mL 6.8 (H) 6.0 (H)     Impression and Plan:  53 year old man with the following:  1.    Advanced prostate cancer with disease to the bone and lymphadenopathy that is currently castration-sensitive.   He has tolerated Taxotere chemotherapy without any major complications and his PSA continues to respond.  Risks and benefits of continuing 6 cycles of Taxotere chemotherapy were reviewed.  The plan is to complete 6 cycles of therapy and consider different treatment options if he developed castration resistant disease.  2.  IV access: We will continue to use peripheral veins at this time.  Port-A-Cath option will be deferred.  3.  Antiemetics: Antiemetics are available to him without  any issues.  4.  Androgen deprivation: He continues to receive Lupron every 4 months.  His next injection will be in August 2020.  Long-term complications associated with this therapy were reiterated including weight gain hot flashes.  5.    Thoracic spine metastasis: He is status post radiation in the past.  He does not report any recent issues or exacerbations.  6.  Pain: Currently on MS Contin and Percocet although he has not been taking morphine twice a day.  I recommended to continue to do so.  He has been experiencing more right-sided hip pain.  Will obtain MRI for further evaluation and consideration for palliative radiation therapy.  7.  Bone directed therapy: He is on calcium and vitamin D supplements with Xgeva deferred at this time.  8.  Prognosis and goals of care: His disease is incurable but aggressive therapy is warranted given his reasonable performance status.  9.  Follow-up: In 3 weeks for cycle 6 of therapy.  25  minutes was spent with the  patient face-to-face today.  More than 50% of time was dedicated to reviewing his disease status, laboratory data review, addressing complications related to his cancer and chemotherapy.    Zola Button, MD 5/28/20208:44 AM

## 2019-03-28 ENCOUNTER — Telehealth: Payer: Self-pay

## 2019-03-28 LAB — PROSTATE-SPECIFIC AG, SERUM (LABCORP): Prostate Specific Ag, Serum: 11.2 ng/mL — ABNORMAL HIGH (ref 0.0–4.0)

## 2019-03-28 NOTE — Telephone Encounter (Signed)
-----   Message from Wyatt Portela, MD sent at 03/28/2019  8:01 AM EDT ----- Please let him know his PSA is up. No changes for now.

## 2019-03-28 NOTE — Telephone Encounter (Signed)
Spoke to patient and made him aware that PSA is up from 6.0 to 11.2 and no changes for now per Dr. Alen Blew. Patient verbalized understanding.

## 2019-04-02 ENCOUNTER — Other Ambulatory Visit: Payer: Self-pay | Admitting: Oncology

## 2019-04-02 ENCOUNTER — Other Ambulatory Visit: Payer: Self-pay

## 2019-04-02 MED ORDER — OXYCODONE-ACETAMINOPHEN 10-325 MG PO TABS: 1.0000 | ORAL_TABLET | ORAL | 0 refills | Status: DC | PRN

## 2019-04-02 MED FILL — OXYCODONE-APAP 10-325: 10-325 | 10 days supply | Qty: 60 | Fill #0

## 2019-04-03 ENCOUNTER — Ambulatory Visit (HOSPITAL_COMMUNITY): Payer: Medicaid Other

## 2019-04-07 ENCOUNTER — Other Ambulatory Visit: Payer: Self-pay | Admitting: Radiation Therapy

## 2019-04-10 ENCOUNTER — Other Ambulatory Visit: Payer: Self-pay

## 2019-04-10 ENCOUNTER — Ambulatory Visit (HOSPITAL_COMMUNITY)
Admission: RE | Admit: 2019-04-10 | Discharge: 2019-04-10 | Disposition: A | Discharge status: Home / Self Care | Payer: Medicaid Other | Source: Ambulatory Visit | Attending: Radiation Oncology | Admitting: Radiation Oncology

## 2019-04-10 DIAGNOSIS — C7951 Secondary malignant neoplasm of bone: Secondary | ICD-10-CM

## 2019-04-10 MED ORDER — GADOBUTROL 1 MMOL/ML IV SOLN
9.0000 mL | Freq: Once | INTRAVENOUS | Status: AC | PRN
  Administered 2019-04-10: 9 mL via INTRAVENOUS

## 2019-04-10 MED FILL — ONDANSETRON HCL 8 MG TABLET: 8 | 12 days supply | Qty: 40 | Fill #1

## 2019-04-14 ENCOUNTER — Inpatient Hospital Stay: Payer: Medicaid Other | Attending: Oncology

## 2019-04-14 ENCOUNTER — Other Ambulatory Visit: Payer: Self-pay | Admitting: Oncology

## 2019-04-14 DIAGNOSIS — C61 Malignant neoplasm of prostate: Secondary | ICD-10-CM | POA: Insufficient documentation

## 2019-04-14 DIAGNOSIS — Z5189 Encounter for other specified aftercare: Secondary | ICD-10-CM | POA: Insufficient documentation

## 2019-04-14 DIAGNOSIS — Z79899 Other long term (current) drug therapy: Secondary | ICD-10-CM | POA: Insufficient documentation

## 2019-04-14 DIAGNOSIS — Z923 Personal history of irradiation: Secondary | ICD-10-CM | POA: Insufficient documentation

## 2019-04-14 DIAGNOSIS — Z5111 Encounter for antineoplastic chemotherapy: Secondary | ICD-10-CM | POA: Insufficient documentation

## 2019-04-14 DIAGNOSIS — Z791 Long term (current) use of non-steroidal anti-inflammatories (NSAID): Secondary | ICD-10-CM | POA: Insufficient documentation

## 2019-04-14 DIAGNOSIS — C7951 Secondary malignant neoplasm of bone: Secondary | ICD-10-CM | POA: Insufficient documentation

## 2019-04-14 MED ORDER — MORPHINE SULFATE ER 60 MG PO TBCR: 60.0000 mg | EXTENDED_RELEASE_TABLET | Freq: Two times a day (BID) | ORAL | 0 refills | Status: DC

## 2019-04-14 MED FILL — MORPHINE SULF 60 MG TAB SA: 60 | 30 days supply | Qty: 60 | Fill #0

## 2019-04-15 ENCOUNTER — Ambulatory Visit (INDEPENDENT_AMBULATORY_CARE_PROVIDER_SITE_OTHER): Payer: Medicaid Other

## 2019-04-15 ENCOUNTER — Ambulatory Visit: Payer: Self-pay

## 2019-04-15 ENCOUNTER — Encounter: Payer: Self-pay | Admitting: Orthopaedic Surgery

## 2019-04-15 ENCOUNTER — Ambulatory Visit: Payer: Medicaid Other | Admitting: Orthopaedic Surgery

## 2019-04-15 ENCOUNTER — Other Ambulatory Visit: Payer: Self-pay

## 2019-04-15 VITALS — BP 114/78 | HR 90 | Ht 73.0 in | Wt 200.0 lb

## 2019-04-15 DIAGNOSIS — M25561 Pain in right knee: Secondary | ICD-10-CM | POA: Diagnosis not present

## 2019-04-15 DIAGNOSIS — G8929 Other chronic pain: Secondary | ICD-10-CM

## 2019-04-15 DIAGNOSIS — M25562 Pain in left knee: Secondary | ICD-10-CM

## 2019-04-15 DIAGNOSIS — M5441 Lumbago with sciatica, right side: Secondary | ICD-10-CM

## 2019-04-15 DIAGNOSIS — M1711 Unilateral primary osteoarthritis, right knee: Secondary | ICD-10-CM | POA: Diagnosis not present

## 2019-04-15 DIAGNOSIS — C61 Malignant neoplasm of prostate: Secondary | ICD-10-CM

## 2019-04-15 DIAGNOSIS — M5442 Lumbago with sciatica, left side: Secondary | ICD-10-CM | POA: Diagnosis not present

## 2019-04-15 DIAGNOSIS — C7951 Secondary malignant neoplasm of bone: Secondary | ICD-10-CM | POA: Diagnosis not present

## 2019-04-15 MED ORDER — BUPIVACAINE HCL 0.25 % IJ SOLN
2.0000 mL | INTRAMUSCULAR | Status: AC | PRN
  Administered 2019-04-15: 2 mL via INTRA_ARTICULAR

## 2019-04-15 MED ORDER — METHYLPREDNISOLONE ACETATE 40 MG/ML IJ SUSP
80.0000 mg | INTRAMUSCULAR | Status: AC | PRN
  Administered 2019-04-15: 80 mg via INTRA_ARTICULAR

## 2019-04-15 MED ORDER — LIDOCAINE HCL 1 % IJ SOLN
2.0000 mL | INTRAMUSCULAR | Status: AC | PRN
  Administered 2019-04-15: 2 mL

## 2019-04-15 NOTE — Progress Notes (Addendum)
Office Visit Note   Patient: Roy Rosales           Date of Birth: Mar 19, 1966           MRN: [id number] Visit Date: 04/15/2019              Requested by: Roy Blackbird, MD [place],  [place] [address] PCP: Roy Blackbird, MD   Assessment & Plan: Visit Diagnoses:  1. Prostate cancer metastatic to bone ([place])   2. Chronic bilateral low back pain with bilateral sciatica   3. Unilateral primary osteoarthritis, right knee     Plan:  #1: We attempted to obtain consultation with Roy Rosales however the cancer center never would answer their phone and therefore we were unable to with him directly.  Therefore it is our intention to inject the right knee both as a diagnostic as well as therapeutic treatment for his pain.  He certainly has osteoarthritis in the knee but also looking at his bone scan certainly has metastatic disease down to the distal femur. #2: He will follow-up with [place] in 2 weeks and if this is beneficial we would inject left knee.  Follow-Up Instructions: Return in about 2 weeks (around 04/29/2019).   Face-to-face time spent with patient was greater than 45 minutes.  Greater than 50% of the time was spent in counseling and coordination of care as well as review of diagnostic evaluations.  Orders:  Orders Placed This Encounter  Procedures  . Large Joint Inj: R knee  . XR Pelvis 1-2 Views  . XR KNEE 3 VIEW LEFT  . XR KNEE 3 VIEW RIGHT   Meds ordered this encounter  Medications  . bupivacaine (MARCAINE) 0.25 % (with pres) injection 2 mL  . lidocaine (XYLOCAINE) 1 % (with pres) injection 2 mL  . methylPREDNISolone acetate (DEPO-MEDROL) injection 80 mg      Procedures: Large Joint Inj: R knee on 04/15/2019 3:00 PM Indications: pain and diagnostic evaluation Details: 25 G 1.5 in needle, anteromedial approach  Arthrogram: No  Medications: 2 mL lidocaine 1 %; 80 mg methylPREDNISolone acetate 40 MG/ML; 2 mL bupivacaine 0.25 % Procedure,  treatment alternatives, risks and benefits explained, specific risks discussed. Consent was given by the patient. Immediately prior to procedure a time out was called to verify the correct patient, procedure, equipment, support staff and site/side marked as required. Patient was prepped and draped in the usual sterile fashion.       Clinical Data: No additional findings.   Subjective: Chief Complaint  Patient presents with  . Right Hip - Pain  . Left Hip - Pain   HPI: Roy Rosales is a very pleasant 53 year old African-American male who has been referred by Dr. Antony Rosales from [facility] and [facility] for evaluation of bilateral hip pain.  He also is seen today complaining of bilateral knee pain right worse than left. He states that it starts in his lower back and radiates into his legs. The pain seems to switch back and forth between his legs. No numbness or tingling in his lower extremities. He does have weakness in both legs. He is taking oxycodone and morphine, but that is not helping his pain. He has had MRI scans of his thoracic and lumbar spine. He is currently taking chemo treatments for cancer in his T-Spine.  Today he is complaining more of right knee pain.   Review of Systems  Constitutional: Positive for fatigue.  HENT: Negative for  ear pain.   Eyes: Negative for pain.  Respiratory: Negative for shortness of breath.   Cardiovascular: Negative for leg swelling.  Gastrointestinal: Positive for constipation. Negative for diarrhea.  Endocrine: Negative for cold intolerance and heat intolerance.  Genitourinary: Positive for difficulty urinating.  Musculoskeletal: Negative for joint swelling.  Skin: Negative for rash.  Allergic/Immunologic: Negative for food allergies.  Neurological: Positive for weakness.  Hematological: Does not bruise/bleed easily.  Psychiatric/Behavioral: Negative for sleep disturbance.     Objective: Vital Signs: BP 114/78    Pulse 90   Ht 6\' 1"  (1.854 m)   Wt 200 lb (90.7 kg)   BMI 26.39 kg/m   Physical Exam Constitutional:      Appearance: He is well-developed.  Eyes:     Pupils: Pupils are equal, round, and reactive to light.  Pulmonary:     Effort: Pulmonary effort is normal.  Skin:    General: Skin is warm and dry.  Neurological:     Mental Status: He is alert and oriented to person, place, and time.  Psychiatric:        Behavior: Behavior normal.     Ortho Exam  Exam today reveals range of motion of the right knee from near full extension to 105 degrees.  He does have some pain referable to his knee from and internal and external rotation of the hip.  Does not really have much of an effusion in the knee.  No warmth or erythema.  Does have some tenderness to palpation along the joint lines.  Calf is supple.  Specialty Comments:  No specialty comments available.  Imaging: Xr Knee 3 View Left  Result Date: 04/15/2019 Three-phase x-ray of the left knee reveals some changes in the distal femur consistent with metastatic carcinoma as noted on bone scan.  Does have some mild peaking of the intercondylar spine.  Some calcification noted posteriorly which I believe is in the vascularity.  Xr Knee 3 View Right  Result Date: 04/15/2019 Three-view x-ray of the right knee reveals joint space narrowing of the medial compartment.  There is changes in the patella as well as the distal femur consistent with metastatic lesions.  Calcification is noted posteriorly distal femur joint line and proximal tib-fib area.  Xr Pelvis 1-2 Views  Result Date: 04/15/2019 1 view AP pelvis reveals marked changes of metastatic neoplasms diffusely throughout the entire pelvis and both hips and femurs.    PMFS History: Current Outpatient Medications  Medication Sig Dispense Refill  . albuterol (PROVENTIL HFA;VENTOLIN HFA) 108 (90 Base) MCG/ACT inhaler Inhale 2 puffs into the lungs every 4 (four) hours as needed. 1 Inhaler  2  . atenolol (TENORMIN) 100 MG tablet Take 1 tablet (100 mg total) by mouth daily. 30 tablet 6  . celecoxib (CELEBREX) 100 MG capsule Take 1 capsule (100 mg total) by mouth 2 (two) times daily. Eat before medication 60 capsule 3  . diazepam (DIASTAT ACUDIAL) 10 MG GEL Place 10 mg rectally once as needed for up to 1 dose for seizure. 1 Package 0  . doxycycline (VIBRA-TABS) 100 MG tablet Take 1 tablet (100 mg total) by mouth 2 (two) times daily. After eating to treat bronchitis 20 tablet 0  . gabapentin (NEURONTIN) 300 MG capsule Take 1 capsule (300 mg total) by mouth 3 (three) times daily. Start with 1 pill at bedtime for the first week then increase if tolerated 90 capsule 6  . levETIRAcetam (KEPPRA) 500 MG tablet Take 1 tablet (500 mg total) by  mouth 2 (two) times daily. 60 tablet 11  . morphine (MS CONTIN) 60 MG 12 hr tablet Take 1 tablet (60 mg total) by mouth every 12 (twelve) hours. 60 tablet 0  . ondansetron (ZOFRAN) 8 MG tablet Take 1 tablet (8 mg total) by mouth every 8 (eight) hours as needed for nausea or vomiting (nausea). 40 tablet 1  . oxyCODONE-acetaminophen (PERCOCET) 10-325 MG tablet Take 1 tablet by mouth every 4 (four) hours as needed for pain. 60 tablet 0  . polyethylene glycol powder (GLYCOLAX/MIRALAX) powder Take 17 g by mouth daily. Mixed with at least 8 ounces of water to treat constipation 3350 g 11  . potassium chloride SA (K-DUR) 20 MEQ tablet Take 2 tablets (40 mEq total) by mouth daily. 21 tablet 0  . prochlorperazine (COMPAZINE) 10 MG tablet Take 1 tablet (10 mg total) by mouth every 6 (six) hours as needed for nausea or vomiting. 60 tablet 0  . triamterene-hydrochlorothiazide (MAXZIDE) 75-50 MG tablet Take 1 tablet by mouth daily. 30 tablet 6  . valACYclovir (VALTREX) 500 MG tablet Take 500 mg by mouth daily.     No current facility-administered medications for this visit.     Patient Active Problem List   Diagnosis Date Noted  . Unilateral primary osteoarthritis,  right knee 04/15/2019  . Metastatic cancer ([place])   . Palliative care by specialist   . Cancer associated pain   . DNR (do not resuscitate)   . Goals of care, counseling/discussion 12/20/2018  . Prostate cancer metastatic to bone ([place]) 12/02/2018  . Urgency of urination 11/21/2018  . Elevated PSA 11/21/2018  . Elevated alkaline phosphatase level 11/21/2018  . COPD with chronic bronchitis ([place]) 11/20/2018  . Alcohol use 11/20/2018  . Tobacco use 11/20/2018  . Seizure disorder ([place]) 09/12/2018  . Polysubstance abuse ([place]) 09/12/2018  . Melena 03/08/2017  . Alcoholism 03/08/2017  . Transaminitis 03/08/2017  . Essential hypertension 03/08/2017   Past Medical History:  Diagnosis Date  . COPD (chronic obstructive pulmonary disease) ([place])   . Hypertension   . Polysubstance abuse ([place])   . Prostate cancer [facility])     Family History  Problem Relation Age of Onset  . Breast cancer Mother   . Hypertension Mother   . Cancer Sister   . Prostate cancer Father   . Prostate cancer Brother   . Stomach cancer Brother   . Seizures Neg Hx     Past Surgical History:  Procedure Laterality Date  . IR RADIOLOGIST EVAL & MGMT  01/15/2019  . NO PAST SURGERIES    . PROSTATE BIOPSY     Social History   Occupational History  . Occupation: concrete work    Comment: out of work since September 2019  Tobacco Use  . Smoking status: Current Every Day Smoker    Packs/day: 1.00    Types: Cigarettes  . Smokeless tobacco: Never Used  Substance and Sexual Activity  . Alcohol use: Yes    Comment: 6-12+ beer/day; update 09/12/18 none since 09/07/18, plans to quit, was drinking 5-6 beers per day  . Drug use: Yes    Types: Marijuana, PCP    Comment: PCP for about 3 months, none since 2017 or 2018  . Sexual activity: Not Currently

## 2019-04-16 ENCOUNTER — Ambulatory Visit
Admission: RE | Admit: 2019-04-16 | Discharge: 2019-04-16 | Disposition: A | Discharge status: Home / Self Care | Payer: No Typology Code available for payment source | Source: Ambulatory Visit | Attending: Urology | Admitting: Urology

## 2019-04-16 ENCOUNTER — Other Ambulatory Visit: Payer: Self-pay

## 2019-04-16 DIAGNOSIS — C7951 Secondary malignant neoplasm of bone: Secondary | ICD-10-CM | POA: Insufficient documentation

## 2019-04-16 DIAGNOSIS — C61 Malignant neoplasm of prostate: Secondary | ICD-10-CM
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
## 2019-04-17 ENCOUNTER — Inpatient Hospital Stay (HOSPITAL_BASED_OUTPATIENT_CLINIC_OR_DEPARTMENT_OTHER): Payer: Medicaid Other | Admitting: Oncology

## 2019-04-17 ENCOUNTER — Other Ambulatory Visit: Payer: Self-pay

## 2019-04-17 ENCOUNTER — Inpatient Hospital Stay: Payer: Medicaid Other

## 2019-04-17 VITALS — BP 116/72 | HR 88 | Temp 98.5°F | Resp 18 | Ht 73.0 in | Wt 191.8 lb

## 2019-04-17 DIAGNOSIS — Z923 Personal history of irradiation: Secondary | ICD-10-CM

## 2019-04-17 DIAGNOSIS — Z79899 Other long term (current) drug therapy: Secondary | ICD-10-CM

## 2019-04-17 DIAGNOSIS — C61 Malignant neoplasm of prostate: Secondary | ICD-10-CM

## 2019-04-17 DIAGNOSIS — C7951 Secondary malignant neoplasm of bone: Secondary | ICD-10-CM

## 2019-04-17 DIAGNOSIS — Z791 Long term (current) use of non-steroidal anti-inflammatories (NSAID): Secondary | ICD-10-CM | POA: Diagnosis not present

## 2019-04-17 DIAGNOSIS — Z5189 Encounter for other specified aftercare: Secondary | ICD-10-CM | POA: Diagnosis not present

## 2019-04-17 DIAGNOSIS — Z5111 Encounter for antineoplastic chemotherapy: Secondary | ICD-10-CM | POA: Diagnosis not present

## 2019-04-17 LAB — CBC WITH DIFFERENTIAL (CANCER CENTER ONLY)
Abs Immature Granulocytes: 0.06 10*3/uL (ref 0.00–0.07)
Basophils Absolute: 0 10*3/uL (ref 0.0–0.1)
Basophils Relative: 0 %
Eosinophils Absolute: 0.1 10*3/uL (ref 0.0–0.5)
Eosinophils Relative: 2 %
HCT: 30.3 % — ABNORMAL LOW (ref 39.0–52.0)
Hemoglobin: 9 g/dL — ABNORMAL LOW (ref 13.0–17.0)
Immature Granulocytes: 1 %
Lymphocytes Relative: 17 %
Lymphs Abs: 1.3 10*3/uL (ref 0.7–4.0)
MCH: 28.6 pg (ref 26.0–34.0)
MCHC: 29.7 g/dL — ABNORMAL LOW (ref 30.0–36.0)
MCV: 96.2 fL (ref 80.0–100.0)
Monocytes Absolute: 1.5 10*3/uL — ABNORMAL HIGH (ref 0.1–1.0)
Monocytes Relative: 20 %
Neutro Abs: 4.5 10*3/uL (ref 1.7–7.7)
Neutrophils Relative %: 60 %
Platelet Count: 371 10*3/uL (ref 150–400)
RBC: 3.15 MIL/uL — ABNORMAL LOW (ref 4.22–5.81)
RDW: 21.8 % — ABNORMAL HIGH (ref 11.5–15.5)
WBC Count: 7.5 10*3/uL (ref 4.0–10.5)
nRBC: 0.5 % — ABNORMAL HIGH (ref 0.0–0.2)

## 2019-04-17 LAB — CMP (CANCER CENTER ONLY)
ALT: 10 U/L (ref 0–44)
AST: 25 U/L (ref 15–41)
Albumin: 3.4 g/dL — ABNORMAL LOW (ref 3.5–5.0)
Alkaline Phosphatase: 1027 U/L — ABNORMAL HIGH (ref 38–126)
Anion gap: 9 (ref 5–15)
BUN: 12 mg/dL (ref 6–20)
CO2: 26 mmol/L (ref 22–32)
Calcium: 9.1 mg/dL (ref 8.9–10.3)
Chloride: 103 mmol/L (ref 98–111)
Creatinine: 0.75 mg/dL (ref 0.61–1.24)
GFR, Est AFR Am: >60 mL/min (ref >60)
GFR, Estimated: >60 mL/min (ref >60)
Glucose, Bld: 101 mg/dL — ABNORMAL HIGH (ref 70–99)
Potassium: 3.2 mmol/L — ABNORMAL LOW (ref 3.5–5.1)
Sodium: 138 mmol/L (ref 135–145)
Total Bilirubin: 0.5 mg/dL (ref 0.3–1.2)
Total Protein: 6.9 g/dL (ref 6.5–8.1)

## 2019-04-17 MED ORDER — OXYCODONE-ACETAMINOPHEN 10-325 MG PO TABS: 1.0000 | ORAL_TABLET | ORAL | 0 refills | Status: DC | PRN

## 2019-04-17 MED ORDER — DEXAMETHASONE SODIUM PHOSPHATE 10 MG/ML IJ SOLN
10.0000 mg | Freq: Once | INTRAMUSCULAR | Status: AC
  Administered 2019-04-17: 10 mg via INTRAVENOUS

## 2019-04-17 MED ORDER — SODIUM CHLORIDE 0.9 % IV SOLN
Freq: Once | INTRAVENOUS | Status: AC
  Administered 2019-04-17: 12:00:00 via INTRAVENOUS
  Filled 2019-04-17: qty 250

## 2019-04-17 MED ORDER — POTASSIUM CHLORIDE CRYS ER 20 MEQ PO TBCR: 40.0000 meq | EXTENDED_RELEASE_TABLET | Freq: Every day | ORAL | 0 refills | Status: DC

## 2019-04-17 MED ORDER — PEGFILGRASTIM 6 MG/0.6ML ~~LOC~~ PSKT
PREFILLED_SYRINGE | SUBCUTANEOUS | Status: AC
  Filled 2019-04-17: qty 0.6

## 2019-04-17 MED ORDER — PEGFILGRASTIM 6 MG/0.6ML ~~LOC~~ PSKT
6.0000 mg | PREFILLED_SYRINGE | Freq: Once | SUBCUTANEOUS | Status: AC
  Administered 2019-04-17: 6 mg via SUBCUTANEOUS

## 2019-04-17 MED ORDER — SODIUM CHLORIDE 0.9 % IV SOLN
75.0000 mg/m2 | Freq: Once | INTRAVENOUS | Status: AC
  Administered 2019-04-17: 170 mg via INTRAVENOUS
  Filled 2019-04-17: qty 17

## 2019-04-17 MED ORDER — DEXAMETHASONE SODIUM PHOSPHATE 10 MG/ML IJ SOLN
INTRAMUSCULAR | Status: AC
  Filled 2019-04-17: qty 1

## 2019-04-17 MED FILL — POTASSIUM CHLORIDE CRYS ER: 20 | 10 days supply | Qty: 21 | Fill #0

## 2019-04-17 MED FILL — OXYCODONE-APAP 10-325: 10-325 | 15 days supply | Qty: 60 | Fill #0

## 2019-04-17 NOTE — Patient Instructions (Signed)
[facility] Discharge Instructions for Patients Receiving Chemotherapy  Today you received the following chemotherapy agents: Docetaxel (Taxotere)  To help prevent nausea and vomiting after your treatment, we encourage you to take your nausea medication as directed.   If you develop nausea and vomiting that is not controlled by your nausea medication, call the clinic.   BELOW ARE SYMPTOMS THAT SHOULD BE REPORTED IMMEDIATELY:  *FEVER GREATER THAN 100.5 F  *CHILLS WITH OR WITHOUT FEVER  NAUSEA AND VOMITING THAT IS NOT CONTROLLED WITH YOUR NAUSEA MEDICATION  *UNUSUAL SHORTNESS OF BREATH  *UNUSUAL BRUISING OR BLEEDING  TENDERNESS IN MOUTH AND THROAT WITH OR WITHOUT PRESENCE OF ULCERS  *URINARY PROBLEMS  *BOWEL PROBLEMS  UNUSUAL RASH Items with * indicate a potential emergency and should be followed up as soon as possible.  Feel free to call the clinic should you have any questions or concerns. The clinic phone number is [phone].  Please show the [facility] at check-in to the Emergency Department and triage nurse.  Coronavirus (COVID-19) Are you at risk?  Are you at risk for the Coronavirus (COVID-19)?  To be considered HIGH RISK for Coronavirus (COVID-19), you have to meet the following criteria:  . Traveled to [place], [place], [place], [place] or [place]; or in the [place] to [place], [place], [place], or [place]; and have fever, cough, and shortness of breath within the last 2 weeks of travel OR . Been in close contact with a person diagnosed with COVID-19 within the last 2 weeks and have fever, cough, and shortness of breath . IF YOU DO NOT MEET THESE CRITERIA, YOU ARE CONSIDERED LOW RISK FOR COVID-19.  What to do if you are HIGH RISK for COVID-19?  Marland Kitchen If you are having a medical emergency, call 911. . Seek medical care right away. Before you go to a doctor's office, urgent care or emergency department, call ahead and tell them  about your recent travel, contact with someone diagnosed with COVID-19, and your symptoms. You should receive instructions from your physician's office regarding next steps of care.  . When you arrive at healthcare provider, tell the healthcare staff immediately you have returned from visiting [place], [place], [place], [place] or [place]; or traveled in the [place] to [place], [place], [place], or [place]; in the last two weeks or you have been in close contact with a person diagnosed with COVID-19 in the last 2 weeks.   . Tell the health care staff about your symptoms: fever, cough and shortness of breath. . After you have been seen by a medical provider, you will be either: o Tested for (COVID-19) and discharged home on quarantine except to seek medical care if symptoms worsen, and asked to  - Stay home and avoid contact with others until you get your results (4-5 days)  - Avoid travel on public transportation if possible (such as bus, train, or airplane) or o Sent to the Emergency Department by EMS for evaluation, COVID-19 testing, and possible admission depending on your condition and test results.  What to do if you are LOW RISK for COVID-19?  Reduce your risk of any infection by using the same precautions used for avoiding the common cold or flu:  Marland Kitchen Wash your hands often with soap and warm water for at least 20 seconds.  If soap and water are not readily available, use an alcohol-based hand sanitizer with at least 60% alcohol.  . If coughing or  sneezing, cover your mouth and nose by coughing or sneezing into the elbow areas of your shirt or coat, into a tissue or into your sleeve (not your hands). . Avoid shaking hands with others and consider head nods or verbal greetings only. . Avoid touching your eyes, nose, or mouth with unwashed hands.  . Avoid close contact with people who are sick. . Avoid places or events with large numbers of people in one location, like concerts or  sporting events. . Carefully consider travel plans you have or are making. . If you are planning any travel outside or inside the [place], visit the CDC's Travelers' Health webpage for the latest health notices. . If you have some symptoms but not all symptoms, continue to monitor at home and seek medical attention if your symptoms worsen. . If you are having a medical emergency, call 911.   [place] / e-Visit: [email or web address]         [facility]: [facility]: [phone]                   [facility]: [phone]

## 2019-04-17 NOTE — Progress Notes (Signed)
Hematology and Oncology Follow Up Visit  Roy Rosales [id number] May 23, 1966 53 y.o. 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  He was found to have Gleason score 5+4 = 9 and a PSA of 335.  Prior Therapy:  He is status post prostate biopsy completed in February 2020.  Confirmed the presence of prostate adenocarcinoma Gleason score 5+4 = 9.  He is status post radiation therapy to the thoracic spine.  He completed 30 Gy in 10 fractions in March 2020.  Firmagon 240 mg given in February 2020.   Current therapy:  Taxotere chemotherapy 75 mg per metered square started on 01/02/2019.  He completed 5 cycles of therapy and here for cycle 6.  Lupron 30 mg every 4 months started on 01/23/2019.  Interim History: Mr. Tinkham returns today for a repeat evaluation.  Since last visit, he reports few complications related to chemotherapy mostly increased fatigue and loss of appetite.  He continues to have chronic hip and knee pain and was evaluated by orthopedics and received an intra-articular injection for his knee.  He is ambulating with improvement at this time without any falls or syncope.  Continues to have hip pain and requires Percocet for breakthrough in addition to his morphine.   He denied headaches, blurry vision, syncope or seizures.  Denies any fevers, chills or sweats.  Denied chest pain, palpitation, orthopnea or leg edema.  Denied cough, wheezing or hemoptysis.  Denied nausea, vomiting or abdominal pain.  Denies any constipation or diarrhea.  Denies any frequency urgency or hesitancy.  Denies any arthralgias or myalgias.  Denies any skin rashes or lesions.  Denies any bleeding or clotting tendency.  Denies any easy bruising.  Denies any hair or nail changes.  Denies any anxiety or depression.  Remaining review of system is  negative.       Medications: I have reviewed the patient's current medications.  Current Outpatient Medications  Medication Sig Dispense Refill  . albuterol (PROVENTIL HFA;VENTOLIN HFA) 108 (90 Base) MCG/ACT inhaler Inhale 2 puffs into the lungs every 4 (four) hours as needed. 1 Inhaler 2  . atenolol (TENORMIN) 100 MG tablet Take 1 tablet (100 mg total) by mouth daily. 30 tablet 6  . celecoxib (CELEBREX) 100 MG capsule Take 1 capsule (100 mg total) by mouth 2 (two) times daily. Eat before medication 60 capsule 3  . diazepam (DIASTAT ACUDIAL) 10 MG GEL Place 10 mg rectally once as needed for up to 1 dose for seizure. 1 Package 0  . doxycycline (VIBRA-TABS) 100 MG tablet Take 1 tablet (100 mg total) by mouth 2 (two) times daily. After eating to treat bronchitis 20 tablet 0  . gabapentin (NEURONTIN) 300 MG capsule Take 1 capsule (300 mg total) by mouth 3 (three) times daily. Start with 1 pill at bedtime for the first week then increase if tolerated 90 capsule 6  . levETIRAcetam (KEPPRA) 500 MG tablet Take 1 tablet (500 mg total) by mouth 2 (two) times daily. 60 tablet 11  . morphine (MS CONTIN) 60 MG 12 hr tablet Take 1 tablet (60 mg total) by mouth every 12 (twelve) hours. 60 tablet 0  . ondansetron (ZOFRAN) 8 MG tablet Take 1 tablet (8 mg total) by mouth every 8 (eight) hours as needed for nausea or vomiting (nausea). 40 tablet 1  . oxyCODONE-acetaminophen (PERCOCET) 10-325 MG tablet Take 1 tablet  by mouth every 4 (four) hours as needed for pain. 60 tablet 0  . polyethylene glycol powder (GLYCOLAX/MIRALAX) powder Take 17 g by mouth daily. Mixed with at least 8 ounces of water to treat constipation 3350 g 11  . potassium chloride SA (K-DUR) 20 MEQ tablet Take 2 tablets (40 mEq total) by mouth daily. 21 tablet 0  . prochlorperazine (COMPAZINE) 10 MG tablet Take 1 tablet (10 mg total) by mouth every 6 (six) hours as needed for nausea or vomiting. 60 tablet 0  . triamterene-hydrochlorothiazide  (MAXZIDE) 75-50 MG tablet Take 1 tablet by mouth daily. 30 tablet 6  . valACYclovir (VALTREX) 500 MG tablet Take 500 mg by mouth daily.     No current facility-administered medications for this visit.      Allergies: No Known Allergies  Past Medical History, Surgical history, Social history, and Family History were reviewed and updated.   Physical Exam:   Blood pressure 116/72, pulse 88, temperature 98.5 F (36.9 C), temperature source Oral, resp. rate 18, height 6\' 1"  (1.854 m), weight 191 lb 12.8 oz (87 kg), SpO2 100 %.    ECOG: 1    General appearance: Comfortable appearing without any discomfort Head: Normocephalic without any trauma Oropharynx: Mucous membranes are moist and pink without any thrush or ulcers. Eyes: Pupils are equal and round reactive to light. Lymph nodes: No cervical, supraclavicular, inguinal or axillary lymphadenopathy.   Heart:regular rate and rhythm.  S1 and S2 without leg edema. Lung: Clear without any rhonchi or wheezes.  No dullness to percussion. Abdomin: Soft, nontender, nondistended with good bowel sounds.  No hepatosplenomegaly. Musculoskeletal: No joint deformity or effusion.  Full range of motion noted. Neurological: No deficits noted on motor, sensory and deep tendon reflex exam. Skin: No petechial rash or dryness.  Appeared moist.  Psychiatric: Mood and affect appeared appropriate.         Lab Results: Lab Results  Component Value Date   WBC 5.8 03/27/2019   HGB 9.4 (L) 03/27/2019   HCT 31.4 (L) 03/27/2019   MCV 98.7 03/27/2019   PLT 280 03/27/2019     Chemistry      Component Value Date/Time   NA 137 03/27/2019 0828   NA 134 11/20/2018 1138   K 3.3 (L) 03/27/2019 0828   CL 104 03/27/2019 0828   CO2 24 03/27/2019 0828   BUN 8 03/27/2019 0828   BUN 11 11/20/2018 1138   CREATININE 0.75 03/27/2019 0828      Component Value Date/Time   CALCIUM 9.0 03/27/2019 0828   ALKPHOS 1,220 (H) 03/27/2019 0828   AST 57 (H)  03/27/2019 0828   ALT 11 03/27/2019 0828   BILITOT 0.4 03/27/2019 0828      Results for TRISTIN, VANDEUSEN (MRN [id number]) as of 04/17/2019 09:08  Ref. Range 03/06/2019 09:00 03/27/2019 08:28  Prostate Specific Ag, Serum Latest Ref Range: 0.0 - 4.0 ng/mL 6.0 (H) 11.2 (H)      Impression and Plan:  53 year old man with the following:  1.    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   He is currently on Taxotere chemotherapy without any major complications.  Cycle 6 of therapy scheduled for today which will conclude this regimen.  Risks and benefits of proceeding today was discussed.  Additional therapy in case he developed castration resistant disease was discussed.  His PSA did go up slightly on Mar 27, 2019 which could be an indication that he might develop  castration-resistant disease rather quickly.  Medication such as Zytiga or Gillermina Phy would potentially be the next option.  He is agreeable to proceed at this time.  2.  IV access: No issues with using his peripheral veins at this time.  3.  Antiemetics: No nausea or vomiting reported at this time.  Antiemetics are available to him.  4.  Androgen deprivation: Next injection will be given in August 2020.  Long-term complications including nausea, fatigue and weight gain and sexual dysfunction were reviewed.  5.    Thoracic spine metastasis: No recent exacerbation and he is status post radiation therapy..  6.  Pain: He remains on morphine and Percocet for breakthrough purposes.  No adjustment in his pain medication at this time.  7.  Bone directed therapy: He is currently on calcium and vitamin D supplements.  8.  Prognosis and goals of care: Therapy remains palliative at this time although aggressive therapy is warranted given his excellent performance status.  9.  Follow-up: In 4 to 6 weeks for repeat evaluation.  25  minutes was spent with the patient  face-to-face today.  More than 50% of time was spent on reviewing his disease status, treatment options, complication liver therapy and answering questions regarding future plan of care.    Zola Button, MD 6/18/20209:08 AM

## 2019-04-17 NOTE — Addendum Note (Signed)
Addended by: Wyatt Portela on: 04/17/2019 02:27 PM   Modules accepted: Orders

## 2019-04-18 ENCOUNTER — Telehealth: Payer: Self-pay

## 2019-04-18 LAB — PROSTATE-SPECIFIC AG, SERUM (LABCORP): Prostate Specific Ag, Serum: 10.1 ng/mL — ABNORMAL HIGH (ref 0.0–4.0)

## 2019-04-18 NOTE — Telephone Encounter (Signed)
Contacted patient and made aware of results. 

## 2019-04-18 NOTE — Telephone Encounter (Signed)
-----   Message from Firas N Shadad, MD sent at 04/18/2019  8:06 AM EDT ----- Please let him know his PSA is down. 

## 2019-04-22 ENCOUNTER — Telehealth: Payer: Self-pay | Admitting: Oncology

## 2019-04-22 NOTE — Telephone Encounter (Signed)
Called and spoke with patient. Confirmed date and time of 7/29 appt. Different date and time than requested on los due to no availability for injections at that time

## 2019-04-28 ENCOUNTER — Telehealth: Payer: Self-pay | Admitting: *Deleted

## 2019-04-28 NOTE — Telephone Encounter (Signed)
Received call from patient requesting refill of his percocet 10/325.   This was last filled on 04/17/19 for # 3

## 2019-04-29 ENCOUNTER — Encounter: Payer: Self-pay | Admitting: Orthopaedic Surgery

## 2019-04-29 ENCOUNTER — Ambulatory Visit (INDEPENDENT_AMBULATORY_CARE_PROVIDER_SITE_OTHER): Payer: Medicaid Other

## 2019-04-29 ENCOUNTER — Other Ambulatory Visit: Payer: Self-pay

## 2019-04-29 ENCOUNTER — Other Ambulatory Visit: Payer: Self-pay | Admitting: Oncology

## 2019-04-29 ENCOUNTER — Ambulatory Visit (INDEPENDENT_AMBULATORY_CARE_PROVIDER_SITE_OTHER): Payer: Medicaid Other | Admitting: Orthopaedic Surgery

## 2019-04-29 ENCOUNTER — Other Ambulatory Visit: Payer: Self-pay | Admitting: *Deleted

## 2019-04-29 VITALS — BP 131/78 | HR 95 | Ht 73.0 in | Wt 191.0 lb

## 2019-04-29 DIAGNOSIS — M25511 Pain in right shoulder: Secondary | ICD-10-CM

## 2019-04-29 DIAGNOSIS — M25561 Pain in right knee: Secondary | ICD-10-CM | POA: Diagnosis not present

## 2019-04-29 DIAGNOSIS — G8929 Other chronic pain: Secondary | ICD-10-CM | POA: Diagnosis not present

## 2019-04-29 DIAGNOSIS — M25562 Pain in left knee: Secondary | ICD-10-CM | POA: Diagnosis not present

## 2019-04-29 DIAGNOSIS — S43006A Unspecified dislocation of unspecified shoulder joint, initial encounter: Secondary | ICD-10-CM

## 2019-04-29 MED ORDER — LIDOCAINE HCL 1 % IJ SOLN
2.0000 mL | INTRAMUSCULAR | Status: AC | PRN
  Administered 2019-04-29: 14:00:00 2 mL

## 2019-04-29 MED ORDER — OXYCODONE-ACETAMINOPHEN 10-325 MG PO TABS: 1.0000 | ORAL_TABLET | ORAL | 0 refills | Status: DC | PRN

## 2019-04-29 MED ORDER — BUPIVACAINE HCL 0.5 % IJ SOLN
2.0000 mL | INTRAMUSCULAR | Status: AC | PRN
  Administered 2019-04-29: 14:00:00 2 mL via INTRA_ARTICULAR

## 2019-04-29 MED ORDER — METHYLPREDNISOLONE ACETATE 40 MG/ML IJ SUSP
80.0000 mg | INTRAMUSCULAR | Status: AC | PRN
  Administered 2019-04-29: 80 mg via INTRA_ARTICULAR

## 2019-04-29 MED FILL — OXYCODONE-APAP 10-325: 10-325 | 10 days supply | Qty: 60 | Fill #0

## 2019-04-29 NOTE — Progress Notes (Signed)
Office Visit Note   Patient: Roy Rosales           Date of Birth: 02-Jan-1966           MRN: [id number] Visit Date: 04/29/2019              Requested by: [name], MD [place],  [place] [id number] PCP: [name], MD   Assessment & Plan: Visit Diagnoses:  1. Chronic right shoulder pain   2. Chronic pain of both knees     Plan: Successful cortisone injection right knee several weeks ago.  Will inject left knee and monitor response.  Response.  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 Has complained of right shoulder pain since a seizure in November 2019.  He had a possible subluxation or dislocation at the time.  I reviewed films of his right shoulder on the PACS system.  He does have a [facility] lesion.  He still has trouble raising his arm over his head.  He also has a number of blastic lesions in the humeral head and shaft consistent with his prostate cancer I think it is worth obtaining an MRI scan to further define the problem with his shoulder Follow-Up Instructions: No follow-ups on file.   Orders:  Orders Placed This Encounter  Procedures  . Large Joint Inj: L knee  . XR Shoulder Right   No orders of the defined types were placed in this encounter.     Procedures: Large Joint Inj: L knee on 04/29/2019 2:18 PM Indications: pain and diagnostic evaluation Details: 25 G 1.5 in needle, anteromedial approach  Arthrogram: No  Medications: 2 mL lidocaine 1 %; 2 mL bupivacaine 0.5 %; 80 mg methylPREDNISolone acetate 40 MG/ML Procedure, treatment alternatives, risks and benefits explained, specific risks discussed. Consent was given by the patient. Patient was prepped and draped in the usual sterile fashion.       Clinical Data: No additional findings.   Subjective: Chief Complaint  Patient presents with  . Left Knee - Follow-up  . Right Knee - Follow-up  Patient presents today for  follow up on his knees. He had a right knee cortisone injection two weeks ago. The injection helped. He is wanting to get a cortisone injection on the left side. He is taking oxycodone and morphine for pain. He is wanting to have his right shoulder checked. He said that it has been hurting for 8 months. It started when the EMS came to his house when he was having a seizure. His shoulder is painful if he is not taking his pain medicine.  Films on the PACS system from November 2019 demonstrate Hill-Sachs lesion of the right femoral head consistent with prior dislocation.  Mr. Slocumb has had a problem with inability to raise his right arm over his head since that time  HPI  Review of Systems   Objective: Vital Signs: BP 131/78   Pulse 95   Ht 6\' 1"  (1.854 m)   Wt 191 lb (86.6 kg)   BMI 25.20 kg/m   Physical Exam Constitutional:      Appearance: He is well-developed.  Eyes:     Pupils: Pupils are equal, round, and reactive to light.  Pulmonary:     Effort: Pulmonary effort is normal.  Skin:    General: Skin is warm and dry.  Neurological:     Mental Status: He is alert and oriented to  person, place, and time.  Psychiatric:        Behavior: Behavior normal.     Ortho Exam Mr. Turay looks much better this week he seemed very fatigued and tired last office visit.  He is walking without ambulatory aid.  No related pain into his right knee.  Left knee with some mild medial joint pain but no effusion.  Walks with a slow deliberate gait but without pain.  Right shoulder lacked about 40 degrees of full overhead passively.  I could abduct about 80 degrees.  Had pain with internal and external rotation with positive grinding.  I wonder if he does not have a rotator cuff tear SSEPs appears to be in a slightly inferior position consistent with possible long head biceps tendon tear.  Good grip and good release.  Some intrinsic atrophy of his right hand  Specialty Comments:  No specialty comments  available.  Imaging: No results found.   PMFS History: Patient Active Problem List   Diagnosis Date Noted  . Metastatic cancer to spine ([place]) 04/16/2019  . Unilateral primary osteoarthritis, right knee 04/15/2019  . Metastatic cancer ([place])   . Palliative care by specialist   . Cancer associated pain   . DNR (do not resuscitate)   . Goals of care, counseling/discussion 12/20/2018  . Prostate cancer metastatic to bone ([place]) 12/02/2018  . Urgency of urination 11/21/2018  . Elevated PSA 11/21/2018  . Elevated alkaline phosphatase level 11/21/2018  . COPD with chronic bronchitis ([place]) 11/20/2018  . Alcohol use 11/20/2018  . Tobacco use 11/20/2018  . Seizure disorder ([place]) 09/12/2018  . Polysubstance abuse ([place]) 09/12/2018  . Melena 03/08/2017  . Alcoholism 03/08/2017  . Transaminitis 03/08/2017  . Essential hypertension 03/08/2017   Past Medical History:  Diagnosis Date  . COPD (chronic obstructive pulmonary disease) ([place])   . Hypertension   . Polysubstance abuse ([place])   . Prostate cancer [facility])     Family History  Problem Relation Age of Onset  . Breast cancer Mother   . Hypertension Mother   . Cancer Sister   . Prostate cancer Father   . Prostate cancer Brother   . Stomach cancer Brother   . Seizures Neg Hx     Past Surgical History:  Procedure Laterality Date  . IR RADIOLOGIST EVAL & MGMT  01/15/2019  . NO PAST SURGERIES    . PROSTATE BIOPSY     Social History   Occupational History  . Occupation: concrete work    Comment: out of work since September 2019  Tobacco Use  . Smoking status: Current Every Day Smoker    Packs/day: 1.00    Types: Cigarettes  . Smokeless tobacco: Never Used  Substance and Sexual Activity  . Alcohol use: Yes    Comment: 6-12+ beer/day; update 09/12/18 none since 09/07/18, plans to quit, was drinking 5-6 beers per day  . Drug use: Yes    Types: Marijuana, PCP    Comment: PCP for about 3 months, none since 2017 or 2018  . Sexual  activity: Not Currently

## 2019-05-08 ENCOUNTER — Other Ambulatory Visit: Payer: Self-pay | Admitting: Oncology

## 2019-05-08 ENCOUNTER — Other Ambulatory Visit: Payer: Self-pay

## 2019-05-08 ENCOUNTER — Telehealth: Payer: Self-pay

## 2019-05-08 MED ORDER — OXYCODONE-ACETAMINOPHEN 10-325 MG PO TABS: 1.0000 | ORAL_TABLET | ORAL | 0 refills | Status: DC | PRN

## 2019-05-08 MED FILL — OXYCODONE-APAP 10-325: 10-325 | 10 days supply | Qty: 60 | Fill #0

## 2019-05-08 NOTE — Telephone Encounter (Signed)
Received call from the patient stating that he has not been able to urinate sine 6pm the previous day. He stated that he is drinking plenty of fluids and has tried multiple times to urinate but does not produce any urine. He denied any pain, distention, or pressure. Asked if he has a urologist or is a patient at [facility] and patient stated that he is not. Advised patient to go to the ED for further evaluation.

## 2019-05-13 ENCOUNTER — Ambulatory Visit: Payer: Medicaid Other | Admitting: Orthopaedic Surgery

## 2019-05-16 ENCOUNTER — Other Ambulatory Visit: Payer: Self-pay | Admitting: Oncology

## 2019-05-16 ENCOUNTER — Other Ambulatory Visit: Payer: Self-pay

## 2019-05-16 MED ORDER — PROCHLORPERAZINE MALEATE 10 MG PO TABS: 10.0000 mg | ORAL_TABLET | Freq: Four times a day (QID) | ORAL | 0 refills | Status: DC | PRN

## 2019-05-16 MED ORDER — MORPHINE SULFATE ER 60 MG PO TBCR: 60.0000 mg | EXTENDED_RELEASE_TABLET | Freq: Two times a day (BID) | ORAL | 0 refills | Status: DC

## 2019-05-16 MED FILL — PROCHLORPERAZINE 10 MG TAB: 10 | 60 days supply | Qty: 60 | Fill #0

## 2019-05-16 MED FILL — MORPHINE SULF 60 MG TAB SA: 60 | 30 days supply | Qty: 60 | Fill #0

## 2019-05-19 ENCOUNTER — Ambulatory Visit: Payer: Self-pay | Admitting: Adult Health

## 2019-05-19 ENCOUNTER — Other Ambulatory Visit: Payer: Self-pay | Admitting: Oncology

## 2019-05-19 MED ORDER — OXYCODONE-ACETAMINOPHEN 10-325 MG PO TABS: 1.0000 | ORAL_TABLET | ORAL | 0 refills | Status: DC | PRN

## 2019-05-19 MED FILL — OXYCODONE-APAP 10-325: 10-325 | 10 days supply | Qty: 60 | Fill #0

## 2019-05-20 ENCOUNTER — Ambulatory Visit: Payer: Self-pay | Admitting: Family Medicine

## 2019-05-25 ENCOUNTER — Other Ambulatory Visit: Payer: Medicaid Other

## 2019-05-26 ENCOUNTER — Emergency Department (HOSPITAL_COMMUNITY): Payer: Medicaid Other

## 2019-05-26 ENCOUNTER — Emergency Department (HOSPITAL_COMMUNITY)
Admission: EM | Admit: 2019-05-26 | Discharge: 2019-05-26 | Disposition: A | Discharge status: Home / Self Care | Payer: Medicaid Other | Attending: Emergency Medicine | Admitting: Emergency Medicine

## 2019-05-26 ENCOUNTER — Encounter (HOSPITAL_COMMUNITY): Payer: Self-pay | Admitting: Obstetrics and Gynecology

## 2019-05-26 DIAGNOSIS — M25551 Pain in right hip: Secondary | ICD-10-CM

## 2019-05-26 DIAGNOSIS — I10 Essential (primary) hypertension: Secondary | ICD-10-CM | POA: Insufficient documentation

## 2019-05-26 DIAGNOSIS — F17210 Nicotine dependence, cigarettes, uncomplicated: Secondary | ICD-10-CM | POA: Diagnosis not present

## 2019-05-26 DIAGNOSIS — J449 Chronic obstructive pulmonary disease, unspecified: Secondary | ICD-10-CM | POA: Insufficient documentation

## 2019-05-26 DIAGNOSIS — C7951 Secondary malignant neoplasm of bone: Secondary | ICD-10-CM | POA: Insufficient documentation

## 2019-05-26 DIAGNOSIS — G893 Neoplasm related pain (acute) (chronic): Secondary | ICD-10-CM | POA: Insufficient documentation

## 2019-05-26 DIAGNOSIS — Z79899 Other long term (current) drug therapy: Secondary | ICD-10-CM | POA: Insufficient documentation

## 2019-05-26 DIAGNOSIS — Z66 Do not resuscitate: Secondary | ICD-10-CM | POA: Insufficient documentation

## 2019-05-26 DIAGNOSIS — M25552 Pain in left hip: Secondary | ICD-10-CM

## 2019-05-26 DIAGNOSIS — C61 Malignant neoplasm of prostate: Secondary | ICD-10-CM

## 2019-05-26 LAB — BASIC METABOLIC PANEL
Anion gap: 13 (ref 5–15)
BUN: 13 mg/dL (ref 6–20)
CO2: 24 mmol/L (ref 22–32)
Calcium: 8.5 mg/dL — ABNORMAL LOW (ref 8.9–10.3)
Chloride: 95 mmol/L — ABNORMAL LOW (ref 98–111)
Creatinine, Ser: 0.53 mg/dL — ABNORMAL LOW (ref 0.61–1.24)
GFR calc Af Amer: >60 mL/min (ref >60)
GFR calc non Af Amer: >60 mL/min (ref >60)
Glucose, Bld: 92 mg/dL (ref 70–99)
Potassium: 3.1 mmol/L — ABNORMAL LOW (ref 3.5–5.1)
Sodium: 132 mmol/L — ABNORMAL LOW (ref 135–145)

## 2019-05-26 LAB — CBC WITH DIFFERENTIAL/PLATELET
Abs Immature Granulocytes: 0.05 10*3/uL (ref 0.00–0.07)
Basophils Absolute: 0 10*3/uL (ref 0.0–0.1)
Basophils Relative: 0 %
Eosinophils Absolute: 0.1 10*3/uL (ref 0.0–0.5)
Eosinophils Relative: 2 %
HCT: 25.9 % — ABNORMAL LOW (ref 39.0–52.0)
Hemoglobin: 7.6 g/dL — ABNORMAL LOW (ref 13.0–17.0)
Immature Granulocytes: 1 %
Lymphocytes Relative: 10 %
Lymphs Abs: 0.7 10*3/uL (ref 0.7–4.0)
MCH: 25.9 pg — ABNORMAL LOW (ref 26.0–34.0)
MCHC: 29.3 g/dL — ABNORMAL LOW (ref 30.0–36.0)
MCV: 88.1 fL (ref 80.0–100.0)
Monocytes Absolute: 0.7 10*3/uL (ref 0.1–1.0)
Monocytes Relative: 10 %
Neutro Abs: 5.8 10*3/uL (ref 1.7–7.7)
Neutrophils Relative %: 77 %
Platelets: 402 10*3/uL — ABNORMAL HIGH (ref 150–400)
RBC: 2.94 MIL/uL — ABNORMAL LOW (ref 4.22–5.81)
RDW: 23.4 % — ABNORMAL HIGH (ref 11.5–15.5)
WBC: 7.4 10*3/uL (ref 4.0–10.5)
nRBC: 0.9 % — ABNORMAL HIGH (ref 0.0–0.2)

## 2019-05-26 MED ORDER — MORPHINE SULFATE (PF) 4 MG/ML IV SOLN
8.0000 mg | Freq: Once | INTRAVENOUS | Status: AC
  Administered 2019-05-26: 09:00:00 8 mg via INTRAVENOUS
  Filled 2019-05-26: qty 2

## 2019-05-26 NOTE — Discharge Instructions (Signed)
Please go to your previously scheduled appointment with your oncologist.  If you develop fever, difficulty breathing, notice blood in stools, vomiting or other new concerns develop, please return to the emergency department for reassessment.  Recommend taking your previously prescribed pain medicine as needed for your joint pains.  Future Appointments  Date Time Provider [place]  05/28/2019 12:45 PM [facility] LAB 4 [facility] None  05/28/2019  1:15 PM Wyatt Portela, MD [facility] None  05/28/2019  1:45 PM [facility] [place] FLUSH [facility] None  09/05/2019 10:00 AM Lomax, Amy, NP [facility] None

## 2019-05-26 NOTE — ED Triage Notes (Signed)
Per PTAR: Pt is coming from Pt reports right hip pain. VSS. Pt reports the pain has been going on for a day and he ran out of his oxycodone that refilled 7 days ago. Pt is prescribed oxycodone q4h and reports he gets 60 pills every 10 days.  Pt has a hx of metastatic cancer

## 2019-05-26 NOTE — ED Provider Notes (Signed)
[facility] DEPT Provider Note   CSN: [id number] Arrival date & time: 05/26/19  3086     History   Chief Complaint Chief Complaint  Patient presents with  . Hip Pain    HPI Roy Rosales is a 53 y.o. male.Patient states that he deals with chronic bilateral hip pain, normally worse on right side.  However over the past week this has been acutely worsening.  States pain makes it more difficult to walk.  Currently 6-10 in severity, sharp, stabbing.  Nonradiating.  Worse on right side but also the left is currently hurting.  States due to the increased pain he has increased the quantity of Percocet he has been taking.  States he has been taking 6 to 8 tablets daily and has run out of his prescription for Percocet.  Patient has an appointment with his oncologist in 2 days.  Patient states had a bowel movement yesterday and has been taking as prescribed bowel regimen.  Says he has some difficulty with urination due to his prostate cancer, has been urinating regularly recently.  No associated numbness or focal weakness in his legs.  Patient also reports that he has had increased generalized fatigue, generalized weakness.  States he generally has poor appetite, this is worsened when his pain flares up.  Denies any abdominal pain, nausea or vomiting, chest pain, difficulty breathing or cough or fever.  Per chart review - history of metastatic prostate cancer, mets to bone, chronic hip pain, shoulder pain, follows with ortho, onc.  As prescribed MS Contin 60 mg twice daily, Percocet 10 mg every 4 hours as needed.       HPI  Past Medical History:  Diagnosis Date  . COPD (chronic obstructive pulmonary disease) ([place])   . Hypertension   . Polysubstance abuse ([place])   . Prostate cancer [facility])     Patient Active Problem List   Diagnosis Date Noted  . Metastatic cancer to spine ([place]) 04/16/2019  . Unilateral primary osteoarthritis, right knee 04/15/2019  .  Metastatic cancer ([place])   . Palliative care by specialist   . Cancer associated pain   . DNR (do not resuscitate)   . Goals of care, counseling/discussion 12/20/2018  . Prostate cancer metastatic to bone ([place]) 12/02/2018  . Urgency of urination 11/21/2018  . Elevated PSA 11/21/2018  . Elevated alkaline phosphatase level 11/21/2018  . 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 Alcohol use 11/20/2018  . Tobacco use 11/20/2018  . Seizure disorder ([place]) 09/12/2018  . Polysubstance abuse ([place]) 09/12/2018  . Melena 03/08/2017  . Alcoholism 03/08/2017  . Transaminitis 03/08/2017  . Essential hypertension 03/08/2017    Past Surgical History:  Procedure Laterality Date  . IR RADIOLOGIST EVAL & MGMT  01/15/2019  . NO PAST SURGERIES    . PROSTATE BIOPSY          Home Medications    Prior to Admission medications   Medication Sig Start Date End Date Taking? Authorizing Provider  albuterol (PROVENTIL HFA;VENTOLIN HFA) 108 (90 Base) MCG/ACT inhaler Inhale 2 puffs into the lungs every 4 (four) hours as needed. 11/20/18   Elsie Stain, MD  atenolol (TENORMIN) 100 MG tablet Take 1 tablet (100 mg total) by mouth daily. 11/20/18   Elsie Stain, MD  celecoxib (CELEBREX) 100 MG capsule Take 1 capsule (100 mg total) by mouth 2 (two) times daily. Eat before medication 03/21/19   Fulp, Cammie, MD  diazepam (DIASTAT ACUDIAL) 10 MG GEL Place  10 mg rectally once as needed for up to 1 dose for seizure. 09/07/18   Orpah Greek, MD  doxycycline (VIBRA-TABS) 100 MG tablet Take 1 tablet (100 mg total) by mouth 2 (two) times daily. After eating to treat bronchitis 01/01/19   Fulp, Cammie, MD  gabapentin (NEURONTIN) 300 MG capsule Take 1 capsule (300 mg total) by mouth 3 (three) times daily. Start with 1 pill at bedtime for the first week then increase if tolerated 01/01/19   Fulp, Cammie, MD  levETIRAcetam (KEPPRA) 500 MG tablet Take 1 tablet (500 mg total) by mouth 2 (two) times daily.  11/20/18   Elsie Stain, MD  morphine (MS CONTIN) 60 MG 12 hr tablet Take 1 tablet (60 mg total) by mouth every 12 (twelve) hours. 05/16/19   Wyatt Portela, MD  ondansetron (ZOFRAN) 8 MG tablet Take 1 tablet (8 mg total) by mouth every 8 (eight) hours as needed for nausea or vomiting (nausea). 02/13/19   Wyatt Portela, MD  oxyCODONE-acetaminophen (PERCOCET) 10-325 MG tablet Take 1 tablet by mouth every 4 (four) hours as needed for pain. 05/19/19   Wyatt Portela, MD  polyethylene glycol powder (GLYCOLAX/MIRALAX) powder Take 17 g by mouth daily. Mixed with at least 8 ounces of water to treat constipation 01/01/19   Fulp, Cammie, MD  potassium chloride SA (K-DUR) 20 MEQ tablet Take 2 tablets (40 mEq total) by mouth daily. 04/17/19   Wyatt Portela, MD  prochlorperazine (COMPAZINE) 10 MG tablet Take 1 tablet (10 mg total) by mouth every 6 (six) hours as needed for nausea or vomiting. 05/16/19   Wyatt Portela, MD  triamterene-hydrochlorothiazide (MAXZIDE) 75-50 MG tablet Take 1 tablet by mouth daily. 11/20/18   Elsie Stain, MD  valACYclovir (VALTREX) 500 MG tablet Take 500 mg by mouth daily. 09/19/18   [provider]    Family History Family History  Problem Relation Age of Onset  . Breast cancer Mother   . Hypertension Mother   . Cancer Sister   . Prostate cancer Father   . Prostate cancer Brother   . Stomach cancer Brother   . Seizures Neg Hx     Social History Social History   Tobacco Use  . Smoking status: Current Every Day Smoker    Packs/day: 1.00    Types: Cigarettes  . Smokeless tobacco: Never Used  Substance Use Topics  . Alcohol use: Yes    Comment: 6-12+ beer/day; update 09/12/18 none since 09/07/18, plans to quit, was drinking 5-6 beers per day  . Drug use: Yes    Types: Marijuana, PCP    Comment: PCP for about 3 months, none since 2017 or 2018     Allergies   Patient has no known allergies.   Review of Systems Review of Systems   Constitutional: Positive for fatigue. Negative for chills and fever.  HENT: Negative for ear pain and sore throat.   Eyes: Negative for pain and visual disturbance.  Respiratory: Negative for cough and shortness of breath.   Cardiovascular: Negative for chest pain and palpitations.  Gastrointestinal: Negative for abdominal pain and vomiting.  Genitourinary: Negative for dysuria and hematuria.  Musculoskeletal: Negative for back pain.       + joint pain  Skin: Negative for color change and rash.  Neurological: Negative for seizures and syncope.  All other systems reviewed and are negative.    Physical Exam Updated Vital Signs There were no vitals taken for this visit.  Physical  Exam Constitutional:      Comments: Chronically ill-appearing but no distress  HENT:     Head: Normocephalic and atraumatic.     Nose: Nose normal. No congestion.     Mouth/Throat:     Mouth: Mucous membranes are moist.     Pharynx: Oropharynx is clear.  Eyes:     Pupils: Pupils are equal, round, and reactive to light.  Cardiovascular:     Rate and Rhythm: Normal rate and regular rhythm.  Pulmonary:     Effort: Pulmonary effort is normal.     Breath sounds: Normal breath sounds.  Abdominal:     General: Bowel sounds are normal. There is no distension.     Palpations: Abdomen is soft.     Tenderness: There is no abdominal tenderness.  Musculoskeletal:     Comments: RLE: Mild tenderness palpation in hip, normal joint ROM, strength, sensation and distal pulses intact LLE: Mild tenderness palpation in hip, normal joint ROM, strength, sensation and distal pulses intact  Neurological:     Mental Status: He is alert.     Sensory: No sensory deficit.      ED Treatments / Results  Labs (all labs ordered are listed, but only abnormal results are displayed) Labs Reviewed - No data to display  EKG None  Radiology No results found.  Procedures Procedures (including critical care time)   Medications Ordered in ED Medications - No data to display   Initial Impression / Assessment and Plan / ED Course  I have reviewed the triage vital signs and the nursing notes.  Pertinent labs & imaging results that were available during my care of the patient were reviewed by me and considered in my medical decision making (see chart for details).  Clinical Course as of May 25 1734  Mon May 26, 2019  0836 Completed chart review - metastatic prostate ca   [RD]  0845 Reviewed PMP - [place] regular provider   [RD]  0900 Completed initial assessment   [RD]  [phone] Noted low hemoglobin - chronically low, but worse than normal  Hemoglobin(!): 7.6 [RD]  1010 Completed rectal exam - [place]azi RN chaperoned, brown stool, heme occult negative   [RD]    Clinical Course User Index [RD] Lucrezia Starch, MD       53 year old gentleman presents emerged department chief complaint bilateral hip pain.  History of chronic hip pain related to prostate metastases.  X-rays negative for acute fracture, suspect symptoms related to his bony metastasis and doubt new acute process.  Provided pain control while in the department..  Patient also reported some generalized weakness, check blood work which was significant for small drop in his hemoglobin from baseline.  Baseline is 9, down to 7.6 today.  Hemoccult negative, patient without any other focal symptoms.  Suspect this is related to chronic disease.  Patient has follow-up appointment with his oncologist in 2 days.  Discussed with patient and need for recheck and follow-up with primary doctor and oncologist.  Final Clinical Impressions(s) / ED Diagnoses   Final diagnoses:  Prostate cancer ([place])  Pain from bone metastases ([place])  Bilateral hip pain    ED Discharge Orders    None       Lucrezia Starch, MD 05/26/19 1737

## 2019-05-28 ENCOUNTER — Inpatient Hospital Stay: Payer: Medicaid Other

## 2019-05-28 ENCOUNTER — Telehealth: Payer: Self-pay | Admitting: Oncology

## 2019-05-28 ENCOUNTER — Other Ambulatory Visit: Payer: Self-pay

## 2019-05-28 ENCOUNTER — Inpatient Hospital Stay: Payer: Medicaid Other | Attending: Oncology | Admitting: Oncology

## 2019-05-28 VITALS — BP 121/75 | HR 88 | Temp 98.5°F | Resp 18 | Ht 73.0 in | Wt 184.1 lb

## 2019-05-28 DIAGNOSIS — C61 Malignant neoplasm of prostate: Secondary | ICD-10-CM | POA: Insufficient documentation

## 2019-05-28 DIAGNOSIS — Z79899 Other long term (current) drug therapy: Secondary | ICD-10-CM | POA: Insufficient documentation

## 2019-05-28 DIAGNOSIS — Z791 Long term (current) use of non-steroidal anti-inflammatories (NSAID): Secondary | ICD-10-CM | POA: Diagnosis not present

## 2019-05-28 DIAGNOSIS — C7951 Secondary malignant neoplasm of bone: Secondary | ICD-10-CM | POA: Insufficient documentation

## 2019-05-28 DIAGNOSIS — Z923 Personal history of irradiation: Secondary | ICD-10-CM | POA: Insufficient documentation

## 2019-05-28 DIAGNOSIS — C799 Secondary malignant neoplasm of unspecified site: Secondary | ICD-10-CM

## 2019-05-28 LAB — CMP (CANCER CENTER ONLY)
ALT: 15 U/L (ref 0–44)
AST: 73 U/L — ABNORMAL HIGH (ref 15–41)
Albumin: 2.8 g/dL — ABNORMAL LOW (ref 3.5–5.0)
Alkaline Phosphatase: 2157 U/L — ABNORMAL HIGH (ref 38–126)
Anion gap: 9 (ref 5–15)
BUN: 13 mg/dL (ref 6–20)
CO2: 27 mmol/L (ref 22–32)
Calcium: 9.2 mg/dL (ref 8.9–10.3)
Chloride: 98 mmol/L (ref 98–111)
Creatinine: 0.69 mg/dL (ref 0.61–1.24)
GFR, Est AFR Am: >60 mL/min (ref >60)
GFR, Estimated: >60 mL/min (ref >60)
Glucose, Bld: 120 mg/dL — ABNORMAL HIGH (ref 70–99)
Potassium: 4 mmol/L (ref 3.5–5.1)
Sodium: 134 mmol/L — ABNORMAL LOW (ref 135–145)
Total Bilirubin: 0.6 mg/dL (ref 0.3–1.2)
Total Protein: 7.2 g/dL (ref 6.5–8.1)

## 2019-05-28 LAB — CBC WITH DIFFERENTIAL (CANCER CENTER ONLY)
Abs Immature Granulocytes: 0.04 10*3/uL (ref 0.00–0.07)
Basophils Absolute: 0 10*3/uL (ref 0.0–0.1)
Basophils Relative: 0 %
Eosinophils Absolute: 0 10*3/uL (ref 0.0–0.5)
Eosinophils Relative: 0 %
HCT: 27.6 % — ABNORMAL LOW (ref 39.0–52.0)
Hemoglobin: 8 g/dL — ABNORMAL LOW (ref 13.0–17.0)
Immature Granulocytes: 1 %
Lymphocytes Relative: 9 %
Lymphs Abs: 0.7 10*3/uL (ref 0.7–4.0)
MCH: 24.8 pg — ABNORMAL LOW (ref 26.0–34.0)
MCHC: 29 g/dL — ABNORMAL LOW (ref 30.0–36.0)
MCV: 85.7 fL (ref 80.0–100.0)
Monocytes Absolute: 0.6 10*3/uL (ref 0.1–1.0)
Monocytes Relative: 8 %
Neutro Abs: 6.4 10*3/uL (ref 1.7–7.7)
Neutrophils Relative %: 82 %
Platelet Count: 413 10*3/uL — ABNORMAL HIGH (ref 150–400)
RBC: 3.22 MIL/uL — ABNORMAL LOW (ref 4.22–5.81)
RDW: 23.9 % — ABNORMAL HIGH (ref 11.5–15.5)
WBC Count: 7.9 10*3/uL (ref 4.0–10.5)
nRBC: 1.6 % — ABNORMAL HIGH (ref 0.0–0.2)

## 2019-05-28 MED ORDER — OXYCODONE-ACETAMINOPHEN 10-325 MG PO TABS: 1.0000 | ORAL_TABLET | ORAL | 0 refills | Status: DC | PRN

## 2019-05-28 MED ORDER — LEUPROLIDE ACETATE (4 MONTH) 30 MG IM KIT
30.0000 mg | PACK | Freq: Once | INTRAMUSCULAR | Status: AC
  Administered 2019-05-28: 30 mg via INTRAMUSCULAR
  Filled 2019-05-28: qty 30

## 2019-05-28 MED FILL — OXYCODONE-APAP 10-325: 10-325 | 10 days supply | Qty: 60 | Fill #0

## 2019-05-28 NOTE — Telephone Encounter (Signed)
Called and spoke with patient. Confirmed date and time  °

## 2019-05-28 NOTE — Patient Instructions (Signed)

## 2019-05-28 NOTE — Addendum Note (Signed)
Addended by: Wyatt Portela on: 05/28/2019 02:08 PM   Modules accepted: Orders
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
## 2019-05-29 ENCOUNTER — Telehealth: Payer: Self-pay | Admitting: Pharmacist

## 2019-05-29 ENCOUNTER — Other Ambulatory Visit: Payer: Self-pay | Admitting: Oncology

## 2019-05-29 LAB — PROSTATE-SPECIFIC AG, SERUM (LABCORP): Prostate Specific Ag, Serum: 22.5 ng/mL — ABNORMAL HIGH (ref 0.0–4.0)

## 2019-05-29 MED ORDER — XTANDI 40 MG PO CAPS: 160.0000 mg | ORAL_CAPSULE | Freq: Every day | ORAL | 0 refills | Status: DC

## 2019-05-29 NOTE — Telephone Encounter (Signed)
Oral Oncology Pharmacist Encounter  Received new prescription for Xtandi (enzalutamide) for the treatment of metastatic, castration-resistant prostate cancer in conjunction with androgen deprivation therapy (lueprolide injections), planned duration until disease progression or unacceptable toxicity.  Original diagnosis in 2019 Patient has recently received systemic therapy with docetaxel x 6 cycles (march - June 2020) Patient is now noted to have increased alkaline phosphatase and short PSA doubling time and is under evaluation to initiate additional antiandrogen blockage with Xtandi administered at 160 mg by mouth once daily.  Labs from 05/29/19 assessed, OK for Xtandi inititation. BPs in Epic reviewed, readings WNL, will continue to be monitored  Current medication list in Epic reviewed, no significant DDIs with Xtandi identified.  Noted patient with seizure disorder and currently on levatiracetam.  According to the [facility] trial (JAMA Oncol. 2018; 4(5): 702-706), that studied 98 men to assess potential risk of seizure in patients with at least one risk factor for seizures, it was found that patients were at no increased risk of seizure incidence with or without enzalutamide treatment.  MD has discussed risk of seizure with patient and I will also address this during initial counseling session, as well as review importance of taking levatiracetam as prescribed by neurologist..  Prescription has been e-scribed to the [facility] for benefits analysis and approval by MD.  [facility] will continue to follow for insurance authorization, copayment issues, initial counseling and start date.  Johny Drilling, PharmD, BCPS, BCOP  05/29/2019 12:07 PM Oral Oncology Clinic [phone]

## 2019-05-29 NOTE — Progress Notes (Signed)
Laboratory results from 05/28/2019 were reviewed today with the patient.  His alkaline phosphatase is increasing as well as PSA doubling rather rapidly.  He is developing castration-resistant disease and will require immediate treatment.  Options of therapy would include Xtandi, Zytiga versus other choices.  At this time opted to proceed with Xtandi 160 mg daily.  Complication associated with this therapy include fatigue, nausea and edema.  Rarely seizures have been documented as well.  After discussion today he is agreeable to proceed and we will proceed with process to obtain this medication to be started soon as possible.

## 2019-05-30 ENCOUNTER — Other Ambulatory Visit: Payer: Self-pay | Admitting: Oncology

## 2019-05-30 ENCOUNTER — Telehealth: Payer: Self-pay | Admitting: Pharmacist

## 2019-05-30 MED ORDER — ABIRATERONE ACETATE 250 MG PO TABS: 1000.0000 mg | ORAL_TABLET | Freq: Every day | ORAL | 0 refills | Status: DC

## 2019-05-30 MED ORDER — PREDNISONE 5 MG PO TABS: 5.0000 mg | ORAL_TABLET | Freq: Every day | ORAL | 3 refills | Status: AC

## 2019-05-30 MED FILL — predniSONE 5 MG TABS: 5 | 90 days supply | Qty: 90 | Fill #0

## 2019-05-30 MED FILL — ABIRATERONE ACETATE 250 MG: 250 | 30 days supply | Qty: 120 | Fill #0

## 2019-05-30 NOTE — Telephone Encounter (Signed)
Oral Oncology Pharmacist Encounter  Received new prescription for Zytiga (abiraterone) for the treatment of metastatic, castration-resistant prostate cancer in conjunction with prednisone and androgen deprivation therapy (leuprolide injections), planned duration until disease progression or unacceptable toxicity.  Patient was originally diagnosed in 2019 and is s/p 6 cycles of docetaxel from March 2020 - June 2020. Patient has a short PSA doubling rate within the past month as well as increasing alkaline phosphatase and will now be initiated on treatment with Zytiga in addition to his androgen deprivation therapy. Patient was originally screened for initiation with Xtandi (enzalutamide), but is being switched to [facility] due to concern for increased seizure risk in this patient given his PMH is significant for seizures.   Patient will take Zytiga 250 mg tablets, 4 tablets (1000 mg) by mouth daily on an empty stomach, at least 1 hour before and 2 hours after food.  Patient will take prednisone 5 mg by mouth daily with breakfast.   Labs from Epic on 05/28/19  assessed, patient OK for Zytiga initiation.  Noted patient with elevated AST of 73, compared to previous CMET. Patient has had similarly elevated AST in past on chart review on 11/20/18. We  will continue to monitor LFTs during treatment.   Vitals assessed and patient has stable blood pressure, which will be monitored throughout treatment with Zytiga.  Current medication list in Epic reviewed, no significant DDIs with Zytiga identified.  Prescription has been e-scribed to the [facility] for benefits analysis and approval.  Oral Oncology Clinic will continue to follow for insurance authorization, copayment issues, initial counseling and start date.  Leron Croak, PharmD PGY2 Oncology/Hematology Pharmacy Resident 05/30/2019 1:18 PM Oral Oncology Clinic [phone]

## 2019-05-30 NOTE — Telephone Encounter (Signed)
Oral Oncology Pharmacist Encounter  Xtandi referral has been cancelled. Patient will be prescribed Zytiga/prednisone instead due to pre-existing seizure risk.  Johny Drilling, PharmD, BCPS, BCOP  05/30/2019 2:56 PM Oral Oncology Clinic [phone]

## 2019-05-30 NOTE — Telephone Encounter (Signed)
Oral Chemotherapy Pharmacist Encounter  I spoke with patient for overview of: Zytiga for the treatment of metastatic, castration-resistant prostate cancer in conjunction with prednisone, planned duration until disease progression or unacceptable toxicity.   Counseled patient on administration, dosing, side effects, monitoring, drug-food interactions, safe handling, storage, and disposal.  Patient will take Zytiga 250mg  tablets, 4 tablets (1000mg ) by mouth once daily on an empty stomach, 1 hour before or 2 hours after a meal.  Patient states he will take his Zytiga first thing in the morning and will wait at least 1 hour before eating.  Patient will take prednisone 5mg  tablet, 1 tablet by mouth one daily with breakfast.  Zytiga start date: 05/31/19  Adverse effects include but are not limited to: peripheral edema, GI upset, hypertension, hot flashes, fatigue, and arthralgias.    Prednisone prescription has been sent to [facility] on 05/30/19. Patient will obtain prednisone and knows to start prednisone on the same day as Zytiga start.  Reviewed with patient importance of keeping a medication schedule and plan for any missed doses.  Medication reconciliation performed and medication/allergy list updated.  Insurance authorization for Fabio Asa has been obtained. Test claim at the pharmacy revealed copayment $3.00 for 1st fill of Zytiga. Patient stated he is going to pick up the Zytiga and prednisone from the [facility] on 05/30/19.   Patient informed the pharmacy will reach out 5-7 days prior to needing next fill of Zytiga to coordinate continued medication acquisition to prevent break in therapy.  All questions answered.  Mr. Heavner voiced understanding and appreciation.   Patient knows to call the office with questions or concerns.   Leron Croak, PharmD PGY2 Oncology/Hematology Pharmacy Resident 05/30/2019 2:15 PM Oral Oncology  Clinic [phone]

## 2019-06-02 NOTE — Telephone Encounter (Signed)
Oral Oncology Patient Advocate Encounter  Confirmed with [place] that [facility] and Prednisone was picked up on 05/30/19.   [place] Patient [place] Phone [phone] Fax [phone] 06/02/2019   9:25 AM

## 2019-06-04 ENCOUNTER — Telehealth: Payer: Self-pay

## 2019-06-04 ENCOUNTER — Other Ambulatory Visit: Payer: Self-pay | Admitting: Oncology

## 2019-06-04 MED ORDER — OXYCODONE-ACETAMINOPHEN 10-325 MG PO TABS: 1.0000 | ORAL_TABLET | ORAL | 0 refills | Status: DC | PRN

## 2019-06-04 MED FILL — OXYCODONE-APAP 10-325: 10-325 | 10 days supply | Qty: 60 | Fill #0

## 2019-06-04 NOTE — Telephone Encounter (Signed)
Received call from patient requesting refill for Percocet and morphine. Explained that a refill for the Percocet can be sent in but is too early for the morphine and will have to wait until next week for the morphine refill as it will not run out until 8/16. Patient stated that he had a fall in the tub on Sunday and has been in extra pain related to this therefor will run out of pain medications early. Explained that if he has a fall with injury then he needs to be seen by a provider before any other medications will be prescribed. Encouraged patient to follow up with his PCP regarding the fall. Patient verbalized understanding but stated that he did not need to be seen and will wait for his pain medications. No other questions or concerns at this time.

## 2019-06-11 ENCOUNTER — Other Ambulatory Visit: Payer: Self-pay | Admitting: Oncology

## 2019-06-11 MED ORDER — MORPHINE SULFATE ER 60 MG PO TBCR: 60.0000 mg | EXTENDED_RELEASE_TABLET | Freq: Two times a day (BID) | ORAL | 0 refills | Status: DC

## 2019-06-11 MED FILL — MORPHINE SULF 60 MG TAB SA: 60 | 30 days supply | Qty: 60 | Fill #0

## 2019-06-12 ENCOUNTER — Other Ambulatory Visit: Payer: Self-pay | Admitting: Oncology

## 2019-06-12 MED ORDER — OXYCODONE-ACETAMINOPHEN 10-325 MG PO TABS: 1.0000 | ORAL_TABLET | ORAL | 0 refills | Status: DC | PRN

## 2019-06-12 MED FILL — OXYCODONE-APAP 10-325: 10-325 | 20 days supply | Qty: 120 | Fill #0

## 2019-06-19 ENCOUNTER — Other Ambulatory Visit: Payer: Self-pay | Admitting: Oncology

## 2019-06-23 MED FILL — ABIRATERONE ACETATE 250 MG: 250 | 30 days supply | Qty: 120 | Fill #0

## 2019-06-25 ENCOUNTER — Telehealth: Payer: Self-pay

## 2019-06-25 NOTE — Telephone Encounter (Signed)
Patient called requesting pain medication refill. Explained that he should have enough Percocet until 9/2 and that it is too soon to refill. Patient stated that he is still having increased pain since his fall in the bathtub, taking more than prescribed pain medication, and still has not seen an MD for this incident and pain complaints. Explained that a refill can be sent in on Friday and that the patient needs to follow up with Dr. Alen Blew regarding his pain complaints on his next scheduled appt on 9/3. Patient verbalized understanding and had no other questions or concerns.

## 2019-06-27 ENCOUNTER — Other Ambulatory Visit: Payer: Self-pay | Admitting: Oncology

## 2019-06-27 ENCOUNTER — Telehealth: Payer: Self-pay

## 2019-06-27 MED ORDER — OXYCODONE-ACETAMINOPHEN 10-325 MG PO TABS: 1.0000 | ORAL_TABLET | ORAL | 0 refills | Status: DC | PRN

## 2019-06-27 NOTE — Telephone Encounter (Signed)
Refill request sent in. Pt called stating pharmacy will not fill until 06/30/19 and wanted to know if we could have pharmacy fill prescription today. Pt reminded that it is too early to fill prescription and that he would have to wait until Monday to receive refill. Pt advised to talk to PCP in regards to pain from recent fall. Pt advised to speak with Dr. Alen Blew about his chronic pain management at next appointment on 07/03/19. Pt verbalized understanding.

## 2019-06-30 MED FILL — OXYCODONE-APAP 10-325: 10-325 | 20 days supply | Qty: 120 | Fill #0

## 2019-07-03 ENCOUNTER — Inpatient Hospital Stay: Payer: Medicaid Other

## 2019-07-03 ENCOUNTER — Inpatient Hospital Stay: Payer: Medicaid Other | Attending: Oncology | Admitting: Oncology

## 2019-07-03 ENCOUNTER — Other Ambulatory Visit: Payer: Self-pay

## 2019-07-03 VITALS — BP 116/78 | HR 82 | Temp 98.3°F | Resp 18 | Ht 73.0 in | Wt 180.8 lb

## 2019-07-03 DIAGNOSIS — Z79899 Other long term (current) drug therapy: Secondary | ICD-10-CM | POA: Insufficient documentation

## 2019-07-03 DIAGNOSIS — Z791 Long term (current) use of non-steroidal anti-inflammatories (NSAID): Secondary | ICD-10-CM | POA: Diagnosis not present

## 2019-07-03 DIAGNOSIS — Z9221 Personal history of antineoplastic chemotherapy: Secondary | ICD-10-CM | POA: Diagnosis not present

## 2019-07-03 DIAGNOSIS — C7951 Secondary malignant neoplasm of bone: Secondary | ICD-10-CM

## 2019-07-03 DIAGNOSIS — C61 Malignant neoplasm of prostate: Secondary | ICD-10-CM

## 2019-07-03 DIAGNOSIS — Z923 Personal history of irradiation: Secondary | ICD-10-CM | POA: Diagnosis not present

## 2019-07-03 DIAGNOSIS — C799 Secondary malignant neoplasm of unspecified site: Secondary | ICD-10-CM | POA: Diagnosis not present

## 2019-07-03 DIAGNOSIS — I10 Essential (primary) hypertension: Secondary | ICD-10-CM | POA: Insufficient documentation

## 2019-07-03 LAB — CMP (CANCER CENTER ONLY)
ALT: 6 U/L (ref 0–44)
AST: 25 U/L (ref 15–41)
Albumin: 3.3 g/dL — ABNORMAL LOW (ref 3.5–5.0)
Alkaline Phosphatase: 3884 U/L — ABNORMAL HIGH (ref 38–126)
Anion gap: 7 (ref 5–15)
BUN: 12 mg/dL (ref 6–20)
CO2: 26 mmol/L (ref 22–32)
Calcium: 9 mg/dL (ref 8.9–10.3)
Chloride: 103 mmol/L (ref 98–111)
Creatinine: 0.58 mg/dL — ABNORMAL LOW (ref 0.61–1.24)
GFR, Est AFR Am: >60 mL/min (ref >60)
GFR, Estimated: >60 mL/min (ref >60)
Glucose, Bld: 92 mg/dL (ref 70–99)
Potassium: 3.6 mmol/L (ref 3.5–5.1)
Sodium: 136 mmol/L (ref 135–145)
Total Bilirubin: 0.3 mg/dL (ref 0.3–1.2)
Total Protein: 7 g/dL (ref 6.5–8.1)

## 2019-07-03 LAB — CBC WITH DIFFERENTIAL (CANCER CENTER ONLY)
Abs Immature Granulocytes: 0.01 10*3/uL (ref 0.00–0.07)
Basophils Absolute: 0 10*3/uL (ref 0.0–0.1)
Basophils Relative: 0 %
Eosinophils Absolute: 0 10*3/uL (ref 0.0–0.5)
Eosinophils Relative: 1 %
HCT: 34.8 % — ABNORMAL LOW (ref 39.0–52.0)
Hemoglobin: 10.6 g/dL — ABNORMAL LOW (ref 13.0–17.0)
Immature Granulocytes: 0 %
Lymphocytes Relative: 15 %
Lymphs Abs: 0.8 10*3/uL (ref 0.7–4.0)
MCH: 26 pg (ref 26.0–34.0)
MCHC: 30.5 g/dL (ref 30.0–36.0)
MCV: 85.3 fL (ref 80.0–100.0)
Monocytes Absolute: 0.5 10*3/uL (ref 0.1–1.0)
Monocytes Relative: 9 %
Neutro Abs: 4 10*3/uL (ref 1.7–7.7)
Neutrophils Relative %: 75 %
Platelet Count: 335 10*3/uL (ref 150–400)
RBC: 4.08 MIL/uL — ABNORMAL LOW (ref 4.22–5.81)
RDW: 23.4 % — ABNORMAL HIGH (ref 11.5–15.5)
WBC Count: 5.3 10*3/uL (ref 4.0–10.5)
nRBC: 0 % (ref 0.0–0.2)

## 2019-07-03 NOTE — Progress Notes (Signed)
Hematology and Oncology Follow Up Visit  Roy Rosales [id number] Jul 04, 1966 53 y.o. 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  He was found to have Gleason score 5+4 = 9 and a PSA of 335 at the time of diagnosis.  Prior Therapy:  He is status post prostate biopsy completed in February 2020.  Confirmed the presence of prostate adenocarcinoma Gleason score 5+4 = 9.  He is status post radiation therapy to the thoracic spine.  He completed 30 Gy in 10 fractions in March 2020.  Firmagon 240 mg given in February 2020.  Taxotere chemotherapy 75 mg per metered square started on 01/02/2019.  He completed 6 cycles of therapy.   Current therapy: Zytiga 1000 mg daily with prednisone started in August 2020.   Lupron 30 mg every 4 months started on 01/23/2019.  Interim History: Roy Rosales is here for a follow-up.  Since the last visit, he has started the Zytiga and prednisone and has tolerated without any complications.  He denies any nausea, vomiting or abdominal pain.  He continues to have chronic pain issues related to his prostate cancer as well as recent falls.  He is ambulating with the help of walker without any falls as of late.  He is eating well however lost 4 pounds at this time.  He denied any constipation and does use MiraLAX at times if needed.  He denied headaches, blurry vision, syncope or seizures.  Denies any fevers, chills or sweats.  Denied chest pain, palpitation, orthopnea or leg edema.  Denied cough, wheezing or hemoptysis.  Denied nausea, vomiting or abdominal pain.  Denies any constipation or diarrhea.  Denies any frequency urgency or hesitancy.  Denies any arthralgias or myalgias.  Denies any skin rashes or lesions.  Denies any bleeding or clotting tendency.  Denies any easy bruising.  Denies any hair or nail  changes.  Denies any anxiety or depression.  Remaining review of system is negative.         Medications: Reviewed without any changes. Current Outpatient Medications  Medication Sig Dispense Refill  . abiraterone acetate (ZYTIGA) 250 MG tablet TAKE 4 TABLETS (1,000 MG TOTAL) BY MOUTH DAILY. TAKE ON AN EMPTY STOMACH 1 HOUR BEFORE OR 2 HOURS AFTER A MEAL 120 tablet 0  . albuterol (PROVENTIL HFA;VENTOLIN HFA) 108 (90 Base) MCG/ACT inhaler Inhale 2 puffs into the lungs every 4 (four) hours as needed. 1 Inhaler 2  . atenolol (TENORMIN) 100 MG tablet Take 1 tablet (100 mg total) by mouth daily. 30 tablet 6  . celecoxib (CELEBREX) 100 MG capsule Take 1 capsule (100 mg total) by mouth 2 (two) times daily. Eat before medication 60 capsule 3  . diazepam (DIASTAT ACUDIAL) 10 MG GEL Place 10 mg rectally once as needed for up to 1 dose for seizure. 1 Package 0  . doxycycline (VIBRA-TABS) 100 MG tablet Take 1 tablet (100 mg total) by mouth 2 (two) times daily. After eating to treat bronchitis 20 tablet 0  . gabapentin (NEURONTIN) 300 MG capsule Take 1 capsule (300 mg total) by mouth 3 (three) times daily. Start with 1 pill at bedtime for the first week then increase if tolerated 90 capsule 6  . levETIRAcetam (KEPPRA) 500 MG tablet Take 1 tablet (500 mg total) by mouth 2 (two) times daily. 60 tablet 11  . morphine (MS CONTIN) 60 MG 12 hr tablet  Take 1 tablet (60 mg total) by mouth every 12 (twelve) hours. 60 tablet 0  . ondansetron (ZOFRAN) 8 MG tablet Take 1 tablet (8 mg total) by mouth every 8 (eight) hours as needed for nausea or vomiting (nausea). 40 tablet 1  . oxyCODONE-acetaminophen (PERCOCET) 10-325 MG tablet Take 1 tablet by mouth every 4 (four) hours as needed for pain. 120 tablet 0  . polyethylene glycol powder (GLYCOLAX/MIRALAX) powder Take 17 g by mouth daily. Mixed with at least 8 ounces of water to treat constipation 3350 g 11  . potassium chloride SA (K-DUR) 20 MEQ tablet Take 2 tablets (40  mEq total) by mouth daily. 21 tablet 0  . predniSONE (DELTASONE) 5 MG tablet Take 1 tablet (5 mg total) by mouth daily with breakfast. 90 tablet 3  . prochlorperazine (COMPAZINE) 10 MG tablet Take 1 tablet (10 mg total) by mouth every 6 (six) hours as needed for nausea or vomiting. 60 tablet 0  . triamterene-hydrochlorothiazide (MAXZIDE) 75-50 MG tablet Take 1 tablet by mouth daily. 30 tablet 6  . valACYclovir (VALTREX) 500 MG tablet Take 500 mg by mouth daily.     No current facility-administered medications for this visit.      Allergies: No Known Allergies  Past Medical History, Surgical history, Social history, and Family History updated on review.   Physical Exam:    Blood pressure 116/78, pulse 82, temperature 98.3 F (36.8 C), temperature source Oral, resp. rate 18, height 6\' 1"  (1.854 m), weight 180 lb 12.8 oz (82 kg), SpO2 100 %.    ECOG: 1   General appearance: Comfortable appearing without any discomfort Head: Normocephalic without any trauma Oropharynx: Mucous membranes are moist and pink without any thrush or ulcers. Eyes: Pupils are equal and round reactive to light. Lymph nodes: No cervical, supraclavicular, inguinal or axillary lymphadenopathy.   Heart:regular rate and rhythm.  S1 and S2 without leg edema. Lung: Clear without any rhonchi or wheezes.  No dullness to percussion. Abdomin: Soft, nontender, nondistended with good bowel sounds.  No hepatosplenomegaly. Musculoskeletal: No joint deformity or effusion.  Full range of motion noted. Neurological: No deficits noted on motor, sensory and deep tendon reflex exam. Skin: No petechial rash or dryness.  Appeared moist.              Lab Results: Lab Results  Component Value Date   WBC 7.9 05/28/2019   HGB 8.0 (L) 05/28/2019   HCT 27.6 (L) 05/28/2019   MCV 85.7 05/28/2019   PLT 413 (H) 05/28/2019     Chemistry      Component Value Date/Time   NA 134 (L) 05/28/2019 1241   NA 134 11/20/2018  1138   K 4.0 05/28/2019 1241   CL 98 05/28/2019 1241   CO2 27 05/28/2019 1241   BUN 13 05/28/2019 1241   BUN 11 11/20/2018 1138   CREATININE 0.69 05/28/2019 1241      Component Value Date/Time   CALCIUM 9.2 05/28/2019 1241   ALKPHOS 2,157 (H) 05/28/2019 1241   AST 73 (H) 05/28/2019 1241   ALT 15 05/28/2019 1241   BILITOT 0.6 05/28/2019 1241      Results for QUSAY, KOVACK (MRN [id number]) as of 07/03/2019 15:17  Ref. Range 04/17/2019 08:59 05/28/2019 12:41  Prostate Specific Ag, Serum Latest Ref Range: 0.0 - 4.0 ng/mL 10.1 (H) 22.5 (H)        Impression and Plan:  53 year old man with:  1.    Castration-resistant prostate cancer with disease to  the bone and lymphadenopathy diagnosed in 2020.  Marland Kitchen  He is status post therapy outlined above and developed castration hyper resistant disease in August 2020 and has been on Zytiga for the last month.  Risks and benefits of continuing this therapy was discussed today.  Long-term complications including hypokalemia as well as hypertension were reviewed.  At this time he is willing to continue.  2.    Weight loss: We have discussed strategies to improve his intake including nutritional supplements and will consider appetite stimulant in future.  3.    Hypertension: Blood pressure is under control and will continue to monitor on Zytiga.  4.  Androgen deprivation: His next Lupron will be in December 2020.  Long-term complications including weight gain and osteoporosis were reiterated..  5.    Thoracic spine metastasis: No recent relapse or recurrence.  Status post radiation.  6.  Pain: Related to prostate cancer and disease to the bone.  He is still requiring Percocet as needed with more frequent use as of late.  He continues to be on morphine for long-acting purposes.  7.  Bone directed therapy: Delton See has been deferred till he obtains dental clearance.  He is currently on calcium and vitamin D.  8.  Prognosis and goals of  care: Therapy remains palliative although aggressive measures are warranted at this time given his reasonable performance status.  9.  Follow-up: Will be in 4 to 6 weeks for repeat evaluation.  25  minutes was spent with the patient face-to-face today.  More than 50% of time was dedicated to reviewing his disease status, reviewing laboratory data and answering questions regarding future plan of care.    Zola Button, MD 9/3/20203:14 PM

## 2019-07-04 ENCOUNTER — Telehealth: Payer: Self-pay

## 2019-07-04 LAB — PROSTATE-SPECIFIC AG, SERUM (LABCORP): Prostate Specific Ag, Serum: 15 ng/mL — ABNORMAL HIGH (ref 0.0–4.0)

## 2019-07-04 NOTE — Telephone Encounter (Signed)
Patient informed of message below. Pt verbalized understanding.

## 2019-07-04 NOTE — Telephone Encounter (Signed)
-----   Message from Wyatt Portela, MD sent at 07/04/2019  9:06 AM EDT ----- Please let him know his PSA is down.

## 2019-07-08 ENCOUNTER — Other Ambulatory Visit: Payer: Self-pay | Admitting: Oncology

## 2019-07-08 MED ORDER — MORPHINE SULFATE ER 60 MG PO TBCR: 60.0000 mg | EXTENDED_RELEASE_TABLET | Freq: Two times a day (BID) | ORAL | 0 refills | Status: DC

## 2019-07-08 MED FILL — MORPHINE SULF 60 MG TAB SA: 60 | 30 days supply | Qty: 60 | Fill #0

## 2019-07-16 ENCOUNTER — Other Ambulatory Visit: Payer: Self-pay | Admitting: Oncology

## 2019-07-16 MED ORDER — OXYCODONE-ACETAMINOPHEN 10-325 MG PO TABS: 1.0000 | ORAL_TABLET | ORAL | 0 refills | Status: DC | PRN

## 2019-07-18 MED FILL — ABIRATERONE ACETATE 250 MG: 250 | 30 days supply | Qty: 120 | Fill #0

## 2019-07-18 MED FILL — OXYCODONE-APAP 10-325: 10-325 | 20 days supply | Qty: 120 | Fill #0

## 2019-08-01 ENCOUNTER — Other Ambulatory Visit: Payer: Self-pay | Admitting: Oncology

## 2019-08-01 ENCOUNTER — Telehealth: Payer: Self-pay

## 2019-08-01 MED ORDER — MORPHINE SULFATE ER 60 MG PO TBCR: 60.0000 mg | EXTENDED_RELEASE_TABLET | Freq: Two times a day (BID) | ORAL | 0 refills | Status: DC

## 2019-08-01 MED ORDER — OXYCODONE-ACETAMINOPHEN 10-325 MG PO TABS: 1.0000 | ORAL_TABLET | ORAL | 0 refills | Status: DC | PRN

## 2019-08-01 NOTE — Telephone Encounter (Signed)
Patient made aware that prescription has been sent as requested for pain meds but the earliest fill date is 10/6 per [facility]. Patient asked "can I get some extra pills for the weekend"?. Reiterated to patient that it is too early and the earliest fill date is 10/6. Patient verbalized understanding.

## 2019-08-03 ENCOUNTER — Inpatient Hospital Stay (HOSPITAL_COMMUNITY)
Admission: EM | Admit: 2019-08-03 | Discharge: 2019-08-05 | DRG: 948 | Disposition: A | Discharge status: Home / Self Care | Payer: Medicaid Other | Attending: Family Medicine | Admitting: Family Medicine

## 2019-08-03 ENCOUNTER — Encounter (HOSPITAL_COMMUNITY): Payer: Self-pay

## 2019-08-03 ENCOUNTER — Other Ambulatory Visit: Payer: Self-pay

## 2019-08-03 DIAGNOSIS — D630 Anemia in neoplastic disease: Secondary | ICD-10-CM | POA: Diagnosis present

## 2019-08-03 DIAGNOSIS — Y929 Unspecified place or not applicable: Secondary | ICD-10-CM

## 2019-08-03 DIAGNOSIS — C61 Malignant neoplasm of prostate: Secondary | ICD-10-CM | POA: Diagnosis present

## 2019-08-03 DIAGNOSIS — T50995A Adverse effect of other drugs, medicaments and biological substances, initial encounter: Secondary | ICD-10-CM | POA: Diagnosis present

## 2019-08-03 DIAGNOSIS — Z8249 Family history of ischemic heart disease and other diseases of the circulatory system: Secondary | ICD-10-CM

## 2019-08-03 DIAGNOSIS — I10 Essential (primary) hypertension: Secondary | ICD-10-CM | POA: Diagnosis present

## 2019-08-03 DIAGNOSIS — R197 Diarrhea, unspecified: Secondary | ICD-10-CM | POA: Diagnosis present

## 2019-08-03 DIAGNOSIS — R64 Cachexia: Secondary | ICD-10-CM | POA: Diagnosis present

## 2019-08-03 DIAGNOSIS — Z79891 Long term (current) use of opiate analgesic: Secondary | ICD-10-CM

## 2019-08-03 DIAGNOSIS — F17210 Nicotine dependence, cigarettes, uncomplicated: Secondary | ICD-10-CM | POA: Diagnosis present

## 2019-08-03 DIAGNOSIS — F1020 Alcohol dependence, uncomplicated: Secondary | ICD-10-CM | POA: Diagnosis present

## 2019-08-03 DIAGNOSIS — E872 Acidosis: Secondary | ICD-10-CM | POA: Diagnosis present

## 2019-08-03 DIAGNOSIS — Z79899 Other long term (current) drug therapy: Secondary | ICD-10-CM

## 2019-08-03 DIAGNOSIS — M1711 Unilateral primary osteoarthritis, right knee: Secondary | ICD-10-CM | POA: Diagnosis present

## 2019-08-03 DIAGNOSIS — Z6826 Body mass index (BMI) 26.0-26.9, adult: Secondary | ICD-10-CM

## 2019-08-03 DIAGNOSIS — G893 Neoplasm related pain (acute) (chronic): Principal | ICD-10-CM | POA: Diagnosis present

## 2019-08-03 DIAGNOSIS — Z800 Family history of malignant neoplasm of digestive organs: Secondary | ICD-10-CM

## 2019-08-03 DIAGNOSIS — F1910 Other psychoactive substance abuse, uncomplicated: Secondary | ICD-10-CM | POA: Diagnosis present

## 2019-08-03 DIAGNOSIS — Z720 Tobacco use: Secondary | ICD-10-CM | POA: Diagnosis present

## 2019-08-03 DIAGNOSIS — G8929 Other chronic pain: Secondary | ICD-10-CM

## 2019-08-03 DIAGNOSIS — E785 Hyperlipidemia, unspecified: Secondary | ICD-10-CM | POA: Diagnosis present

## 2019-08-03 DIAGNOSIS — D649 Anemia, unspecified: Secondary | ICD-10-CM | POA: Diagnosis present

## 2019-08-03 DIAGNOSIS — G40909 Epilepsy, unspecified, not intractable, without status epilepticus: Secondary | ICD-10-CM | POA: Diagnosis present

## 2019-08-03 DIAGNOSIS — C7951 Secondary malignant neoplasm of bone: Secondary | ICD-10-CM | POA: Diagnosis present

## 2019-08-03 DIAGNOSIS — Z8042 Family history of malignant neoplasm of prostate: Secondary | ICD-10-CM

## 2019-08-03 DIAGNOSIS — Z803 Family history of malignant neoplasm of breast: Secondary | ICD-10-CM

## 2019-08-03 DIAGNOSIS — Z7952 Long term (current) use of systemic steroids: Secondary | ICD-10-CM

## 2019-08-03 DIAGNOSIS — Z20828 Contact with and (suspected) exposure to other viral communicable diseases: Secondary | ICD-10-CM | POA: Diagnosis present

## 2019-08-03 DIAGNOSIS — J449 Chronic obstructive pulmonary disease, unspecified: Secondary | ICD-10-CM | POA: Diagnosis present

## 2019-08-03 DIAGNOSIS — E876 Hypokalemia: Secondary | ICD-10-CM | POA: Diagnosis present

## 2019-08-03 DIAGNOSIS — R112 Nausea with vomiting, unspecified: Secondary | ICD-10-CM | POA: Diagnosis present

## 2019-08-03 LAB — CBC WITH DIFFERENTIAL/PLATELET
Abs Immature Granulocytes: 0.04 10*3/uL (ref 0.00–0.07)
Basophils Absolute: 0 10*3/uL (ref 0.0–0.1)
Basophils Relative: 0 %
Eosinophils Absolute: 0 10*3/uL (ref 0.0–0.5)
Eosinophils Relative: 0 %
HCT: 30.3 % — ABNORMAL LOW (ref 39.0–52.0)
Hemoglobin: 9.2 g/dL — ABNORMAL LOW (ref 13.0–17.0)
Immature Granulocytes: 1 %
Lymphocytes Relative: 11 %
Lymphs Abs: 0.9 10*3/uL (ref 0.7–4.0)
MCH: 24.5 pg — ABNORMAL LOW (ref 26.0–34.0)
MCHC: 30.4 g/dL (ref 30.0–36.0)
MCV: 80.8 fL (ref 80.0–100.0)
Monocytes Absolute: 0.7 10*3/uL (ref 0.1–1.0)
Monocytes Relative: 9 %
Neutro Abs: 6.7 10*3/uL (ref 1.7–7.7)
Neutrophils Relative %: 79 %
Platelets: 435 10*3/uL — ABNORMAL HIGH (ref 150–400)
RBC: 3.75 MIL/uL — ABNORMAL LOW (ref 4.22–5.81)
RDW: 22.5 % — ABNORMAL HIGH (ref 11.5–15.5)
WBC: 8.4 10*3/uL (ref 4.0–10.5)
nRBC: 0 % (ref 0.0–0.2)

## 2019-08-03 LAB — COMPREHENSIVE METABOLIC PANEL
ALT: 10 U/L (ref 0–44)
AST: 39 U/L (ref 15–41)
Albumin: 3.1 g/dL — ABNORMAL LOW (ref 3.5–5.0)
Alkaline Phosphatase: 2544 U/L — ABNORMAL HIGH (ref 38–126)
Anion gap: 11 (ref 5–15)
BUN: 8 mg/dL (ref 6–20)
CO2: 21 mmol/L — ABNORMAL LOW (ref 22–32)
Calcium: 8.8 mg/dL — ABNORMAL LOW (ref 8.9–10.3)
Chloride: 101 mmol/L (ref 98–111)
Creatinine, Ser: 0.37 mg/dL — ABNORMAL LOW (ref 0.61–1.24)
GFR calc Af Amer: >60 mL/min (ref >60)
GFR calc non Af Amer: >60 mL/min (ref >60)
Glucose, Bld: 98 mg/dL (ref 70–99)
Potassium: 2.9 mmol/L — ABNORMAL LOW (ref 3.5–5.1)
Sodium: 133 mmol/L — ABNORMAL LOW (ref 135–145)
Total Bilirubin: 0.7 mg/dL (ref 0.3–1.2)
Total Protein: 6.9 g/dL (ref 6.5–8.1)

## 2019-08-03 LAB — LIPASE, BLOOD: Lipase: 22 U/L (ref 11–51)

## 2019-08-03 MED ORDER — ONDANSETRON HCL 4 MG/2ML IJ SOLN
4.0000 mg | Freq: Once | INTRAMUSCULAR | Status: AC
  Administered 2019-08-03: 22:00:00 4 mg via INTRAVENOUS
  Filled 2019-08-03: qty 2

## 2019-08-03 MED ORDER — POTASSIUM CHLORIDE 10 MEQ/100ML IV SOLN
10.0000 meq | INTRAVENOUS | Status: AC
  Administered 2019-08-04 (×2): 10 meq via INTRAVENOUS
  Filled 2019-08-03 (×2): qty 100

## 2019-08-03 MED ORDER — MORPHINE SULFATE (PF) 4 MG/ML IV SOLN
4.0000 mg | Freq: Once | INTRAVENOUS | Status: AC
  Administered 2019-08-03: 22:00:00 4 mg via INTRAVENOUS
  Filled 2019-08-03: qty 1

## 2019-08-03 MED ORDER — ONDANSETRON HCL 4 MG/2ML IJ SOLN
4.0000 mg | Freq: Once | INTRAMUSCULAR | Status: AC
  Administered 2019-08-04: 4 mg via INTRAVENOUS
  Filled 2019-08-03: qty 2

## 2019-08-03 MED ORDER — SODIUM CHLORIDE 0.9 % IV BOLUS
1000.0000 mL | Freq: Once | INTRAVENOUS | Status: AC
  Administered 2019-08-04: 1000 mL via INTRAVENOUS

## 2019-08-03 MED ORDER — POTASSIUM CHLORIDE CRYS ER 20 MEQ PO TBCR
40.0000 meq | EXTENDED_RELEASE_TABLET | Freq: Once | ORAL | Status: AC
  Administered 2019-08-04: 40 meq via ORAL
  Filled 2019-08-03: qty 2

## 2019-08-03 MED ORDER — HYDROMORPHONE HCL 1 MG/ML IJ SOLN
1.0000 mg | Freq: Once | INTRAMUSCULAR | Status: AC
  Administered 2019-08-03: 1 mg via INTRAVENOUS
  Filled 2019-08-03: qty 1

## 2019-08-03 MED ORDER — SODIUM CHLORIDE 0.9 % IV BOLUS
1000.0000 mL | Freq: Once | INTRAVENOUS | Status: AC
  Administered 2019-08-03: 22:00:00 1000 mL via INTRAVENOUS

## 2019-08-03 NOTE — ED Triage Notes (Signed)
Pt arrived from home with complaints of back and bilateral hip pain, states that he take oral chemo for bone cancer and ran out of his pain medication 3 days ago. Also reports nausea, vomiting, and weakness. Pt given 500 cc of NS, fentanyl and zofran en route.

## 2019-08-03 NOTE — ED Provider Notes (Signed)
[facility] DEPT Provider Note   CSN: [id number] Arrival date & time: 08/03/19  2034     History   Chief Complaint No chief complaint on file.   HPI SEQUAN KUO is a 53 y.o. male.     The history is provided by the patient and medical records. No language interpreter was used.  Emesis Severity:  Severe Duration:  3 days Timing:  Intermittent Progression:  Unchanged Chronicity:  Recurrent Recent urination:  Normal Relieved by:  Nothing Worsened by:  Nothing Ineffective treatments:  None tried Associated symptoms: no abdominal pain, no chills, no cough, no diarrhea, no fever and no headaches     Past Medical History:  Diagnosis Date   COPD (chronic obstructive pulmonary disease) ([place])    Hypertension    Polysubstance abuse ([place])    Prostate cancer [facility])     Patient Active Problem List   Diagnosis Date Noted   Metastatic cancer to spine ([place]) 04/16/2019   Unilateral primary osteoarthritis, right knee 04/15/2019   Metastatic cancer ([place])    Palliative care by specialist    Cancer associated pain    DNR (do not resuscitate)    Goals of care, counseling/discussion 12/20/2018   Prostate cancer metastatic to bone ([place]) 12/02/2018   Urgency of urination 11/21/2018   Elevated PSA 11/21/2018   Elevated alkaline phosphatase level 11/21/2018   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 Authorizing Provider  abiraterone acetate  (ZYTIGA) 250 MG tablet TAKE 4 TABLETS (1,000 MG TOTAL) BY MOUTH DAILY. TAKE ON AN EMPTY STOMACH 1 HOUR BEFORE OR 2 HOURS AFTER A MEAL 07/16/19   Wyatt Portela, MD  albuterol (PROVENTIL HFA;VENTOLIN HFA) 108 (90 Base) MCG/ACT inhaler Inhale 2 puffs into the lungs every 4 (four) hours as needed. 11/20/18   Elsie Stain, MD  atenolol (TENORMIN) 100 MG tablet Take 1 tablet (100 mg total) by mouth daily. 11/20/18   Elsie Stain, MD  celecoxib (CELEBREX) 100 MG capsule Take 1 capsule (100 mg total) by mouth 2 (two) times daily. Eat before medication 03/21/19   Fulp, Cammie, MD  diazepam (DIASTAT ACUDIAL) 10 MG GEL Place 10 mg rectally once as needed for up to 1 dose for seizure. 09/07/18   Orpah Greek, MD  doxycycline (VIBRA-TABS) 100 MG tablet Take 1 tablet (100 mg total) by mouth 2 (two) times daily. After eating to treat bronchitis 01/01/19   Fulp, Cammie, MD  gabapentin (NEURONTIN) 300 MG capsule Take 1 capsule (300 mg total) by mouth 3 (three) times daily. Start with 1 pill at bedtime for the first week then increase if tolerated 01/01/19   Fulp, Cammie, MD  levETIRAcetam (KEPPRA) 500 MG tablet Take 1 tablet (500 mg total) by mouth 2 (two) times daily. 11/20/18   Elsie Stain, MD  morphine (MS CONTIN) 60 MG 12 hr tablet Take 1 tablet (60 mg total) by mouth every 12 (twelve) hours. 08/01/19  Wyatt Portela, MD  ondansetron (ZOFRAN) 8 MG tablet Take 1 tablet (8 mg total) by mouth every 8 (eight) hours as needed for nausea or vomiting (nausea). 02/13/19   Wyatt Portela, MD  oxyCODONE-acetaminophen (PERCOCET) 10-325 MG tablet Take 1 tablet by mouth every 4 (four) hours as needed for pain. 08/01/19   Wyatt Portela, MD  polyethylene glycol powder (GLYCOLAX/MIRALAX) powder Take 17 g by mouth daily. Mixed with at least 8 ounces of water to treat constipation 01/01/19   Fulp, Cammie, MD  potassium chloride SA (K-DUR) 20 MEQ tablet Take 2 tablets (40 mEq total) by mouth daily. 04/17/19   Wyatt Portela, MD  predniSONE (DELTASONE) 5 MG tablet Take 1 tablet (5 mg total) by mouth daily with breakfast. 05/30/19   Wyatt Portela, MD  prochlorperazine (COMPAZINE) 10 MG tablet Take 1 tablet (10 mg total) by mouth every 6 (six) hours as needed for nausea or vomiting. 05/16/19   Wyatt Portela, MD  triamterene-hydrochlorothiazide (MAXZIDE) 75-50 MG tablet Take 1 tablet by mouth daily. 11/20/18   Elsie Stain, MD  valACYclovir (VALTREX) 500 MG tablet Take 500 mg by mouth daily. 09/19/18   [provider]    Family History Family History  Problem Relation Age of Onset   Breast cancer Mother    Hypertension Mother    Cancer Sister    Prostate cancer Father    Prostate cancer Brother    Stomach cancer Brother    Seizures Neg Hx     Social History Social History   Tobacco Use   Smoking status: Current Every Day Smoker    Packs/day: 1.00    Types: Cigarettes   Smokeless tobacco: Never Used  Substance Use Topics   Alcohol use: Yes    Comment: 6-12+ beer/day; update 09/12/18 none since 09/07/18, plans to quit, was drinking 5-6 beers per day   Drug use: Yes    Types: Marijuana, PCP    Comment: PCP for about 3 months, none since 2017 or 2018     Allergies   Patient has no known allergies.   Review of Systems Review of Systems  Constitutional: Positive for fatigue. Negative for chills, diaphoresis and fever.  HENT: Negative for congestion.   Respiratory: Negative for cough, chest tightness and shortness of breath.   Gastrointestinal: Positive for nausea and vomiting. Negative for abdominal distention, abdominal pain, constipation and diarrhea.  Genitourinary: Negative for dysuria, flank pain and frequency.  Musculoskeletal: Positive for back pain. Negative for neck pain.  Neurological: Negative for weakness, light-headedness, numbness and headaches.  Psychiatric/Behavioral: Negative for agitation.  All other systems reviewed and are  negative.    Physical Exam Updated Vital Signs BP 126/76 (BP Location: Right Arm)    Pulse 87    Temp 99.2 F (37.3 C) (Oral)    Resp 18    Ht 6\' 1"  (1.854 m)    Wt 90.7 kg    SpO2 92%    BMI 26.39 kg/m   Physical Exam Vitals signs and nursing note reviewed.  Constitutional:      General: He is not in acute distress.    Appearance: He is well-developed. He is not ill-appearing, toxic-appearing or diaphoretic.  HENT:     Head: Normocephalic and atraumatic.     Nose: No congestion or rhinorrhea.  Eyes:     Conjunctiva/sclera: Conjunctivae normal.     Pupils: Pupils are equal, round, and reactive to light.  Neck:  Musculoskeletal: Neck supple.  Cardiovascular:     Rate and Rhythm: Normal rate and regular rhythm.     Pulses: Normal pulses.     Heart sounds: No murmur.  Pulmonary:     Effort: Pulmonary effort is normal. No respiratory distress.     Breath sounds: Normal breath sounds. No wheezing, rhonchi or rales.  Chest:     Chest wall: No tenderness.  Abdominal:     General: Abdomen is flat.     Palpations: Abdomen is soft.     Tenderness: There is no abdominal tenderness. There is no right CVA tenderness or left CVA tenderness.  Musculoskeletal: Normal range of motion.        General: Tenderness present. No swelling or signs of injury.     Lumbar back: He exhibits tenderness and pain.       Back:     Right lower leg: No edema.     Left lower leg: No edema.  Skin:    General: Skin is warm and dry.     Capillary Refill: Capillary refill takes less than 2 seconds.     Findings: No erythema or rash.  Neurological:     General: No focal deficit present.     Mental Status: He is alert.  Psychiatric:        Mood and Affect: Mood normal.      ED Treatments / Results  Labs (all labs ordered are listed, but only abnormal results are displayed) Labs Reviewed  CBC WITH DIFFERENTIAL/PLATELET - Abnormal; Notable for the following components:      Result Value   RBC  3.75 (*)    Hemoglobin 9.2 (*)    HCT 30.3 (*)    MCH 24.5 (*)    RDW 22.5 (*)    Platelets 435 (*)    All other components within normal limits  COMPREHENSIVE METABOLIC PANEL - Abnormal; Notable for the following components:   Sodium 133 (*)    Potassium 2.9 (*)    CO2 21 (*)    Creatinine, Ser 0.37 (*)    Calcium 8.8 (*)    Albumin 3.1 (*)    All other components within normal limits  LIPASE, BLOOD    EKG None  Radiology No results found.  Procedures Procedures (including critical care time)  Medications Ordered in ED Medications  potassium chloride 10 mEq in 100 mL IVPB (has no administration in time range)  potassium chloride SA (KLOR-CON) CR tablet 40 mEq (has no administration in time range)  sodium chloride 0.9 % bolus 1,000 mL (1,000 mLs Intravenous Bolus from Bag 08/03/19 2148)  morphine 4 MG/ML injection 4 mg (4 mg Intravenous Given 08/03/19 2152)  ondansetron (ZOFRAN) injection 4 mg (4 mg Intravenous Given 08/03/19 2150)  HYDROmorphone (DILAUDID) injection 1 mg (1 mg Intravenous Given 08/03/19 2324)     Initial Impression / Assessment and Plan / ED Course  I have reviewed the triage vital signs and the nursing notes.  Pertinent labs & imaging results that were available during my care of the patient were reviewed by me and considered in my medical decision making (see chart for details).        ESPIRIDION CLAEYS is a 53 y.o. male with a past medical history significant for prostate cancer with bony metastasis, COPD, hypertension, and prior polysubstance abuse who presents with fatigue, nausea, vomiting, and worsened back pain.  Patient reports that for the last few days he has had back pain, nausea and vomiting  as he reports he is now out of his pain medicine and nausea medicine.  He reports no new injuries specifically no new falls.  He denies any loss of bowel or bladder control.  No new leg numbness tingling, or weakness.  He reports feeling fatigued all over  as he is not eating and drinking well.  He reports that the pain is feeling feels similar to pain he had in the past when he was not having pain medicine.  He says there is not a new pain.  He denies any fevers, chills, chest pain, shortness of breath.  No urinary symptoms or constipation or diarrhea.  He reports the pain is severe.  He was given fentanyl fluids, nausea medicine by EMS with some improvement in symptoms.  On exam, patient has some low back tenderness paraspinally.  Lungs are clear and chest is nontender.  Abdomen is nontender.  Patient has pain moving legs and his hips but has good pulses, sensation, and strength in legs.  Had a shared decision made conversation with patient about plan and work-up.  As he denies new injury and this pain feels similar to prior, I suspect that patient's symptoms are related to running out of his pain and nausea medicine as opposed to a new infection or injury.  With the patient's decreased oral intake with nausea and vomiting and fatigue, we agreed to get some screening labs as well as give the patient fluids for likely dehydration.  He also given pain medicine nausea medicine.  Patient agrees that this is a reasonable plan to hold on further imaging due to lack of new injury or other symptoms.  He is agreeable to getting prescriptions of pain and nausea medicine refilled and follow-up with his oncology team in the next several days if other work-up is reassuring and he is feeling better.  Anticipate reassessment after work-up.  11:45 PM Patient's work-up shows similar anemia but does show hypokalemia of 2.9.  He reports that he has had hypokalemia in the past and was on potassium but he stopped it when it was corrected.  He reports that with running out of nausea medicine and pain medicine, he has not been able to eat or drink over the last several days and thinks this is causing it.  Patient was given IV and oral potassium and will be given more pain  medication.  He will be reassessed to see if he is able to p.o. challenge.  Patient is able to eat and drink and take oral potassium, he will likely be appropriate for discharge home with a refill of some home pain medicine until he can see his oncology team in the next few days.  If he is unable to maintain hydration or replete his potassium as an outpatient, he may need admission for cancer pain control and electrolyte imbalance without tolerating p.o.    Final Clinical Impressions(s) / ED Diagnoses   Final diagnoses:  Intractable vomiting with nausea, unspecified vomiting type  Hypokalemia  Chronic bilateral low back pain without sciatica     Clinical Impression: 1. Intractable vomiting with nausea, unspecified vomiting type   2. Hypokalemia   3. Chronic bilateral low back pain without sciatica     Disposition: Care transferred to oncoming team while awaiting reassessment after repletion of potassium for hypokalemia, or nausea medicine, and more pain medication with rehydration.  This note was prepared with assistance of Systems analyst. Occasional wrong-word or sound-a-like substitutions may have occurred due to  the inherent limitations of voice recognition software.      Habiba Treloar, Gwenyth Allegra, MD 08/04/19 0000

## 2019-08-03 NOTE — ED Notes (Signed)
Pt reports bilateral hip pain. He states that it is a "moving pain." He agreed to have pillows placed under hips and his bed adjusted. Pt states that this position is more comfortable, but pain persists.

## 2019-08-04 ENCOUNTER — Emergency Department (HOSPITAL_COMMUNITY): Payer: Medicaid Other

## 2019-08-04 ENCOUNTER — Other Ambulatory Visit: Payer: Self-pay

## 2019-08-04 DIAGNOSIS — E785 Hyperlipidemia, unspecified: Secondary | ICD-10-CM | POA: Diagnosis present

## 2019-08-04 DIAGNOSIS — Z6826 Body mass index (BMI) 26.0-26.9, adult: Secondary | ICD-10-CM | POA: Diagnosis not present

## 2019-08-04 DIAGNOSIS — Y929 Unspecified place or not applicable: Secondary | ICD-10-CM | POA: Diagnosis not present

## 2019-08-04 DIAGNOSIS — G40909 Epilepsy, unspecified, not intractable, without status epilepticus: Secondary | ICD-10-CM

## 2019-08-04 DIAGNOSIS — G893 Neoplasm related pain (acute) (chronic): Secondary | ICD-10-CM | POA: Diagnosis present

## 2019-08-04 DIAGNOSIS — Z7952 Long term (current) use of systemic steroids: Secondary | ICD-10-CM | POA: Diagnosis not present

## 2019-08-04 DIAGNOSIS — M545 Low back pain: Secondary | ICD-10-CM | POA: Diagnosis not present

## 2019-08-04 DIAGNOSIS — R197 Diarrhea, unspecified: Secondary | ICD-10-CM | POA: Diagnosis present

## 2019-08-04 DIAGNOSIS — Z20828 Contact with and (suspected) exposure to other viral communicable diseases: Secondary | ICD-10-CM | POA: Diagnosis present

## 2019-08-04 DIAGNOSIS — J449 Chronic obstructive pulmonary disease, unspecified: Secondary | ICD-10-CM | POA: Diagnosis present

## 2019-08-04 DIAGNOSIS — F17210 Nicotine dependence, cigarettes, uncomplicated: Secondary | ICD-10-CM | POA: Diagnosis present

## 2019-08-04 DIAGNOSIS — C61 Malignant neoplasm of prostate: Secondary | ICD-10-CM

## 2019-08-04 DIAGNOSIS — G8929 Other chronic pain: Secondary | ICD-10-CM | POA: Insufficient documentation

## 2019-08-04 DIAGNOSIS — E872 Acidosis: Secondary | ICD-10-CM | POA: Diagnosis present

## 2019-08-04 DIAGNOSIS — T50995A Adverse effect of other drugs, medicaments and biological substances, initial encounter: Secondary | ICD-10-CM | POA: Diagnosis present

## 2019-08-04 DIAGNOSIS — F1910 Other psychoactive substance abuse, uncomplicated: Secondary | ICD-10-CM

## 2019-08-04 DIAGNOSIS — Z720 Tobacco use: Secondary | ICD-10-CM

## 2019-08-04 DIAGNOSIS — Z79891 Long term (current) use of opiate analgesic: Secondary | ICD-10-CM | POA: Diagnosis not present

## 2019-08-04 DIAGNOSIS — R112 Nausea with vomiting, unspecified: Secondary | ICD-10-CM | POA: Diagnosis present

## 2019-08-04 DIAGNOSIS — D649 Anemia, unspecified: Secondary | ICD-10-CM | POA: Diagnosis present

## 2019-08-04 DIAGNOSIS — I10 Essential (primary) hypertension: Secondary | ICD-10-CM

## 2019-08-04 DIAGNOSIS — Z8249 Family history of ischemic heart disease and other diseases of the circulatory system: Secondary | ICD-10-CM | POA: Diagnosis not present

## 2019-08-04 DIAGNOSIS — Z8042 Family history of malignant neoplasm of prostate: Secondary | ICD-10-CM | POA: Diagnosis not present

## 2019-08-04 DIAGNOSIS — E876 Hypokalemia: Secondary | ICD-10-CM | POA: Diagnosis present

## 2019-08-04 DIAGNOSIS — C7951 Secondary malignant neoplasm of bone: Secondary | ICD-10-CM | POA: Diagnosis present

## 2019-08-04 DIAGNOSIS — Z800 Family history of malignant neoplasm of digestive organs: Secondary | ICD-10-CM | POA: Diagnosis not present

## 2019-08-04 DIAGNOSIS — Z803 Family history of malignant neoplasm of breast: Secondary | ICD-10-CM | POA: Diagnosis not present

## 2019-08-04 DIAGNOSIS — Z79899 Other long term (current) drug therapy: Secondary | ICD-10-CM | POA: Diagnosis not present

## 2019-08-04 DIAGNOSIS — R64 Cachexia: Secondary | ICD-10-CM | POA: Diagnosis present

## 2019-08-04 DIAGNOSIS — F1020 Alcohol dependence, uncomplicated: Secondary | ICD-10-CM | POA: Diagnosis present

## 2019-08-04 LAB — RAPID URINE DRUG SCREEN, HOSP PERFORMED
Amphetamines: NOT DETECTED
Barbiturates: NOT DETECTED
Benzodiazepines: NOT DETECTED
Cocaine: NOT DETECTED
Opiates: POSITIVE — AB
Tetrahydrocannabinol: POSITIVE — AB

## 2019-08-04 LAB — COMPREHENSIVE METABOLIC PANEL
ALT: 9 U/L (ref 0–44)
AST: 33 U/L (ref 15–41)
Albumin: 2.7 g/dL — ABNORMAL LOW (ref 3.5–5.0)
Alkaline Phosphatase: 2090 U/L — ABNORMAL HIGH (ref 38–126)
Anion gap: 8 (ref 5–15)
BUN: 9 mg/dL (ref 6–20)
CO2: 21 mmol/L — ABNORMAL LOW (ref 22–32)
Calcium: 8.6 mg/dL — ABNORMAL LOW (ref 8.9–10.3)
Chloride: 107 mmol/L (ref 98–111)
Creatinine, Ser: 0.44 mg/dL — ABNORMAL LOW (ref 0.61–1.24)
GFR calc Af Amer: >60 mL/min (ref >60)
GFR calc non Af Amer: >60 mL/min (ref >60)
Glucose, Bld: 97 mg/dL (ref 70–99)
Potassium: 3.1 mmol/L — ABNORMAL LOW (ref 3.5–5.1)
Sodium: 136 mmol/L (ref 135–145)
Total Bilirubin: 0.7 mg/dL (ref 0.3–1.2)
Total Protein: 6.1 g/dL — ABNORMAL LOW (ref 6.5–8.1)

## 2019-08-04 LAB — URINALYSIS, ROUTINE W REFLEX MICROSCOPIC
Bilirubin Urine: NEGATIVE
Glucose, UA: NEGATIVE mg/dL
Hgb urine dipstick: NEGATIVE
Ketones, ur: 5 mg/dL — AB
Leukocytes,Ua: NEGATIVE
Nitrite: NEGATIVE
Protein, ur: NEGATIVE mg/dL
Specific Gravity, Urine: 1.024 (ref 1.005–1.030)
pH: 6 (ref 5.0–8.0)

## 2019-08-04 LAB — SARS CORONAVIRUS 2 (TAT 6-24 HRS): SARS Coronavirus 2: NEGATIVE

## 2019-08-04 LAB — MAGNESIUM: Magnesium: 2.2 mg/dL (ref 1.7–2.4)

## 2019-08-04 MED ORDER — HYDROMORPHONE HCL 1 MG/ML IJ SOLN
1.0000 mg | Freq: Once | INTRAMUSCULAR | Status: AC
  Administered 2019-08-04: 06:00:00 1 mg via INTRAVENOUS
  Filled 2019-08-04: qty 1

## 2019-08-04 MED ORDER — OXYCODONE-ACETAMINOPHEN 5-325 MG PO TABS
1.0000 | ORAL_TABLET | ORAL | Status: DC | PRN
  Administered 2019-08-04 – 2019-08-05 (×2): 1 via ORAL
  Filled 2019-08-04 (×2): qty 1

## 2019-08-04 MED ORDER — LEVETIRACETAM IN NACL 500 MG/100ML IV SOLN
500.0000 mg | Freq: Two times a day (BID) | INTRAVENOUS | Status: DC
  Administered 2019-08-04 (×3): 500 mg via INTRAVENOUS
  Filled 2019-08-04 (×4): qty 100

## 2019-08-04 MED ORDER — ATENOLOL 50 MG PO TABS
100.0000 mg | ORAL_TABLET | Freq: Every day | ORAL | Status: DC
  Administered 2019-08-04 – 2019-08-05 (×2): 100 mg via ORAL
  Filled 2019-08-04 (×2): qty 2

## 2019-08-04 MED ORDER — ACETAMINOPHEN 325 MG PO TABS: 650.0000 mg | ORAL_TABLET | Freq: Four times a day (QID) | ORAL | Status: DC | PRN

## 2019-08-04 MED ORDER — VITAMIN B-1 100 MG PO TABS
100.0000 mg | ORAL_TABLET | Freq: Every day | ORAL | Status: DC
  Administered 2019-08-04 – 2019-08-05 (×2): 100 mg via ORAL
  Filled 2019-08-04 (×2): qty 1

## 2019-08-04 MED ORDER — HYDRALAZINE HCL 25 MG PO TABS: 25.0000 mg | ORAL_TABLET | Freq: Three times a day (TID) | ORAL | Status: DC | PRN

## 2019-08-04 MED ORDER — ENOXAPARIN SODIUM 40 MG/0.4ML ~~LOC~~ SOLN
40.0000 mg | Freq: Every day | SUBCUTANEOUS | Status: DC
  Administered 2019-08-04: 10:00:00 40 mg via SUBCUTANEOUS
  Filled 2019-08-04 (×2): qty 0.4

## 2019-08-04 MED ORDER — ABIRATERONE ACETATE 250 MG PO TABS
1000.0000 mg | ORAL_TABLET | Freq: Every day | ORAL | Status: DC
  Administered 2019-08-04: 11:00:00 1000 mg via ORAL

## 2019-08-04 MED ORDER — POTASSIUM CHLORIDE CRYS ER 20 MEQ PO TBCR
40.0000 meq | EXTENDED_RELEASE_TABLET | Freq: Once | ORAL | Status: DC
  Filled 2019-08-04: qty 2

## 2019-08-04 MED ORDER — POTASSIUM CHLORIDE CRYS ER 20 MEQ PO TBCR: 20.0000 meq | EXTENDED_RELEASE_TABLET | Freq: Two times a day (BID) | ORAL | 0 refills | Status: DC

## 2019-08-04 MED ORDER — MAGNESIUM SULFATE IN D5W 1-5 GM/100ML-% IV SOLN
1.0000 g | Freq: Once | INTRAVENOUS | Status: AC
  Administered 2019-08-04: 04:00:00 1 g via INTRAVENOUS
  Filled 2019-08-04: qty 100

## 2019-08-04 MED ORDER — ACETAMINOPHEN 650 MG RE SUPP: 650.0000 mg | Freq: Four times a day (QID) | RECTAL | Status: DC | PRN

## 2019-08-04 MED ORDER — HYDROMORPHONE HCL 1 MG/ML IJ SOLN
1.0000 mg | Freq: Once | INTRAMUSCULAR | Status: AC
  Administered 2019-08-04: 1 mg via INTRAVENOUS
  Filled 2019-08-04: qty 1

## 2019-08-04 MED ORDER — THIAMINE HCL 100 MG/ML IJ SOLN: 100.0000 mg | Freq: Every day | INTRAMUSCULAR | Status: DC

## 2019-08-04 MED ORDER — OXYCODONE HCL 5 MG PO TABS
5.0000 mg | ORAL_TABLET | ORAL | Status: DC | PRN
  Administered 2019-08-04 – 2019-08-05 (×3): 5 mg via ORAL
  Filled 2019-08-04 (×3): qty 1

## 2019-08-04 MED ORDER — POTASSIUM CHLORIDE CRYS ER 20 MEQ PO TBCR
40.0000 meq | EXTENDED_RELEASE_TABLET | Freq: Every day | ORAL | Status: AC
  Administered 2019-08-04: 13:00:00 40 meq via ORAL
  Filled 2019-08-04: qty 2

## 2019-08-04 MED ORDER — POTASSIUM CHLORIDE CRYS ER 20 MEQ PO TBCR
40.0000 meq | EXTENDED_RELEASE_TABLET | Freq: Once | ORAL | Status: AC
  Administered 2019-08-04: 40 meq via ORAL
  Filled 2019-08-04: qty 2

## 2019-08-04 MED ORDER — ADULT MULTIVITAMIN W/MINERALS CH
1.0000 | ORAL_TABLET | Freq: Every day | ORAL | Status: DC
  Administered 2019-08-04 – 2019-08-05 (×2): 1 via ORAL
  Filled 2019-08-04 (×2): qty 1

## 2019-08-04 MED ORDER — ONDANSETRON HCL 4 MG/2ML IJ SOLN
4.0000 mg | Freq: Four times a day (QID) | INTRAMUSCULAR | Status: DC | PRN
  Administered 2019-08-04 (×3): 4 mg via INTRAVENOUS
  Filled 2019-08-04 (×3): qty 2

## 2019-08-04 MED ORDER — ONDANSETRON HCL 4 MG PO TABS: 4.0000 mg | ORAL_TABLET | Freq: Four times a day (QID) | ORAL | Status: DC | PRN

## 2019-08-04 MED ORDER — PREDNISONE 5 MG PO TABS
5.0000 mg | ORAL_TABLET | Freq: Every day | ORAL | Status: DC
  Administered 2019-08-04 – 2019-08-05 (×2): 5 mg via ORAL
  Filled 2019-08-04 (×2): qty 1

## 2019-08-04 MED ORDER — BOOST / RESOURCE BREEZE PO LIQD CUSTOM
1.0000 | Freq: Three times a day (TID) | ORAL | Status: DC
  Administered 2019-08-04: 16:00:00 1 via ORAL

## 2019-08-04 MED ORDER — POLYETHYLENE GLYCOL 3350 17 G PO PACK: 17.0000 g | PACK | Freq: Every day | ORAL | Status: DC | PRN

## 2019-08-04 MED ORDER — MORPHINE SULFATE ER 30 MG PO TBCR
60.0000 mg | EXTENDED_RELEASE_TABLET | Freq: Two times a day (BID) | ORAL | Status: DC
  Administered 2019-08-04 – 2019-08-05 (×4): 60 mg via ORAL
  Filled 2019-08-04 (×3): qty 2
  Filled 2019-08-04: qty 4

## 2019-08-04 MED ORDER — DM-GUAIFENESIN ER 30-600 MG PO TB12: 1.0000 | ORAL_TABLET | Freq: Two times a day (BID) | ORAL | Status: DC | PRN

## 2019-08-04 MED ORDER — MIRTAZAPINE 15 MG PO TABS
15.0000 mg | ORAL_TABLET | Freq: Every day | ORAL | Status: DC
  Administered 2019-08-04: 22:00:00 15 mg via ORAL
  Filled 2019-08-04: qty 1

## 2019-08-04 MED ORDER — FOLIC ACID 1 MG PO TABS
1.0000 mg | ORAL_TABLET | Freq: Every day | ORAL | Status: DC
  Administered 2019-08-04 – 2019-08-05 (×2): 1 mg via ORAL
  Filled 2019-08-04 (×2): qty 1

## 2019-08-04 MED ORDER — LORAZEPAM 2 MG/ML IJ SOLN: 1.0000 mg | INTRAMUSCULAR | Status: DC | PRN

## 2019-08-04 MED ORDER — SODIUM CHLORIDE 0.9 % IV SOLN
INTRAVENOUS | Status: DC
  Administered 2019-08-04 – 2019-08-05 (×2): via INTRAVENOUS

## 2019-08-04 MED ORDER — ALBUTEROL SULFATE (2.5 MG/3ML) 0.083% IN NEBU: 2.5000 mg | INHALATION_SOLUTION | RESPIRATORY_TRACT | Status: DC | PRN

## 2019-08-04 MED ORDER — KETOROLAC TROMETHAMINE 30 MG/ML IJ SOLN
15.0000 mg | Freq: Once | INTRAMUSCULAR | Status: AC
  Administered 2019-08-04: 01:00:00 15 mg via INTRAVENOUS
  Filled 2019-08-04: qty 1

## 2019-08-04 MED ORDER — GABAPENTIN 300 MG PO CAPS
300.0000 mg | ORAL_CAPSULE | Freq: Three times a day (TID) | ORAL | Status: DC
  Administered 2019-08-04 – 2019-08-05 (×4): 300 mg via ORAL
  Filled 2019-08-04 (×4): qty 1

## 2019-08-04 MED ORDER — NICOTINE 21 MG/24HR TD PT24
21.0000 mg | MEDICATED_PATCH | Freq: Every day | TRANSDERMAL | Status: DC
  Administered 2019-08-04: 21 mg via TRANSDERMAL
  Filled 2019-08-04 (×2): qty 1

## 2019-08-04 MED ORDER — OXYCODONE-ACETAMINOPHEN 10-325 MG PO TABS: 1.0000 | ORAL_TABLET | ORAL | Status: DC | PRN

## 2019-08-04 MED ORDER — ONDANSETRON 8 MG PO TBDP: ORAL_TABLET | ORAL | 0 refills | Status: AC

## 2019-08-04 MED ORDER — HYDROCORTISONE NA SUCCINATE PF 100 MG IJ SOLR
50.0000 mg | Freq: Once | INTRAMUSCULAR | Status: AC
  Administered 2019-08-04: 06:00:00 50 mg via INTRAVENOUS
  Filled 2019-08-04: qty 2

## 2019-08-04 MED ORDER — VALACYCLOVIR HCL 500 MG PO TABS
500.0000 mg | ORAL_TABLET | Freq: Every day | ORAL | Status: DC
  Administered 2019-08-04 – 2019-08-05 (×2): 500 mg via ORAL
  Filled 2019-08-04 (×2): qty 1

## 2019-08-04 NOTE — Progress Notes (Signed)
Patient seen and examined personally, I reviewed the chart, history and physical and admission note, done by admitting physician this morning and agree with the same with following addendum.  Please refer to the morning admission note for more detailed plan of care.  Briefly, per H&P:53 y.o. 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  Patient states that he ran out of his pain medication in the past several days, developed worsening pain in lower back and bilateral legs.  Pain is constant, sharp, severe, nonradiating.  Patient reports mild cough and mild shortness of breath due to COPD, no chest pain, fever or chills.  He has nausea and vomited many times with nonbilious nonbloody vomitus. He states that he vomits almost every time when he eats foods. He had mild diarrhea yesterday, but not today.  No abdominal pain.  He states that he stopped drinking alcohol several months ago.  No symptoms of UTI.  Patient has generalized weakness, no facial droop or slurred speech.  ED Course: pt was found to have WBC 8.4, lipase 21, negative urinalysis, pending COVID-19 test, potassium 2.9, renal function normal, ALP 2544, normal AST, ALT and total bilirubin.  Temperature 99.2, blood pressure 129/79, heart rate 80, oxygen saturation 9200% on room air.  He was admitted and being managed conservatively.  Seen this morning was complaining of nausea.  T-max 99.3.  Potassium is still low at 3.1 Has lower back and bilateral leg pain.  Vitals:   08/04/19 0304 08/04/19 0400 08/04/19 0535 08/04/19 1336  BP: 127/74 139/67 117/71 122/76  Pulse: 80 85 79 83  Resp: 16 16 16 18   Temp:   99.3 F (37.4 C) 98.5 F (36.9 C)  TempSrc:   Oral Oral  SpO2: 100% 100% 99% 100%  Weight:   76.9 kg    Height:   6\' 1"  (1.854 m)     issues being addressed:  Intractable nausea/vomiting unclear etiology, seems to be improving.  Continue diet and make sure he is tolerating regular diet, continue IV fluid support for now.  Intractable pain associated with cancer, pain worsened by running out of his MS Contin and Percocet.  Continue home regimen.  Reports his prescription will be ready for pickup by tomorrow.  Hypokalemia, potassium still low despite aggressive replacement ordered for replacement and repeat chemistry panel in the morning.  Anemia of chronic disease likely from cancer.  Monitor.  Hypoalbuminemia likely in the setting of cancer 11-2.7 g.  Augment nutrition.  Metabolic acidosis bicarbonate 21, likely in the setting of intractable nausea vomiting, decreased po intake. Continue IV fluids.  Deconditioning/Debility/chronic pain, PT OT consulted for disposition.  Lives with girlfriend who works.  Poor po intake- endorses low appetite add remeron bedtime  Other issues, please, refer to HPI includes Polysubstance abuse/tobacco abuse/alcohol abuse not drinking several months, no signs of withdrawal, UDS pending. COPD/chronic bronchitis Seizure disorder on Keppra: On IV here, switch to p.o in am Prostate cancer with mets to bone got IV steroid for stress test, continue home prednisone, abiraterone. Dispo he will need at least 2 midnight stay to manage his symptoms, poor intake, nausea- unreliable po, at risk for dehydration/AKI. Anticipate d/c in am if stable.

## 2019-08-04 NOTE — Progress Notes (Signed)
Patient had one small episode of vomiting this morning. He has experienced more dry heaves than actually vomiting. Roy Rosales sat up in the chair for lunch and had a visitor this afternoon. He is planning on going home tomorrow.

## 2019-08-04 NOTE — Progress Notes (Signed)
Initial Nutrition Assessment  INTERVENTION:   -Boost Breeze po TID, each supplement provides 250 kcal and 9 grams of protein  NUTRITION DIAGNOSIS:   Increased nutrient needs related to cancer and cancer related treatments as evidenced by estimated needs.  GOAL:   Patient will meet greater than or equal to 90% of their needs  MONITOR:   PO intake, Supplement acceptance, Labs, Weight trends, I & O's  REASON FOR ASSESSMENT:   Malnutrition Screening Tool    ASSESSMENT:   53 y.o. 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  **RD working remotely**  Patient reports poor appetite and poor PO for the past few days related to increased pain and nausea. Pt ran out of his pain and nausea medications. Pt with history of ETOH abuse but states he has not drank ETOH for several months now.  Pt consumed 20% of breakfast this morning, no further N/V today.  Will order Boost Breeze supplements for additional kcal and protein.  Per weight records, pt has lost 21 lbs since 6/30 (11% wt loss x 3 months, significant for time frame).  I/Os: +5L since admit  Medications: Folic acid tablet daily, Multivitamin with minerals daily, KLOR-CON, Thiamine tablet, IV Mg sulfate, IV Zofran PRN Labs reviewed: Low K  NUTRITION - FOCUSED PHYSICAL EXAM:  Unable to perform -working remotely.  Diet Order:   Diet Order            Diet Heart Room service appropriate? Yes; Fluid consistency: Thin  Diet effective now              EDUCATION NEEDS:   No education needs have been identified at this time  Skin:  Skin Assessment: Reviewed RN Assessment  Last BM:  10/4  Height:   Ht Readings from Last 1 Encounters:  08/04/19 6\' 1"  (1.854 m)    Weight:   Wt Readings  from Last 1 Encounters:  08/04/19 76.9 kg    Ideal Body Weight:  83.6 kg  BMI:  Body mass index is 22.37 kg/m.  Estimated Nutritional Needs:   Kcal:  2100-2300  Protein:  105-115g  Fluid:  2.1L/day  Clayton Bibles, MS, RD, LDN Inpatient Clinical Dietitian Pager: [phone] After Hours Pager: [phone]

## 2019-08-04 NOTE — Evaluation (Signed)
Occupational Therapy Evaluation Patient Details Name: Roy Rosales MRN: [id number] DOB: 1965-12-27 Today's Date: 08/04/2019    History of Present Illness Roy Rosales is a 53 y.o. 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   Clinical Impression   Pt admitted with the  Above which was affecting his ability to perform his ADL activity. Pt currently with functional limitations due to the deficits listed below (see OT Problem List).  Pt will benefit from skilled OT to increase their safety and independence with ADL and functional mobility for ADL to facilitate discharge to venue listed below.      Follow Up Recommendations  Home health OT;Supervision/Assistance - 24 hour    Equipment Recommendations  3 in 1 bedside commode       Precautions / Restrictions Precautions Precautions: Fall      Mobility Bed Mobility Overal bed mobility: Needs Assistance Bed Mobility: Sit to Supine       Sit to supine: Mod assist      Transfers Overall transfer level: Needs assistance Equipment used: Rolling walker (2 wheeled) Transfers: Sit to/from [facility] Sit to Stand: Min assist Stand pivot transfers: Min assist            Balance Overall balance assessment: Needs assistance Sitting-balance support: No upper extremity supported Sitting balance-Leahy Scale: Good     Standing balance support: Bilateral upper extremity supported Standing balance-Leahy Scale: Poor                             ADL either performed or assessed with clinical judgement   ADL Overall ADL's : Needs assistance/impaired Eating/Feeding: Set up;Sitting   Grooming: Set up;Sitting   Upper Body Bathing: Set up;Sitting   Lower Body Bathing:  Moderate assistance;Sit to/from stand;Cueing for safety;Cueing for sequencing   Upper Body Dressing : Set up;Sitting   Lower Body Dressing: Moderate assistance;Cueing for sequencing;Cueing for safety   Toilet Transfer: Minimal assistance;BSC;RW   Toileting- Clothing Manipulation and Hygiene: Moderate assistance;Cueing for compensatory techniques;Cueing for sequencing;Sit to/from stand               Vision Patient Visual Report: No change from baseline              Pertinent Vitals/Pain Pain Assessment: (P) 0-10 Pain Score: (P) 3  Pain Location: (P) back     Hand Dominance Right   Extremity/Trunk Assessment Upper Extremity Assessment Upper Extremity Assessment: Generalized weakness           Communication Communication Communication: No difficulties   Cognition Arousal/Alertness: Awake/alert Behavior During Therapy: WFL for tasks assessed/performed Overall Cognitive Status: Within Functional Limits for tasks assessed                                                Home Living Family/patient expects to be discharged to:: Private residence Living Arrangements: Spouse/significant other;Children Available Help at Discharge: Family;Available PRN/intermittently Type of Home: House Home Access: Stairs to enter [employer] of Steps: 4 step at front and 1 at the back Entrance Stairs-Rails: None Home Layout: One level     Bathroom Shower/Tub: Tub/shower unit   [employer]  Toilet: Standard Bathroom Accessibility: Yes   Home Equipment: Clinical cytogeneticist - 2 wheels;Cane - single point;Bedside commode   Additional Comments: his girlfriend helps him, 4 months ago fell in tub waler is hard to get through bathroom door      Prior Functioning/Environment Level of Independence: Needs assistance  Gait / Transfers Assistance Needed: uses RW at home for 50-75' distances ADL's / Homemaking Assistance Needed: pt needs assistance for dressing  abd bathign from his girlfriend   Comments: gravel and grass to get into back door; no family can be with him in the daytime, his girl preps food bf she leave for work coems home at 1-2, sometimes it is later 16 or 30, 55 yo son goes to daycare        OT Problem List: Decreased strength;Decreased activity tolerance;Impaired balance (sitting and/or standing);Decreased knowledge of use of DME or AE;Decreased knowledge of precautions      OT Treatment/Interventions: Self-care/ADL training;DME and/or AE instruction;Patient/family education    OT Goals(Current goals can be found in the care plan section) Acute Rehab OT Goals Patient Stated Goal: get stronger OT Goal Formulation: With patient Time For Goal Achievement: 08/11/19  OT Frequency: Min 2X/week    AM-PAC OT "6 Clicks" Daily Activity     Outcome Measure Help from another person eating meals?: None Help from another person taking care of personal grooming?: None Help from another person toileting, which includes using toliet, bedpan, or urinal?: A Little Help from another person bathing (including washing, rinsing, drying)?: A Lot Help from another person to put on and taking off regular upper body clothing?: A Little Help from another person to put on and taking off regular lower body clothing?: A Lot 6 Click Score: 18   End of Session Equipment Utilized During Treatment: Rolling walker;Gait belt Nurse Communication: Mobility status  Activity Tolerance: Patient limited by fatigue Patient left: in bed;with call bell/phone within reach  OT Visit Diagnosis: Muscle weakness (generalized) (M62.81);Unsteadiness on feet (R26.81)                Time: 0130-0150 OT Time Calculation (min): 20 min Charges:  OT General Charges $OT Visit: 1 Visit OT Evaluation $OT Eval Moderate Complexity: 1 Mod  Kari Baars, [place] [phone] Office- [phone], Edwena Felty D 08/04/2019, 5:45 PM

## 2019-08-04 NOTE — ED Notes (Signed)
Pt transported to XR.  

## 2019-08-04 NOTE — H&P (Addendum)
History and Physical    Roy Rosales [id number] DOB: 1965-11-17 DOA: 08/03/2019  Referring MD/NP/PA:   PCP: System, Pcp Not In   Patient coming from:  The patient is coming from home.  At baseline, pt is independent for most of ADL.        Chief Complaint: Back pain, bilateral leg pain, nausea, vomiting  HPI: Roy Rosales is a 53 y.o. 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  Patient states that he ran out of his pain medication in the past several days, developed worsening pain in lower back and bilateral legs.  Pain is constant, sharp, severe, nonradiating.  Patient reports mild cough and mild shortness of breath due to COPD, no chest pain, fever or chills.  He has nausea and vomited many times with nonbilious nonbloody vomitus. He states that he vomits almost every time when he eats foods. He had mild diarrhea yesterday, but not today.  No abdominal pain.  He states that he stopped drinking alcohol several months ago.  No symptoms of UTI.  Patient has generalized weakness, no facial droop or slurred speech.  ED Course: pt was found to have WBC 8.4, lipase 21, negative urinalysis, pending COVID-19 test, potassium 2.9, renal function normal, ALP 2544, normal AST, ALT and total bilirubin.  Temperature 99.2, blood pressure 129/79, heart rate 80, oxygen saturation 9200% on room air.  Review of Systems:   General: no fevers, chills, no body weight gain, has poor appetite, has fatigue and generalized weakness. HEENT: no blurry vision, hearing changes or sore throat Respiratory: has dyspnea, coughing, no wheezing CV: no chest pain, no palpitations GI: has nausea, vomiting, no abdominal pain, diarrhea, constipation GU: no dysuria, burning on urination,  increased urinary frequency, hematuria  Ext: no leg edema Neuro: no unilateral weakness, numbness, or tingling, no vision change or hearing loss Skin: no rash, no skin tear. MSK: Has diffused leg pain and lower back pain Heme: No easy bruising.  Travel history: No recent long distant travel.  Allergy: No Known Allergies  Past Medical History:  Diagnosis Date  . COPD (chronic obstructive pulmonary disease) ([place])   . Hypertension   . Polysubstance abuse ([place])   . Prostate cancer [facility])     Past Surgical History:  Procedure Laterality Date  . IR RADIOLOGIST EVAL & MGMT  01/15/2019  . NO PAST SURGERIES    . PROSTATE BIOPSY      Social History:  reports that he has been smoking cigarettes. He has been smoking about 1.00 pack per day. He has never used smokeless tobacco. He reports current alcohol use. He reports current drug use. Drugs: Marijuana and PCP.  Family History:  Family History  Problem Relation Age of Onset  . Breast cancer Mother   . Hypertension Mother   . Cancer Sister   . Prostate cancer Father   . Prostate cancer Brother   . Stomach cancer Brother   . Seizures Neg Hx      Prior to Admission medications   Medication Sig Start Date End Date Taking? Authorizing Provider  abiraterone acetate (ZYTIGA) 250 MG tablet TAKE 4 TABLETS (1,000 MG TOTAL) BY MOUTH DAILY. TAKE ON AN EMPTY STOMACH 1 HOUR BEFORE OR 2 HOURS AFTER A MEAL 07/16/19  Yes Wyatt Portela, MD  atenolol (TENORMIN) 100 MG  tablet Take 1 tablet (100 mg total) by mouth daily. 11/20/18  Yes Elsie Stain, MD  gabapentin (NEURONTIN) 300 MG capsule Take 1 capsule (300 mg total) by mouth 3 (three) times daily. Start with 1 pill at bedtime for the first week then increase if tolerated 01/01/19  Yes Fulp, Cammie, MD  levETIRAcetam (KEPPRA) 500 MG tablet Take 1 tablet (500 mg total) by mouth 2 (two) times daily. 11/20/18  Yes Elsie Stain, MD  morphine (MS CONTIN) 60 MG 12 hr tablet Take 1 tablet (60 mg total)  by mouth every 12 (twelve) hours. 08/01/19  Yes Wyatt Portela, MD  oxyCODONE-acetaminophen (PERCOCET) 10-325 MG tablet Take 1 tablet by mouth every 4 (four) hours as needed for pain. 08/01/19  Yes Wyatt Portela, MD  polyethylene glycol powder (GLYCOLAX/MIRALAX) powder Take 17 g by mouth daily. Mixed with at least 8 ounces of water to treat constipation Patient taking differently: Take 17 g by mouth daily as needed for mild constipation. Mixed with at least 8 ounces of water to treat constipation 01/01/19  Yes Fulp, Cammie, MD  potassium chloride SA (K-DUR) 20 MEQ tablet Take 2 tablets (40 mEq total) by mouth daily. 04/17/19  Yes Wyatt Portela, MD  predniSONE (DELTASONE) 5 MG tablet Take 1 tablet (5 mg total) by mouth daily with breakfast. 05/30/19  Yes Shadad, Mathis Dad, MD  prochlorperazine (COMPAZINE) 10 MG tablet Take 1 tablet (10 mg total) by mouth every 6 (six) hours as needed for nausea or vomiting. 05/16/19  Yes Wyatt Portela, MD  triamterene-hydrochlorothiazide (MAXZIDE) 75-50 MG tablet Take 1 tablet by mouth daily. 11/20/18  Yes Elsie Stain, MD  valACYclovir (VALTREX) 500 MG tablet Take 500 mg by mouth daily. 09/19/18  Yes [provider]  albuterol (PROVENTIL HFA;VENTOLIN HFA) 108 (90 Base) MCG/ACT inhaler Inhale 2 puffs into the lungs every 4 (four) hours as needed. Patient taking differently: Inhale 2 puffs into the lungs every 4 (four) hours as needed for wheezing or shortness of breath.  11/20/18   Elsie Stain, MD  celecoxib (CELEBREX) 100 MG capsule Take 1 capsule (100 mg total) by mouth 2 (two) times daily. Eat before medication Patient not taking: Reported on 08/03/2019 03/21/19   Fulp, Ander Gaster, MD  diazepam (DIASTAT ACUDIAL) 10 MG GEL Place 10 mg rectally once as needed for up to 1 dose for seizure. Patient not taking: Reported on 08/03/2019 09/07/18   Orpah Greek, MD  doxycycline (VIBRA-TABS) 100 MG tablet Take 1 tablet (100 mg total) by mouth 2 (two) times  daily. After eating to treat bronchitis Patient not taking: Reported on 08/03/2019 01/01/19   [name], MD  ondansetron (ZOFRAN ODT) 8 MG disintegrating tablet 8mg  ODT q8 hours prn nausea 08/04/19   Palumbo, April, MD  ondansetron (ZOFRAN) 8 MG tablet Take 1 tablet (8 mg total) by mouth every 8 (eight) hours as needed for nausea or vomiting (nausea). 02/13/19   Wyatt Portela, MD  potassium chloride SA (KLOR-CON) 20 MEQ tablet Take 1 tablet (20 mEq total) by mouth 2 (two) times daily. 08/04/19   Palumbo, April, MD    Physical Exam: Vitals:   08/03/19 2330 08/04/19 0000 08/04/19 0030 08/04/19 0304  BP: 120/66 126/72 129/79 127/74  Pulse: 77 88 80 80  Resp:   15 16  Temp:      TempSrc:      SpO2: 95% 100% 100% 100%  Weight:      Height:  General: Not in acute distress HEENT:       Eyes: PERRL, EOMI, no scleral icterus.       ENT: No discharge from the ears and nose, no pharynx injection, no tonsillar enlargement.        Neck: No JVD, no bruit, no mass felt. Heme: No neck lymph node enlargement. Cardiac: S1/S2, RRR, No murmurs, No gallops or rubs. Respiratory: No rales, wheezing, rhonchi or rubs. GI: Soft, nondistended, nontender, no rebound pain, no organomegaly, BS present. GU: No hematuria Ext: No pitting leg edema bilaterally. 2+DP/PT pulse bilaterally. Musculoskeletal: Has tenderness in both leg pain and lower back pain Skin: No rashes.  Neuro: Alert, oriented X3, cranial nerves II-XII grossly intact, moves all extremities normally.  Psych: Patient is not psychotic, no suicidal or hemocidal ideation.  Labs on Admission: I have personally reviewed following labs and imaging studies  CBC: Recent Labs  Lab 08/03/19 2131  WBC 8.4  NEUTROABS 6.7  HGB 9.2*  HCT 30.3*  MCV 80.8  PLT [id number]*   Basic Metabolic Panel: Recent Labs  Lab 08/03/19 2131  NA 133*  K 2.9*  CL 101  CO2 21*  GLUCOSE 98  BUN 8  CREATININE 0.37*  CALCIUM 8.8*   GFR: Estimated Creatinine  Clearance: 120.7 mL/min (A) (by C-G formula based on SCr of 0.37 mg/dL (L)). Liver Function Tests: Recent Labs  Lab 08/03/19 2131  AST 39  ALT 10  ALKPHOS 2,544*  BILITOT 0.7  PROT 6.9  ALBUMIN 3.1*   Recent Labs  Lab 08/03/19 2131  LIPASE 22   No results for input(s): AMMONIA in the last 168 hours. Coagulation Profile: No results for input(s): INR, PROTIME in the last 168 hours. Cardiac Enzymes: No results for input(s): CKTOTAL, CKMB, CKMBINDEX, TROPONINI in the last 168 hours. BNP (last 3 results) No results for input(s): PROBNP in the last 8760 hours. HbA1C: No results for input(s): HGBA1C in the last 72 hours. CBG: No results for input(s): GLUCAP in the last 168 hours. Lipid Profile: No results for input(s): CHOL, HDL, LDLCALC, TRIG, CHOLHDL, LDLDIRECT in the last 72 hours. Thyroid Function Tests: No results for input(s): TSH, T4TOTAL, FREET4, T3FREE, THYROIDAB in the last 72 hours. Anemia Panel: No results for input(s): VITAMINB12, FOLATE, FERRITIN, TIBC, IRON, RETICCTPCT in the last 72 hours. Urine analysis:    Component Value Date/Time   COLORURINE YELLOW 08/04/2019 0152   APPEARANCEUR CLEAR 08/04/2019 0152   LABSPEC 1.024 08/04/2019 0152   PHURINE 6.0 08/04/2019 0152   GLUCOSEU NEGATIVE 08/04/2019 0152   HGBUR NEGATIVE 08/04/2019 0152   BILIRUBINUR NEGATIVE 08/04/2019 0152   BILIRUBINUR negative 11/21/2018 1118   KETONESUR 5 (A) 08/04/2019 0152   PROTEINUR NEGATIVE 08/04/2019 0152   UROBILINOGEN 0.2 11/21/2018 1118   NITRITE NEGATIVE 08/04/2019 0152   LEUKOCYTESUR NEGATIVE 08/04/2019 0152   Sepsis Labs: @LABRCNTIP (procalcitonin:4,lacticidven:4) )No results found for this or any previous visit (from the past 240 hour(s)).   Radiological Exams on Admission: Dg Lumbar Spine Complete  Result Date: 08/04/2019 CLINICAL DATA:  Back and bilateral hip pain EXAM: LUMBAR SPINE - COMPLETE 4+ VIEW COMPARISON:  None. FINDINGS: There is diffuse mottled sclerotic  appearance throughout the osseous structures. No definite fracture is seen. No malalignment. Scattered vascular calcifications are noted. IMPRESSION: Diffuse osseous metastatic disease throughout. No definite fracture. Electronically Signed   By: Prudencio Pair M.D.   On: 08/04/2019 02:32   Dg Hips Bilat W Or Wo Pelvis 3-4 Views  Result Date: 08/04/2019 CLINICAL DATA:  Back  and bilateral hip pain EXAM: DG HIP (WITH OR WITHOUT PELVIS) 3-4V BILAT COMPARISON:  May 26, 2019 FINDINGS: Again noted is diffuse mottled sclerotic appearance of the osseous structures. No fracture is identified. The SI joints appear to be grossly intact. Overlying bowel gas is seen in the deep pelvis. IMPRESSION: Diffuse osseous metastatic disease.  No definite acute fracture. Electronically Signed   By: Prudencio Pair M.D.   On: 08/04/2019 02:27     EKG:  Not done in ED, will get one.   Assessment/Plan   Intractable nausea and vomiting: Etiology is not clear, possibly related to metastasized prostate cancer.  Patient does not have abdominal pain.  No acute abdomen on physical examination.  -Placed MedSurg bed for observation -Supportive care - PRN Zofran for nausea -IV fluid: 2 L normal saline, then 100 cc/h  Cancer associated pain: -resume his home MS Contin and Percocet -pt/ot  Polysubstance abuse, tbacco abuse and Alcohol abuse: Patient states that he stopped drinking alcohol several month ago.  No signs of withdrawal. -Nicotine patch -Encouraged the patient not to drink alcohol again. -check UDS  Essential hypertension: Bp 129/79.  -Continue atenolol -Hold on Maxide since patient need IV fluid -prn hydralazine orally  Seizure -Seizure precaution -When necessary Ativan for seizure -Continue Home medications: Keppra --> switched to IV Keppra 500 mg due to intractable nausea vomiting.  COPD with chronic bronchitis ([place]): stable -prn albuterol and mucinex  Prostate cancer metastatic to bone [facility]): [place] bone  scan on 01/03/19 showed widespread bone metastasis.  Patient is follow-up with Dr. Alen Blew -Continue home Abiraterone and prednisone - give 50 mg of Solu-Cortef as stress dose -f/u with Dr. Alen Blew  Hypokalemia: K=2.9 on admission. - Repleted - Check Mg level - Give 1 g of magnesium sulfate   DVT ppx:  SQ Lovenox Code Status: Full code Family Communication: None at bed side.    Disposition Plan:  Anticipate discharge back to previous home environment Consults called: none Admission status:   medical floor/obs       Date of Service 08/04/2019    [facility]   If 7PM-7AM, please contact night-coverage [email or web address] Password [email or web address] 08/04/2019, 4:16 AM

## 2019-08-04 NOTE — Evaluation (Addendum)
Physical Therapy Evaluation Patient Details Name: Roy Rosales MRN: [id number] DOB: [birth year] Today's Date: 08/04/2019   History of Present Illness  Roy Rosales is a 53 y.o. 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    Clinical Impression  Roy Rosales is a 53 y.o. male admitted with above HPI. He is currently limited by functional impairments (see PT problem list below) and currently requires assist for all mobility. Patient uses RW at baseline for short household mobility and requires assistance for ADL's. He will benefit from skilled HHPT in home setting to increase safety and independence with functional mobility. Acute PT will follow to progress as able.    Follow Up Recommendations Home health PT    Equipment Recommendations  None recommended by PT    Recommendations for Other Services       Precautions / Restrictions Precautions Precautions: Fall Restrictions Weight Bearing Restrictions: No      Mobility  Bed Mobility Overal bed mobility: Needs Assistance Bed Mobility: Supine to Sit     Supine to sit: HOB elevated;Min assist Sit to supine: Mod assist   General bed mobility comments: cues for sequencing and to use bed rails to transfer in bed, assist to bring trunk upright  Transfers Overall transfer level: Needs assistance Equipment used: Rolling walker (2 wheeled) Transfers: Sit to/from Stand Sit to Stand: Min assist Stand pivot transfers: Min assist       General transfer comment: cues for safe hand placement and technique with RW, min assist to initiated power up and steady upon rising  Ambulation/Gait Ambulation/Gait assistance: Min assist Gait Distance (Feet): 80 Feet Assistive device: Rolling walker (2  wheeled) Gait Pattern/deviations: Step-through pattern;Decreased stride length;Decreased step length - left;Decreased step length - right;Decreased dorsiflexion - right;Decreased dorsiflexion - left;Wide base of support;Trunk flexed Gait velocity: decreased   General Gait Details: pt requried cues for safe hand placement initially and maintained throughout, he required intermittent cues to maintain closer safe proximity to RW, no overt LOB thorughout. Pt with limited hip/knee flexion and poor foot clearance during gait  Stairs            Wheelchair Mobility    Modified Rankin (Stroke Patients Only)       Balance Overall balance assessment: Needs assistance Sitting-balance support: No upper extremity supported;Feet supported Sitting balance-Leahy Scale: Good     Standing balance support: Bilateral upper extremity supported;During functional activity Standing balance-Leahy Scale: Poor Standing balance comment: pt reliant on UE support            Pertinent Vitals/Pain Pain Assessment: 0-10 Pain Score: 9  Pain Location: butt and back pain, Rt worse than left Pain Descriptors / Indicators: Aching;Sore Pain Intervention(s): Limited activity within patient's tolerance;Monitored during session;Repositioned    Home Living Family/patient expects to be discharged to:: Private residence Living Arrangements: Spouse/significant other;Children Available Help at Discharge: Family;Available PRN/intermittently(pt's girlfriend works and his 30 y.o. son goes to daycare during the day) Type of Home: House Home Access: Stairs to enter Entrance Stairs-Rails: None Entrance Stairs-Number of Steps: 4 step at front and 1 at the back [place]: One level Home Equipment: Clinical cytogeneticist - 2 wheels;Cane - single point;Bedside commode Additional Comments: pt's girlfriend helps with with ADL's    Prior Function Level of Independence: Needs assistance   Gait / Transfers  Assistance Needed:  uses RW at home for 50-75' distances  ADL's / Homemaking Assistance Needed: pt needs assistance for dressing and bathing from his girlfriend  Comments: pt reports he fell ~ 4 months ago in the tub and that is when he noticed he was having more difficulty getting around and became weaker. He has no steps to get into home at back door however has to get across gravel and grass. Pt's reports his girlfriend preps meals for him during the day and typically gets home between 1-2 but it can be as late as 5-6.     Hand Dominance   Dominant Hand: Right    Extremity/Trunk Assessment   Upper Extremity Assessment Upper Extremity Assessment: Defer to OT evaluation    Lower Extremity Assessment Lower Extremity Assessment: Generalized weakness    Cervical / Trunk Assessment Cervical / Trunk Assessment: Normal  Communication   Communication: No difficulties  Cognition Arousal/Alertness: Awake/alert Behavior During Therapy: WFL for tasks assessed/performed Overall Cognitive Status: Within Functional Limits for tasks assessed           General Comments      Exercises     Assessment/Plan    PT Assessment Patient needs continued PT services  PT Problem List Decreased strength;Decreased range of motion;Decreased activity tolerance;Decreased mobility;Pain;Decreased balance;Decreased knowledge of use of DME       PT Treatment Interventions DME instruction;Gait training;Stair training;Functional mobility training;Modalities;Patient/family education;Balance training;Therapeutic exercise;Therapeutic activities    PT Goals (Current goals can be found in the Care Plan section)  Acute Rehab PT Goals Patient Stated Goal: get stronger    Frequency Min 3X/week    AM-PAC PT "6 Clicks" Mobility  Outcome Measure Help needed turning from your back to your side while in a flat bed without using bedrails?: A Little Help needed moving from lying on your back to sitting on the side of a flat bed  without using bedrails?: A Little Help needed moving to and from a bed to a chair (including a wheelchair)?: A Little Help needed standing up from a chair using your arms (e.g., wheelchair or bedside chair)?: A Little Help needed to walk in hospital room?: A Little Help needed climbing 3-5 steps with a railing? : A Little 6 Click Score: 18    End of Session Equipment Utilized During Treatment: Gait belt Activity Tolerance: Patient tolerated treatment well Patient left: in bed Nurse Communication: Mobility status PT Visit Diagnosis: Unsteadiness on feet (R26.81);Muscle weakness (generalized) (M62.81);Difficulty in walking, not elsewhere classified (R26.2)    Time: [id number] PT Time Calculation (min) (ACUTE ONLY): 16 min   Charges:   PT Evaluation $PT Eval Moderate Complexity: 1 Mod         Kipp Brood, PT, DPT, [facility] Physical Therapist with [facility]  08/04/2019 6:47 PM

## 2019-08-04 NOTE — ED Notes (Signed)
Pt tolerating PO without vomiting

## 2019-08-04 NOTE — ED Notes (Signed)
Urinal at patient's bedside. He has been asked for a urine sample.

## 2019-08-04 NOTE — ED Notes (Signed)
ED TO INPATIENT HANDOFF REPORT  ED Nurse Name and Phone #: Fidela Juneau Name/Age/Gender Roy Rosales 53 y.o. male Room/Bed: WA22/WA22  Code Status   Code Status: Prior  Home/SNF/Other Home Patient oriented to: self, place, time and situation Is this baseline? Yes   Triage Complete: Triage complete  Chief Complaint Oncology Patient with Back Pain  Triage Note Pt arrived from home with complaints of back and bilateral hip pain, states that he take oral chemo for bone cancer and ran out of his pain medication 3 days ago. Also reports nausea, vomiting, and weakness. Pt given 500 cc of NS, fentanyl and zofran en route.   Allergies No Known Allergies  Level of Care/Admitting Diagnosis ED Disposition    ED Disposition Condition Comment   Admit  Hospital Area: [facility] [[id number]]  Level of Care: Med-Surg [16]  Covid Evaluation: Asymptomatic Screening Protocol (No Symptoms)  Diagnosis: Intractable nausea and vomiting [id number]  Admitting Physician: Ivor Costa [[id number]]  Attending Physician: Ivor Costa [[id number]]  PT Class (Do Not Modify): Observation [104]  PT Acc Code (Do Not Modify): Observation [10022]       B Medical/Surgery History Past Medical History:  Diagnosis Date  . COPD (chronic obstructive pulmonary disease) ([place])   . Hypertension   . Polysubstance abuse ([place])   . Prostate cancer [facility])    Past Surgical History:  Procedure Laterality Date  . IR RADIOLOGIST EVAL & MGMT  01/15/2019  . NO PAST SURGERIES    . PROSTATE BIOPSY       A IV Location/Drains/Wounds Patient Lines/Drains/Airways Status   Active Line/Drains/Airways    Name:   Placement date:   Placement time:   Site:   Days:   Peripheral IV 08/03/19 Left Antecubital   08/03/19    2046    Antecubital   1          Intake/Output Last 24 hours  Intake/Output Summary (Last 24 hours) at 08/04/2019 0454 Last data filed at 08/04/2019 0427 Gross per 24 hour  Intake 4412.25 ml   Output -  Net 4412.25 ml    Labs/Imaging Results for orders placed or performed during the hospital encounter of 08/03/19 (from the past 48 hour(s))  CBC with Differential     Status: Abnormal   Collection Time: 08/03/19  9:31 PM  Result Value Ref Range   WBC 8.4 4.0 - 10.5 K/uL   RBC 3.75 (L) 4.22 - 5.81 MIL/uL   Hemoglobin 9.2 (L) 13.0 - 17.0 g/dL   HCT 30.3 (L) 39.0 - 52.0 %   MCV 80.8 80.0 - 100.0 fL   MCH 24.5 (L) 26.0 - 34.0 pg   MCHC 30.4 30.0 - 36.0 g/dL   RDW 22.5 (H) 11.5 - 15.5 %   Platelets 435 (H) 150 - 400 K/uL   nRBC 0.0 0.0 - 0.2 %   Neutrophils Relative % 79 %   Neutro Abs 6.7 1.7 - 7.7 K/uL   Lymphocytes Relative 11 %   Lymphs Abs 0.9 0.7 - 4.0 K/uL   Monocytes Relative 9 %   Monocytes Absolute 0.7 0.1 - 1.0 K/uL   Eosinophils Relative 0 %   Eosinophils Absolute 0.0 0.0 - 0.5 K/uL   Basophils Relative 0 %   Basophils Absolute 0.0 0.0 - 0.1 K/uL   Immature Granulocytes 1 %   Abs Immature Granulocytes 0.04 0.00 - 0.07 K/uL   Polychromasia PRESENT    Target Cells PRESENT     Comment:  Performed at [facility], [place] [address], [place], [place] [address]  Comprehensive metabolic panel     Status: Abnormal   Collection Time: 08/03/19  9:31 PM  Result Value Ref Range   Sodium 133 (L) 135 - 145 mmol/L   Potassium 2.9 (L) 3.5 - 5.1 mmol/L   Chloride 101 98 - 111 mmol/L   CO2 21 (L) 22 - 32 mmol/L   Glucose, Bld 98 70 - 99 mg/dL   BUN 8 6 - 20 mg/dL   Creatinine, Ser 0.37 (L) 0.61 - 1.24 mg/dL   Calcium 8.8 (L) 8.9 - 10.3 mg/dL   Total Protein 6.9 6.5 - 8.1 g/dL   Albumin 3.1 (L) 3.5 - 5.0 g/dL   AST 39 15 - 41 U/L   ALT 10 0 - 44 U/L   Alkaline Phosphatase 2,544 (H) 38 - 126 U/L    Comment: RESULTS CONFIRMED BY MANUAL DILUTION   Total Bilirubin 0.7 0.3 - 1.2 mg/dL   GFR calc non Af Amer >60 >60 mL/min   GFR calc Af Amer >60 >60 mL/min   Anion gap 11 5 - 15    Comment: Performed at [facility], [place] [address] [place], [place] [address]  Lipase, blood     Status: None   Collection Time: 08/03/19  9:31 PM  Result Value Ref Range   Lipase 22 11 - 51 U/L    Comment: Performed at [facility], [place] [address], [place], [place] [address]  Urinalysis, Routine w reflex microscopic     Status: Abnormal   Collection Time: 08/04/19  1:52 AM  Result Value Ref Range   Color, Urine YELLOW YELLOW   APPearance CLEAR CLEAR   Specific Gravity, Urine 1.024 1.005 - 1.030   pH 6.0 5.0 - 8.0   Glucose, UA NEGATIVE NEGATIVE mg/dL   Hgb urine dipstick NEGATIVE NEGATIVE   Bilirubin Urine NEGATIVE NEGATIVE   Ketones, ur 5 (A) NEGATIVE mg/dL   Protein, ur NEGATIVE NEGATIVE mg/dL   Nitrite NEGATIVE NEGATIVE   Leukocytes,Ua NEGATIVE NEGATIVE    Comment: Performed at [place] [address] [place],  [address]   Dg Lumbar Spine Complete  Result Date: 08/04/2019 CLINICAL DATA:  Back and bilateral hip pain EXAM: LUMBAR SPINE - COMPLETE 4+ VIEW COMPARISON:  None. FINDINGS: There is diffuse mottled sclerotic appearance throughout the osseous structures. No definite fracture is seen. No malalignment. Scattered vascular calcifications are noted. IMPRESSION: Diffuse osseous metastatic disease throughout. No definite fracture. Electronically Signed   By: Prudencio Pair M.D.   On: 08/04/2019 02:32   Dg Hips Bilat W Or Wo Pelvis 3-4 Views  Result Date: 08/04/2019 CLINICAL DATA:  Back and bilateral hip pain EXAM: DG HIP (WITH OR WITHOUT PELVIS) 3-4V BILAT COMPARISON:  May 26, 2019 FINDINGS: Again noted is diffuse mottled sclerotic appearance of the osseous structures. No fracture is identified. The SI joints appear to be grossly intact. Overlying bowel gas is seen in the deep pelvis. IMPRESSION: Diffuse osseous metastatic disease.  No definite acute fracture. Electronically Signed   By: Prudencio Pair M.D.   On: 08/04/2019 02:27    Pending Labs Unresulted Labs (From admission,  onward)    Start     Ordered   08/04/19 0101  SARS CORONAVIRUS 2 (TAT 6-24 HRS) Nasopharyngeal Nasopharyngeal Swab  (Asymptomatic/Tier 2 Patients Labs)  Once,   STAT    Question Answer Comment  Is this test for diagnosis or  screening Screening   Symptomatic for COVID-19 as defined by CDC No   Hospitalized for COVID-19 No   Admitted to ICU for COVID-19 No   Previously tested for COVID-19 No   Resident in a congregate (group) care setting No   Employed in healthcare setting No      08/04/19 0101   Signed and Held  Magnesium  Tomorrow morning,   R     Signed and Held   Signed and Held  Comprehensive metabolic panel  Once,   R     Signed and Held   Signed and Held  Urine rapid drug screen (hosp performed)  Once,   R     Signed and Held          Vitals/Pain Today's Vitals   08/04/19 0030 08/04/19 0035 08/04/19 0304 08/04/19 0400  BP: 129/79  127/74 139/67  Pulse: 80  80 85  Resp: 15  16 16   Temp:      TempSrc:      SpO2: 100%  100% 100%  Weight:      Height:      PainSc:  Asleep      Isolation Precautions No active isolations  Medications Medications  potassium chloride SA (KLOR-CON) CR tablet 40 mEq (40 mEq Oral Refused 08/04/19 0327)  morphine (MS CONTIN) 12 hr tablet 60 mg (60 mg Oral Given 08/04/19 0256)  oxyCODONE-acetaminophen (PERCOCET/ROXICET) 5-325 MG per tablet 1 tablet (has no administration in time range)    And  oxyCODONE (Oxy IR/ROXICODONE) immediate release tablet 5 mg (has no administration in time range)  magnesium sulfate IVPB 1 g 100 mL (1 g Intravenous New Bag/Given 08/04/19 0426)  levETIRAcetam (KEPPRA) IVPB 500 mg/100 mL premix (0 mg Intravenous Stopped 08/04/19 0401)  LORazepam (ATIVAN) injection 1 mg (has no administration in time range)  0.9 %  sodium chloride infusion ( Intravenous New Bag/Given 08/04/19 0426)  sodium chloride 0.9 % bolus 1,000 mL (0 mLs Intravenous Stopped 08/04/19 0001)  morphine 4 MG/ML injection 4 mg (4 mg Intravenous Given  08/03/19 2152)  ondansetron (ZOFRAN) injection 4 mg (4 mg Intravenous Given 08/03/19 2150)  HYDROmorphone (DILAUDID) injection 1 mg (1 mg Intravenous Given 08/03/19 2324)  potassium chloride 10 mEq in 100 mL IVPB (0 mEq Intravenous Stopped 08/04/19 0206)  potassium chloride SA (KLOR-CON) CR tablet 40 mEq (40 mEq Oral Given 08/04/19 0004)  ondansetron (ZOFRAN) injection 4 mg (4 mg Intravenous Given 08/04/19 0003)  sodium chloride 0.9 % bolus 1,000 mL (0 mLs Intravenous Stopped 08/04/19 0427)  ketorolac (TORADOL) 30 MG/ML injection 15 mg (15 mg Intravenous Given 08/04/19 0106)    Mobility walks Moderate fall risk     R Recommendations: See Admitting Provider Note  Report given to:   Additional Notes:

## 2019-08-05 MED ORDER — POTASSIUM CHLORIDE CRYS ER 20 MEQ PO TBCR: 40.0000 meq | EXTENDED_RELEASE_TABLET | Freq: Every day | ORAL | 0 refills | Status: AC

## 2019-08-05 MED ORDER — ALBUTEROL SULFATE HFA 108 (90 BASE) MCG/ACT IN AERS: 2.0000 | INHALATION_SPRAY | Freq: Four times a day (QID) | RESPIRATORY_TRACT | 0 refills | Status: DC | PRN

## 2019-08-05 MED ORDER — DM-GUAIFENESIN ER 30-600 MG PO TB12: 1.0000 | ORAL_TABLET | Freq: Two times a day (BID) | ORAL | 0 refills | Status: AC | PRN

## 2019-08-05 MED ORDER — HYDROMORPHONE HCL 1 MG/ML IJ SOLN
1.0000 mg | Freq: Once | INTRAMUSCULAR | Status: AC
  Administered 2019-08-05: 07:00:00 1 mg via INTRAVENOUS
  Filled 2019-08-05: qty 1

## 2019-08-05 MED ORDER — FOLIC ACID 1 MG PO TABS: 1.0000 mg | ORAL_TABLET | Freq: Every day | ORAL | 0 refills | Status: AC

## 2019-08-05 MED ORDER — POTASSIUM CHLORIDE CRYS ER 20 MEQ PO TBCR
40.0000 meq | EXTENDED_RELEASE_TABLET | Freq: Two times a day (BID) | ORAL | Status: AC
  Administered 2019-08-05: 10:00:00 40 meq via ORAL
  Filled 2019-08-05: qty 2

## 2019-08-05 MED FILL — MORPHINE SULF 60 MG TAB SA: 60 | 30 days supply | Qty: 60 | Fill #0

## 2019-08-05 MED FILL — POTASSIUM CHLORIDE CRYS ER: 20 | 30 days supply | Qty: 60 | Fill #0

## 2019-08-05 MED FILL — OXYCODONE-APAP 10-325: 10-325 | 20 days supply | Qty: 120 | Fill #0

## 2019-08-05 MED FILL — ALBUTEROL SULFATE HFA 108 (: 108 (90 BAS | 25 days supply | Qty: 18 | Fill #0

## 2019-08-05 MED FILL — FOLIC ACID 1 MG TABS: 1 | 30 days supply | Qty: 30 | Fill #0

## 2019-08-05 NOTE — Discharge Summary (Signed)
Physician Discharge Summary  DEMETRIO LEIGHTY [id number] DOB: Oct 20, 1966 DOA: 08/03/2019  PCP: System, Pcp Not In  Admit date: 08/03/2019 Discharge date: 08/05/2019  Time spent: 25 minutes  Recommendations for Outpatient Follow-up:  1. Needs Chem-12 and magnesium in about 1 week 2. Recommend outpatient follow-up with oncologist to be CCed on this discharge note with regards to pain management and further therapy with Xtandi as he came in with predominant nausea which is a side effect of this 3. Patient has prescription for pain meds at home he was given albuterol refill his blood pressure should be monitored in the outpatient setting as he was discontinued off his Maxide this admission  Discharge Diagnoses:  Principal Problem:   Intractable nausea and vomiting Active Problems:   Alcoholism   Essential hypertension   Seizure disorder (HCC)   Polysubstance abuse ([place])   COPD with chronic bronchitis (HCC)   Tobacco use   Prostate cancer metastatic to bone ([place])   Cancer associated pain   Hypokalemia   Normocytic anemia   Discharge Condition: Improved  Diet recommendation: Heart healthy  Filed Weights   08/03/19 2054 08/04/19 0535  Weight: 90.7 kg 76.9 kg    History of present illness:  10 black male metastatic cancer to the bone on Xtandi which was recently started, HTN, HLD, COPD, tobacco, EtOH admitted with bilateral leg pain nausea vomiting--- states ran out of his pain meds recently has had vomiting most times that he eats food with mild diarrhea.  Labs were reassuring although temperature was 99 2 on admission alk phos was 2500 which trended downwards by time of discharge  Hospital Course:  Patient was admitted overnight he had his potassium replaced he had a slight low-grade temp of 99 and continued to have low back pain--it was felt that his nausea was related to his Gillermina Phy which can cause this and his pain was moderately controlled with his MS Contin and Percocet  (he had run out at home and his scripts were not ready for pickup) His potassium was replaced and dosages were adjusted for discharge to 40 twice daily and he was given a supply for month He had a metabolic acidosis on admission that was resolved with IV fluid 100 cc/h and it was felt that he had stabilized after therapy had seen him to discharge home with home health PT as he had had a couple of falls recently and he has a walker already at home He tolerated a full meal and ordered breakfast was able to tolerate [place] toast prior to leaving and have stabilized for discharge Recommendations as above Consultations:  None at this time  Discharge Exam: Vitals:   08/04/19 2018 08/05/19 0611  BP: 113/74 134/78  Pulse: 83 87  Resp: 17 18  Temp: 98.1 F (36.7 C) 99.3 F (37.4 C)  SpO2: 98% 99%    General: Awake alert coherent no distress EOMI NCAT frail slightly cachectic no focal deficit Cardiovascular: S1-S2 no murmur rub or gallop Respiratory: Clinically clear no added sound no rales no rhonchi chest clear Abdomen soft nontender no rebound no guarding Neurologically intact  Discharge Instructions   Discharge Instructions    Diet - low sodium heart healthy   Complete by: As directed    Discharge instructions   Complete by: As directed    We have called in Mucinex folic acid and Zofran for you and I have prescribed you a new inhaler to take every 4-6 hours-I would recommend that you stop taking the  CelebrexCompazine, Maxide Please get your blood pressure checked within 1 week in addition to potassium I have also called in some potassium pills for you We will ask that Dr. Alen Blew see you in consult in the near future to discuss further chemotherapy and I will CC him on this note   Increase activity slowly   Complete by: As directed      Allergies as of 08/05/2019   No Known Allergies     Medication List    STOP taking these medications   celecoxib 100 MG capsule Commonly  known as: CeleBREX   diazepam 10 MG Gel Commonly known as: DIASTAT ACUDIAL   doxycycline 100 MG tablet Commonly known as: VIBRA-TABS   ondansetron 8 MG tablet Commonly known as: ZOFRAN   prochlorperazine 10 MG tablet Commonly known as: COMPAZINE   triamterene-hydrochlorothiazide 75-50 MG tablet Commonly known as: MAXZIDE     TAKE these medications   abiraterone acetate 250 MG tablet Commonly known as: ZYTIGA TAKE 4 TABLETS (1,000 MG TOTAL) BY MOUTH DAILY. TAKE ON AN EMPTY STOMACH 1 HOUR BEFORE OR 2 HOURS AFTER A MEAL   albuterol 108 (90 Base) MCG/ACT inhaler Commonly known as: VENTOLIN HFA Inhale 2 puffs into the lungs every 4 (four) hours as needed. What changed: reasons to take this   atenolol 100 MG tablet Commonly known as: TENORMIN Take 1 tablet (100 mg total) by mouth daily.   dextromethorphan-guaiFENesin 30-600 MG 12hr tablet Commonly known as: MUCINEX DM Take 1 tablet by mouth 2 (two) times daily as needed for cough.   folic acid 1 MG tablet Commonly known as: FOLVITE Take 1 tablet (1 mg total) by mouth daily.   gabapentin 300 MG capsule Commonly known as: NEURONTIN Take 1 capsule (300 mg total) by mouth 3 (three) times daily. Start with 1 pill at bedtime for the first week then increase if tolerated   levETIRAcetam 500 MG tablet Commonly known as: Keppra Take 1 tablet (500 mg total) by mouth 2 (two) times daily.   morphine 60 MG 12 hr tablet Commonly known as: MS CONTIN Take 1 tablet (60 mg total) by mouth every 12 (twelve) hours.   ondansetron 8 MG disintegrating tablet Commonly known as: Zofran ODT 50m ODT q8 hours prn nausea   oxyCODONE-acetaminophen 10-325 MG tablet Commonly known as: Percocet Take 1 tablet by mouth every 4 (four) hours as needed for pain.   polyethylene glycol powder 17 GM/SCOOP powder Commonly known as: GLYCOLAX/MIRALAX Take 17 g by mouth daily. Mixed with at least 8 ounces of water to treat constipation What changed:    when to take this  reasons to take this   potassium chloride SA 20 MEQ tablet Commonly known as: KLOR-CON Take 2 tablets (40 mEq total) by mouth daily.   predniSONE 5 MG tablet Commonly known as: DELTASONE Take 1 tablet (5 mg total) by mouth daily with breakfast.   valACYclovir 500 MG tablet Commonly known as: VALTREX Take 500 mg by mouth daily.      No Known Allergies    The results of significant diagnostics from this hospitalization (including imaging, microbiology, ancillary and laboratory) are listed below for reference.    Significant Diagnostic Studies: Dg Lumbar Spine Complete  Result Date: 08/04/2019 CLINICAL DATA:  Back and bilateral hip pain EXAM: LUMBAR SPINE - COMPLETE 4+ VIEW COMPARISON:  None. FINDINGS: There is diffuse mottled sclerotic appearance throughout the osseous structures. No definite fracture is seen. No malalignment. Scattered vascular calcifications are noted. IMPRESSION: Diffuse osseous  metastatic disease throughout. No definite fracture. Electronically Signed   By: Prudencio Pair M.D.   On: 08/04/2019 02:32   Dg Hips Bilat W Or Wo Pelvis 3-4 Views  Result Date: 08/04/2019 CLINICAL DATA:  Back and bilateral hip pain EXAM: DG HIP (WITH OR WITHOUT PELVIS) 3-4V BILAT COMPARISON:  May 26, 2019 FINDINGS: Again noted is diffuse mottled sclerotic appearance of the osseous structures. No fracture is identified. The SI joints appear to be grossly intact. Overlying bowel gas is seen in the deep pelvis. IMPRESSION: Diffuse osseous metastatic disease.  No definite acute fracture. Electronically Signed   By: Prudencio Pair M.D.   On: 08/04/2019 02:27    Microbiology: Recent Results (from the past 240 hour(s))  SARS CORONAVIRUS 2 (TAT 6-24 HRS) Nasopharyngeal Nasopharyngeal Swab     Status: None   Collection Time: 08/04/19  1:01 AM   Specimen: Nasopharyngeal Swab  Result Value Ref Range Status   SARS Coronavirus 2 NEGATIVE NEGATIVE Final    Comment:  (NOTE) SARS-CoV-2 target nucleic acids are NOT DETECTED. The SARS-CoV-2 RNA is generally detectable in upper and lower respiratory specimens during the acute phase of infection. Negative results do not preclude SARS-CoV-2 infection, do not rule out co-infections with other pathogens, and should not be used as the sole basis for treatment or other patient management decisions. Negative results must be combined with clinical observations, patient history, and epidemiological information. The expected result is Negative. Fact Sheet for Patients: SugarRoll.be Fact Sheet for Healthcare Providers: https://www.woods-mathews.com/ This test is not yet approved or cleared by the [place] FDA and  has been authorized for detection and/or diagnosis of SARS-CoV-2 by FDA under an Emergency Use Authorization (EUA). This EUA will remain  in effect (meaning this test can be used) for the duration of the COVID-19 declaration under Section 56 4(b)(1) of the Act, 21 U.S.C. section 360bbb-3(b)(1), unless the authorization is terminated or revoked sooner. Performed at [facility], [place] [address], [place], [place] [address]      Labs: Basic Metabolic Panel: Recent Labs  Lab 08/03/19 2131 08/04/19 0530  NA 133* 136  K 2.9* 3.1*  CL 101 107  CO2 21* 21*  GLUCOSE 98 97  BUN 8 9  CREATININE 0.37* 0.44*  CALCIUM 8.8* 8.6*  MG  --  2.2   Liver Function Tests: Recent Labs  Lab 08/03/19 2131 08/04/19 0530  AST 39 33  ALT 10 9  ALKPHOS 2,544* 2,090*  BILITOT 0.7 0.7  PROT 6.9 6.1*  ALBUMIN 3.1* 2.7*   Recent Labs  Lab 08/03/19 2131  LIPASE 22   No results for input(s): AMMONIA in the last 168 hours. CBC: Recent Labs  Lab 08/03/19 2131  WBC 8.4  NEUTROABS 6.7  HGB 9.2*  HCT 30.3*  MCV 80.8  PLT 435*   Cardiac Enzymes: No results for input(s): CKTOTAL, CKMB, CKMBINDEX, TROPONINI in the last 168 hours. BNP: BNP (last 3  results) No results for input(s): BNP in the last 8760 hours.  ProBNP (last 3 results) No results for input(s): PROBNP in the last 8760 hours.  CBG: No results for input(s): GLUCAP in the last 168 hours.     Signed:  Nita Sells MD   [facility] 08/05/2019, 8:55 AM

## 2019-08-13 ENCOUNTER — Other Ambulatory Visit: Payer: Self-pay | Admitting: Oncology

## 2019-08-15 ENCOUNTER — Inpatient Hospital Stay: Payer: Medicaid Other | Attending: Oncology | Admitting: Oncology

## 2019-08-15 ENCOUNTER — Inpatient Hospital Stay: Payer: Medicaid Other

## 2019-08-15 ENCOUNTER — Other Ambulatory Visit: Payer: Self-pay

## 2019-08-15 VITALS — BP 113/72 | HR 89 | Temp 98.9°F | Resp 18 | Ht 73.0 in | Wt 163.7 lb

## 2019-08-15 DIAGNOSIS — Z923 Personal history of irradiation: Secondary | ICD-10-CM | POA: Insufficient documentation

## 2019-08-15 DIAGNOSIS — I10 Essential (primary) hypertension: Secondary | ICD-10-CM | POA: Insufficient documentation

## 2019-08-15 DIAGNOSIS — C7951 Secondary malignant neoplasm of bone: Secondary | ICD-10-CM | POA: Diagnosis present

## 2019-08-15 DIAGNOSIS — C61 Malignant neoplasm of prostate: Secondary | ICD-10-CM | POA: Insufficient documentation

## 2019-08-15 DIAGNOSIS — Z79899 Other long term (current) drug therapy: Secondary | ICD-10-CM | POA: Insufficient documentation

## 2019-08-15 DIAGNOSIS — R531 Weakness: Secondary | ICD-10-CM | POA: Insufficient documentation

## 2019-08-15 DIAGNOSIS — Z9221 Personal history of antineoplastic chemotherapy: Secondary | ICD-10-CM | POA: Diagnosis not present

## 2019-08-15 LAB — CMP (CANCER CENTER ONLY)
ALT: 8 U/L (ref 0–44)
AST: 48 U/L — ABNORMAL HIGH (ref 15–41)
Albumin: 2.8 g/dL — ABNORMAL LOW (ref 3.5–5.0)
Alkaline Phosphatase: 3414 U/L — ABNORMAL HIGH (ref 38–126)
Anion gap: 11 (ref 5–15)
BUN: 10 mg/dL (ref 6–20)
CO2: 27 mmol/L (ref 22–32)
Calcium: 9 mg/dL (ref 8.9–10.3)
Chloride: 97 mmol/L — ABNORMAL LOW (ref 98–111)
Creatinine: 0.57 mg/dL — ABNORMAL LOW (ref 0.61–1.24)
GFR, Est AFR Am: >60 mL/min (ref >60)
GFR, Estimated: >60 mL/min (ref >60)
Glucose, Bld: 90 mg/dL (ref 70–99)
Potassium: 3.1 mmol/L — ABNORMAL LOW (ref 3.5–5.1)
Sodium: 135 mmol/L (ref 135–145)
Total Bilirubin: 0.9 mg/dL (ref 0.3–1.2)
Total Protein: 7.3 g/dL (ref 6.5–8.1)

## 2019-08-15 LAB — CBC WITH DIFFERENTIAL (CANCER CENTER ONLY)
Abs Immature Granulocytes: 0.05 10*3/uL (ref 0.00–0.07)
Basophils Absolute: 0 10*3/uL (ref 0.0–0.1)
Basophils Relative: 0 %
Eosinophils Absolute: 0 10*3/uL (ref 0.0–0.5)
Eosinophils Relative: 0 %
HCT: 28.7 % — ABNORMAL LOW (ref 39.0–52.0)
Hemoglobin: 8.7 g/dL — ABNORMAL LOW (ref 13.0–17.0)
Immature Granulocytes: 1 %
Lymphocytes Relative: 12 %
Lymphs Abs: 0.9 10*3/uL (ref 0.7–4.0)
MCH: 24 pg — ABNORMAL LOW (ref 26.0–34.0)
MCHC: 30.3 g/dL (ref 30.0–36.0)
MCV: 79.1 fL — ABNORMAL LOW (ref 80.0–100.0)
Monocytes Absolute: 0.8 10*3/uL (ref 0.1–1.0)
Monocytes Relative: 11 %
Neutro Abs: 5.3 10*3/uL (ref 1.7–7.7)
Neutrophils Relative %: 76 %
Platelet Count: 454 10*3/uL — ABNORMAL HIGH (ref 150–400)
RBC: 3.63 MIL/uL — ABNORMAL LOW (ref 4.22–5.81)
RDW: 22.7 % — ABNORMAL HIGH (ref 11.5–15.5)
WBC Count: 7.1 10*3/uL (ref 4.0–10.5)
nRBC: 0.6 % — ABNORMAL HIGH (ref 0.0–0.2)

## 2019-08-15 MED ORDER — MORPHINE SULFATE ER 60 MG PO TBCR: 60.0000 mg | EXTENDED_RELEASE_TABLET | Freq: Three times a day (TID) | ORAL | 0 refills | Status: DC

## 2019-08-15 MED ORDER — OXYCODONE-ACETAMINOPHEN 10-325 MG PO TABS: 1.0000 | ORAL_TABLET | ORAL | 0 refills | Status: DC | PRN

## 2019-08-15 NOTE — Progress Notes (Signed)
Hematology and Oncology Follow Up Visit  Roy Rosales [id number] 12/07/1965 53 y.o. 08/15/2019 9:53 AM System, Pcp Not InFulp, Cammie, MD   Principle Diagnosis: 53year-old man with advanced prostate cancer with disease to the bone and lymphadenopathy diagnosed in February 2020.  He was found to have Gleason score 5+4 = 9 and a PSA of 335 with castration-resistant currently.  Prior Therapy:  He is status post prostate biopsy completed in February 2020.  Confirmed the presence of prostate adenocarcinoma Gleason score 5+4 = 9.  He is status post radiation therapy to the thoracic spine.  He completed 30 Gy in 10 fractions in March 2020.  Firmagon 240 mg given in February 2020.  Taxotere chemotherapy 75 mg per metered square started on 01/02/2019.  He completed 6 cycles of therapy.   Current therapy: Zytiga 1000 mg daily with prednisone started in August 2020.   Lupron 30 mg every 4 months started on 01/23/2019.  His next Eligard will be in December 2020.  Interim History: Roy Rosales is here for a repeat evaluation.  Since the last visit, he is continuing to do poorly with generalized bone pain.  It is unclear specific site of pain predominantly diffuse but does report lower extremity and lower back discomfort.  He is also noting overall generalized weakness.  He denies any recent hospitalizations or illnesses.  He is currently on morphine with oxycodone for breakthrough which has not been holding his pain at this time.  His performance status is declining overall.  He denied any alteration mental status, neuropathy, confusion or dizziness.  Denies any headaches or lethargy.  Denies any night sweats, but has lost weight.  Denied orthopnea, dyspnea on exertion or chest discomfort.  Denies shortness of breath, difficulty breathing hemoptysis or cough.  Denies any abdominal distention, nausea, early satiety or dyspepsia.  Denies any hematuria, frequency, dysuria or nocturia.  Denies any skin  irritation, dryness or rash.  Denies any ecchymosis or petechiae.  Denies any lymphadenopathy or clotting.  Denies any heat or cold intolerance.  Denies any anxiety or depression.  Remaining review of system is negative.            Medications: Reviewed without any changes. Current Outpatient Medications  Medication Sig Dispense Refill  . abiraterone acetate (ZYTIGA) 250 MG tablet TAKE 4 TABLETS (1,000 MG TOTAL) BY MOUTH DAILY. TAKE ON AN EMPTY STOMACH 1 HOUR BEFORE OR 2 HOURS AFTER A MEAL 120 tablet 0  . albuterol (PROVENTIL HFA;VENTOLIN HFA) 108 (90 Base) MCG/ACT inhaler Inhale 2 puffs into the lungs every 4 (four) hours as needed. (Patient taking differently: Inhale 2 puffs into the lungs every 4 (four) hours as needed for wheezing or shortness of breath. ) 1 Inhaler 2  . atenolol (TENORMIN) 100 MG tablet Take 1 tablet (100 mg total) by mouth daily. 30 tablet 6  . dextromethorphan-guaiFENesin (MUCINEX DM) 30-600 MG 12hr tablet Take 1 tablet by mouth 2 (two) times daily as needed for cough. 60 tablet 0  . folic acid (FOLVITE) 1 MG tablet Take 1 tablet (1 mg total) by mouth daily. 30 tablet 0  . gabapentin (NEURONTIN) 300 MG capsule Take 1 capsule (300 mg total) by mouth 3 (three) times daily. Start with 1 pill at bedtime for the first week then increase if tolerated 90 capsule 6  . levETIRAcetam (KEPPRA) 500 MG tablet Take 1 tablet (500 mg total) by mouth 2 (two) times daily. 60 tablet 11  . morphine (MS CONTIN) 60 MG 12 hr  tablet Take 1 tablet (60 mg total) by mouth every 12 (twelve) hours. 60 tablet 0  . ondansetron (ZOFRAN ODT) 8 MG disintegrating tablet 8mg  ODT q8 hours prn nausea 8 tablet 0  . oxyCODONE-acetaminophen (PERCOCET) 10-325 MG tablet Take 1 tablet by mouth every 4 (four) hours as needed for pain. 120 tablet 0  . polyethylene glycol powder (GLYCOLAX/MIRALAX) powder Take 17 g by mouth daily. Mixed with at least 8 ounces of water to treat constipation (Patient taking  differently: Take 17 g by mouth daily as needed for mild constipation. Mixed with at least 8 ounces of water to treat constipation) 3350 g 11  . potassium chloride SA (KLOR-CON) 20 MEQ tablet Take 2 tablets (40 mEq total) by mouth daily. 60 tablet 0  . predniSONE (DELTASONE) 5 MG tablet Take 1 tablet (5 mg total) by mouth daily with breakfast. 90 tablet 3  . valACYclovir (VALTREX) 500 MG tablet Take 500 mg by mouth daily.     No current facility-administered medications for this visit.      Allergies: No Known Allergies  Past Medical History, Surgical history, Social history, and Family History updated on review.   Physical Exam:    Blood pressure 113/72, pulse 89, temperature 98.9 F (37.2 C), temperature source Temporal, resp. rate 18, height 6\' 1"  (1.854 m), weight 163 lb 11.2 oz (74.3 kg), SpO2 100 %.    ECOG: 1     General appearance: Alert, awake without any distress. Head: Atraumatic without abnormalities Oropharynx: Without any thrush or ulcers. Eyes: No scleral icterus. Lymph nodes: No lymphadenopathy noted in the cervical, supraclavicular, or axillary nodes Heart:regular rate and rhythm, without any murmurs or gallops.   Lung: Clear to auscultation without any rhonchi, wheezes or dullness to percussion. Abdomin: Soft, nontender without any shifting dullness or ascites. Musculoskeletal: No clubbing or cyanosis. Neurological: No motor or sensory deficits. Skin: No rashes or lesions. Psychiatric: Mood and affect appeared normal.               Lab Results: Lab Results  Component Value Date   WBC 7.1 08/15/2019   HGB 8.7 (L) 08/15/2019   HCT 28.7 (L) 08/15/2019   MCV 79.1 (L) 08/15/2019   PLT 454 (H) 08/15/2019     Chemistry      Component Value Date/Time   NA 136 08/04/2019 0530   NA 134 11/20/2018 1138   K 3.1 (L) 08/04/2019 0530   CL 107 08/04/2019 0530   CO2 21 (L) 08/04/2019 0530   BUN 9 08/04/2019 0530   BUN 11 11/20/2018 1138    CREATININE 0.44 (L) 08/04/2019 0530   CREATININE 0.58 (L) 07/03/2019 1502      Component Value Date/Time   CALCIUM 8.6 (L) 08/04/2019 0530   ALKPHOS 2,090 (H) 08/04/2019 0530   AST 33 08/04/2019 0530   AST 25 07/03/2019 1502   ALT 9 08/04/2019 0530   ALT 6 07/03/2019 1502   BILITOT 0.7 08/04/2019 0530   BILITOT 0.3 07/03/2019 1502       Results for Roy Rosales, Roy Rosales (MRN [id number]) as of 08/15/2019 09:53  Ref. Range 05/28/2019 12:41 07/03/2019 15:02  Prostate Specific Ag, Serum Latest Ref Range: 0.0 - 4.0 ng/mL 22.5 (H) 15.0 (H)        Impression and Plan:  53 year old man with:  1.    Advanced prostate cancer with disease to the bone and lymphadenopathy that is currently castration-resistant.    He has tolerated Zytiga reasonably well with reasonable PSA decline  at this time.  Risks and benefits of continuing this therapy was discussed today and he is agreeable to continue.  Alternative option would be salvage chemotherapy utilizing Jevtana.  2.    Weight loss: Predominantly related to increased pain and loss of appetite.  We have discussed strategies to improve that including increasing nutritional supplements.  3.    Hypertension: No issues with his blood pressure at this time we will continue to monitor on Zytiga.  4.  Androgen deprivation: He is up-to-date on androgen deprivation.  Long-term complications related to his therapy including weight gain, hot flashes as well as metabolic syndrome.  He is agreeable to continue with the next injection in December 2020.   5.    Thoracic spine metastasis: No recent relapse noted with his thoracic MRI obtained in June 2020.  6.  Pain: His pain is more diffuse although he is experiencing more lower back pain as well as weakness that is not acute.  Will obtain imaging studies of his lumbar spine and consider radiation therapy if needed.  In the meantime I will increase his morphine to 60 mg 3 times a day with breakthrough  oxycodone.  7.  Bone directed therapy: This option is deferred because of poor dental hygiene.  I asked him to continue with calcium and vitamin D supplements.  8.  Prognosis and goals of care: His prognosis is overall poor given the aggressive nature of his cancer and debilitation he is experiencing.  Aggressive measures are currently warranted given his reasonable performance status.  9.  Follow-up: He will return in 4 weeks for repeat evaluation.  25  minutes was spent with the patient face-to-face today.  More than 50% of time was dedicated to reviewing his disease status, treatment options and addressing complications related to therapy.    Zola Button, MD 10/16/20209:53 AM

## 2019-08-16 LAB — PROSTATE-SPECIFIC AG, SERUM (LABCORP): Prostate Specific Ag, Serum: 50.6 ng/mL — ABNORMAL HIGH (ref 0.0–4.0)

## 2019-08-18 MED FILL — ABIRATERONE ACETATE 250 MG: 250 | 30 days supply | Qty: 120 | Fill #0

## 2019-08-19 ENCOUNTER — Telehealth: Payer: Self-pay | Admitting: Oncology

## 2019-08-19 ENCOUNTER — Telehealth: Payer: Self-pay

## 2019-08-19 MED FILL — MORPHINE SULF 60 MG TAB SA: 60 | 30 days supply | Qty: 90 | Fill #0

## 2019-08-19 NOTE — Telephone Encounter (Signed)
Please see message below. Spoke to [employer] at [facility] and refill will be processed today. Patient called and notified that his morphine prescription will be refilled and he can pick it up later today. Patient verbalized understanding.

## 2019-08-19 NOTE — Telephone Encounter (Signed)
-----   Message from Wyatt Portela, MD sent at 08/19/2019 11:29 AM EDT ----- Regarding: RE: Patient refill request Ok to refill early. Thanks ----- Message ----- From: Teodoro Spray, RN Sent: 08/19/2019  11:15 AM EDT To: Wyatt Portela, MD Subject: Patient refill request                         Patient called office stating pharmacy will not fill new morphine prescription until 08/22/19 (script sent in 08/15/19 at patient last office visit). Called pharmacy to inquire about refill and pharmacist states even with the increased morphine dose, patient would not be due for refill until 08/22/19.  Patient states pain has not improved and wants to know if he can have his refill earlier than 08/22/19. Pharmacy states they would be able to refill early if it is approved by the physician.   Please advise.

## 2019-08-19 NOTE — Telephone Encounter (Signed)
Scheduled appt per 10/16 sch message - pt is aware of appt date and time

## 2019-08-20 ENCOUNTER — Telehealth: Payer: Self-pay

## 2019-08-20 DIAGNOSIS — C61 Malignant neoplasm of prostate: Secondary | ICD-10-CM

## 2019-08-20 DIAGNOSIS — C7951 Secondary malignant neoplasm of bone: Secondary | ICD-10-CM

## 2019-08-20 NOTE — Telephone Encounter (Signed)
[place] notified this nurse stating that home health services would not be approved by Medicaid due to the lack of need for skilled nursing at this time. Patient notified and verbalized understanding. Patient instructed to call office if he has any further questions or has a change in status.

## 2019-08-20 NOTE — Telephone Encounter (Signed)
Patient's mother called office with information stated below.  Patient's mother requested home health to assist patient with ADLs. Mother states patient is unable to dress or bath self due to pain. Per Dr. Alen Blew below, ok to have home health evaluate patient. Patient and mother notified of information below. Patient verbalized understanding. Patient will call office if he has any additional questions.

## 2019-08-20 NOTE — Telephone Encounter (Signed)
-----   Message from Wyatt Portela, MD sent at 08/20/2019  9:16 AM EDT ----- Regarding: RE: Patient advice request Ok to have home care evaluation.  If his fever persists or he develops any other symptoms he needs to go to the emergency department. Thanks ----- Message ----- From: Teodoro Spray, RN Sent: 08/20/2019   9:03 AM EDT To: Wyatt Portela, MD Subject: Patient advice request                         Received call from patient's mother stating patient has been running a temperature since early this AM. Last temp was 81 F. Patient denied cough, SOB, loss of taste, or tremors.  Patient states his pain has increased and "this is because I need more pain meds". Patient's mother is also requesting for home health services to assist patient with ADLs. Please advise. Thank you

## 2019-08-22 MED FILL — OXYCODONE-APAP 10-325: 10-325 | 20 days supply | Qty: 120 | Fill #0

## 2019-08-25 MED FILL — predniSONE 5 MG TABS: 5 | 90 days supply | Qty: 90 | Fill #1

## 2019-08-28 ENCOUNTER — Ambulatory Visit (HOSPITAL_COMMUNITY): Payer: Medicaid Other

## 2019-09-02 ENCOUNTER — Other Ambulatory Visit: Payer: Self-pay | Admitting: Oncology

## 2019-09-02 MED ORDER — OXYCODONE-ACETAMINOPHEN 10-325 MG PO TABS: 1.0000 | ORAL_TABLET | ORAL | 0 refills | Status: DC | PRN

## 2019-09-03 ENCOUNTER — Telehealth: Payer: Self-pay

## 2019-09-03 ENCOUNTER — Other Ambulatory Visit: Payer: Self-pay

## 2019-09-03 DIAGNOSIS — C7951 Secondary malignant neoplasm of bone: Secondary | ICD-10-CM

## 2019-09-03 DIAGNOSIS — C61 Malignant neoplasm of prostate: Secondary | ICD-10-CM

## 2019-09-03 NOTE — Telephone Encounter (Signed)
Contacted the patient and made him aware that per pharmacy his Percocet cannot be refilled until 11/9. Also explained that a referral has been sent to Dr. Maryjean Ka a pain management specialist. Explained that all the information was just sent today and the MD will review the patient information and if he feels that he can appropriately serve him for pain management their office will then call him to schedule an appointment. Explained that this process does take time so until that time Dr. Alen Blew will continue to prescribe the pain medication according to the current schedule. Patient verbalized understanding.

## 2019-09-03 NOTE — Progress Notes (Unsigned)
Received call from patient stating that even though the refill request for the Percocet has been sent to the pharmacy they stated that they cannot refill until 11/9. Explained to the patient that will follow up with [facility] outpt pharmacy and call back. Spoke with pharmacist Erasmo Downer at [facility] and she stated that she is concerned for the patient because he calls frequently, at times multiple times a day, requesting refills. She stated that this has been on-going and has explained to the patient on several occasions why they cannot refill the prescriptions early. Updated Dr. Alen Blew with the above infomration and receive verbal order for referral to pain clinic. Referral, demo/ins, and last OV note were faxed to Dr. Clydell Hakim with [facility] at [phone].

## 2019-09-04 ENCOUNTER — Ambulatory Visit: Payer: Self-pay | Admitting: Family Medicine

## 2019-09-04 ENCOUNTER — Encounter: Payer: Self-pay | Admitting: Family Medicine

## 2019-09-05 ENCOUNTER — Emergency Department (HOSPITAL_COMMUNITY)
Admission: EM | Admit: 2019-09-05 | Discharge: 2019-09-06 | Disposition: A | Discharge status: Home / Self Care | Payer: Medicaid Other | Attending: Emergency Medicine | Admitting: Emergency Medicine

## 2019-09-05 ENCOUNTER — Ambulatory Visit: Payer: Self-pay | Admitting: Family Medicine

## 2019-09-05 DIAGNOSIS — F1610 Hallucinogen abuse, uncomplicated: Secondary | ICD-10-CM | POA: Insufficient documentation

## 2019-09-05 DIAGNOSIS — Z79899 Other long term (current) drug therapy: Secondary | ICD-10-CM | POA: Diagnosis not present

## 2019-09-05 DIAGNOSIS — R509 Fever, unspecified: Secondary | ICD-10-CM | POA: Insufficient documentation

## 2019-09-05 DIAGNOSIS — G893 Neoplasm related pain (acute) (chronic): Secondary | ICD-10-CM | POA: Diagnosis not present

## 2019-09-05 DIAGNOSIS — J449 Chronic obstructive pulmonary disease, unspecified: Secondary | ICD-10-CM | POA: Diagnosis not present

## 2019-09-05 DIAGNOSIS — Z20828 Contact with and (suspected) exposure to other viral communicable diseases: Secondary | ICD-10-CM | POA: Insufficient documentation

## 2019-09-05 DIAGNOSIS — C61 Malignant neoplasm of prostate: Secondary | ICD-10-CM | POA: Insufficient documentation

## 2019-09-05 DIAGNOSIS — F1210 Cannabis abuse, uncomplicated: Secondary | ICD-10-CM | POA: Diagnosis not present

## 2019-09-05 DIAGNOSIS — F17210 Nicotine dependence, cigarettes, uncomplicated: Secondary | ICD-10-CM | POA: Diagnosis not present

## 2019-09-05 DIAGNOSIS — I10 Essential (primary) hypertension: Secondary | ICD-10-CM | POA: Diagnosis not present

## 2019-09-05 DIAGNOSIS — R52 Pain, unspecified: Secondary | ICD-10-CM | POA: Diagnosis present

## 2019-09-05 DIAGNOSIS — M898X9 Other specified disorders of bone, unspecified site: Secondary | ICD-10-CM | POA: Insufficient documentation

## 2019-09-05 DIAGNOSIS — C7951 Secondary malignant neoplasm of bone: Secondary | ICD-10-CM | POA: Diagnosis not present

## 2019-09-05 NOTE — ED Triage Notes (Signed)
Per EMS: Pt is coming from home with c/o generalized pain. Pt has a history of prostate and bone cancer and states he ran out of his pain medication. Pt is actively on chemo pills. Pt reports perineum, pelvis, and upper leg pain. Pt is sweating and tachycardic and has taken ibuprofen for pain with no relief.   MEDS with EMS 100 fentanyl  4 zofran   VITALS:  BP 150/84 HR 110 RR 20 SPO2 100% 2L CBG 131 TEMP 99.3

## 2019-09-06 ENCOUNTER — Emergency Department (HOSPITAL_COMMUNITY): Payer: Medicaid Other

## 2019-09-06 ENCOUNTER — Encounter (HOSPITAL_COMMUNITY): Payer: Self-pay | Admitting: Emergency Medicine

## 2019-09-06 ENCOUNTER — Other Ambulatory Visit: Payer: Self-pay

## 2019-09-06 LAB — CBC
HCT: 27.3 % — ABNORMAL LOW (ref 39.0–52.0)
Hemoglobin: 7.8 g/dL — ABNORMAL LOW (ref 13.0–17.0)
MCH: 23.6 pg — ABNORMAL LOW (ref 26.0–34.0)
MCHC: 28.6 g/dL — ABNORMAL LOW (ref 30.0–36.0)
MCV: 82.7 fL (ref 80.0–100.0)
Platelets: 575 10*3/uL — ABNORMAL HIGH (ref 150–400)
RBC: 3.3 MIL/uL — ABNORMAL LOW (ref 4.22–5.81)
RDW: 23.9 % — ABNORMAL HIGH (ref 11.5–15.5)
WBC: 11 10*3/uL — ABNORMAL HIGH (ref 4.0–10.5)
nRBC: 0 % (ref 0.0–0.2)

## 2019-09-06 LAB — URINALYSIS, ROUTINE W REFLEX MICROSCOPIC
Bilirubin Urine: NEGATIVE
Glucose, UA: NEGATIVE mg/dL
Hgb urine dipstick: NEGATIVE
Ketones, ur: NEGATIVE mg/dL
Leukocytes,Ua: NEGATIVE
Nitrite: NEGATIVE
Protein, ur: NEGATIVE mg/dL
Specific Gravity, Urine: 1.01 (ref 1.005–1.030)
pH: 6 (ref 5.0–8.0)

## 2019-09-06 LAB — COMPREHENSIVE METABOLIC PANEL
ALT: 9 U/L (ref 0–44)
AST: 159 U/L — ABNORMAL HIGH (ref 15–41)
Albumin: 2.6 g/dL — ABNORMAL LOW (ref 3.5–5.0)
Alkaline Phosphatase: 2551 U/L — ABNORMAL HIGH (ref 38–126)
Anion gap: 11 (ref 5–15)
BUN: 8 mg/dL (ref 6–20)
CO2: 22 mmol/L (ref 22–32)
Calcium: 8 mg/dL — ABNORMAL LOW (ref 8.9–10.3)
Chloride: 96 mmol/L — ABNORMAL LOW (ref 98–111)
Creatinine, Ser: 0.46 mg/dL — ABNORMAL LOW (ref 0.61–1.24)
GFR calc Af Amer: >60 mL/min (ref >60)
GFR calc non Af Amer: >60 mL/min (ref >60)
Glucose, Bld: 96 mg/dL (ref 70–99)
Potassium: 4.3 mmol/L (ref 3.5–5.1)
Sodium: 129 mmol/L — ABNORMAL LOW (ref 135–145)
Total Bilirubin: 1.9 mg/dL — ABNORMAL HIGH (ref 0.3–1.2)
Total Protein: 6.8 g/dL (ref 6.5–8.1)

## 2019-09-06 LAB — SARS CORONAVIRUS 2 (TAT 6-24 HRS): SARS Coronavirus 2: NEGATIVE

## 2019-09-06 MED ORDER — ONDANSETRON HCL 4 MG/2ML IJ SOLN
4.0000 mg | Freq: Once | INTRAMUSCULAR | Status: AC
  Administered 2019-09-06: 4 mg via INTRAVENOUS
  Filled 2019-09-06: qty 2

## 2019-09-06 MED ORDER — MORPHINE SULFATE (PF) 4 MG/ML IV SOLN
4.0000 mg | Freq: Once | INTRAVENOUS | Status: AC
  Administered 2019-09-06: 4 mg via INTRAVENOUS
  Filled 2019-09-06: qty 1

## 2019-09-06 MED ORDER — SODIUM CHLORIDE 0.9 % IV BOLUS
1000.0000 mL | Freq: Once | INTRAVENOUS | Status: AC
  Administered 2019-09-06: 1000 mL via INTRAVENOUS

## 2019-09-06 MED ORDER — HYDROMORPHONE HCL 1 MG/ML IJ SOLN
1.0000 mg | Freq: Once | INTRAMUSCULAR | Status: AC
  Administered 2019-09-06: 1 mg via INTRAVENOUS
  Filled 2019-09-06: qty 1

## 2019-09-06 MED ORDER — SODIUM CHLORIDE 0.9 % IV BOLUS (SEPSIS)
1000.0000 mL | Freq: Once | INTRAVENOUS | Status: AC
  Administered 2019-09-06: 1000 mL via INTRAVENOUS

## 2019-09-06 MED ORDER — OXYCODONE-ACETAMINOPHEN 5-325 MG PO TABS
2.0000 | ORAL_TABLET | Freq: Once | ORAL | Status: AC
  Administered 2019-09-06: 2 via ORAL
  Filled 2019-09-06: qty 2

## 2019-09-06 NOTE — ED Notes (Signed)
Pt was verbalized discharge instructions. Pt had no further questions at this time. NAD. 

## 2019-09-06 NOTE — ED Provider Notes (Signed)
[facility] DEPT Provider Note   CSN: [id number] Arrival date & time: 09/05/19  2347     History   Chief Complaint Chief Complaint  Patient presents with   Generalized Pain   Cancer    HPI Roy Rosales is a 53 y.o. male.     Patient with hx metastatic prostate ca, bone mets, chronic pain, c/u running out of his pain meds a few days ago, and having pain 'all over'. Symptoms acute onset, persistent, moderate, also states nausea, and has felt sweaty at times. Denies fevers at home, although temp in ED 100.3. occasional non prod cough. No known covid + exposure. No sore throat. No chest pain. No abd pain. No diarrhea. No dysuria or gu c/o. No rash/skin lesions.   The history is provided by the patient.    Past Medical History:  Diagnosis Date   COPD (chronic obstructive pulmonary disease) ([place])    Hypertension    Polysubstance abuse ([place])    Prostate cancer [facility])     Patient Active Problem List   Diagnosis Date Noted   Hypokalemia 08/04/2019   Intractable nausea and vomiting 08/04/2019   Normocytic anemia 08/04/2019   Chronic bilateral low back pain without sciatica    Metastatic cancer to spine ([place]) 04/16/2019   Unilateral primary osteoarthritis, right knee 04/15/2019   Metastatic cancer [facility])    Palliative care by specialist    Cancer associated pain    DNR (do not resuscitate)    Goals of care, counseling/discussion 12/20/2018   Prostate cancer metastatic to bone ([place]) 12/02/2018   Urgency of urination 11/21/2018   Elevated PSA 11/21/2018   Elevated alkaline phosphatase level 11/21/2018   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 Authorizing Provider  abiraterone acetate (ZYTIGA) 250 MG tablet TAKE 4 TABLETS (1,000 MG TOTAL) BY MOUTH DAILY. TAKE ON AN EMPTY STOMACH 1 HOUR BEFORE OR 2 HOURS AFTER A MEAL 08/13/19   Wyatt Portela, MD  albuterol (PROVENTIL HFA;VENTOLIN HFA) 108 (90 Base) MCG/ACT inhaler Inhale 2 puffs into the lungs every 4 (four) hours as needed. Patient taking differently: Inhale 2 puffs into the lungs every 4 (four) hours as needed for wheezing or shortness of breath.  11/20/18   Elsie Stain, MD  atenolol (TENORMIN) 100 MG tablet Take 1 tablet (100 mg total) by mouth daily. 11/20/18   Elsie Stain, MD  dextromethorphan-guaiFENesin [facility] DM) 30-600 MG 12hr tablet Take 1 tablet by mouth 2 (two) times daily as needed for cough. 08/05/19   Nita Sells, MD  folic acid (FOLVITE) 1 MG tablet Take 1 tablet (1 mg total) by mouth daily. 08/05/19   Nita Sells, MD  gabapentin (NEURONTIN) 300 MG capsule Take 1 capsule (300 mg total) by mouth 3 (three) times daily. Start with 1 pill at bedtime for the first week then increase if tolerated 01/01/19   Fulp, Cammie, MD  levETIRAcetam (KEPPRA) 500 MG  tablet Take 1 tablet (500 mg total) by mouth 2 (two) times daily. 11/20/18   Elsie Stain, MD  morphine (MS CONTIN) 60 MG 12 hr tablet Take 1 tablet (60 mg total) by mouth every 8 (eight) hours. 08/15/19   Wyatt Portela, MD  ondansetron (ZOFRAN ODT) 8 MG disintegrating tablet 8mg  ODT q8 hours prn nausea 08/04/19   Palumbo, April, MD  oxyCODONE-acetaminophen (PERCOCET) 10-325 MG tablet Take 1 tablet by mouth every 4 (four) hours as needed for pain. 09/02/19   Wyatt Portela, MD  polyethylene glycol powder (GLYCOLAX/MIRALAX) powder Take 17 g by mouth daily. Mixed with at least 8  ounces of water to treat constipation Patient taking differently: Take 17 g by mouth daily as needed for mild constipation. Mixed with at least 8 ounces of water to treat constipation 01/01/19   Fulp, Cammie, MD  potassium chloride SA (KLOR-CON) 20 MEQ tablet Take 2 tablets (40 mEq total) by mouth daily. 08/05/19   Nita Sells, MD  predniSONE (DELTASONE) 5 MG tablet Take 1 tablet (5 mg total) by mouth daily with breakfast. 05/30/19   Wyatt Portela, MD  valACYclovir (VALTREX) 500 MG tablet Take 500 mg by mouth daily. 09/19/18   [provider]    Family History Family History  Problem Relation Age of Onset   Breast cancer Mother    Hypertension Mother    Cancer Sister    Prostate cancer Father    Prostate cancer Brother    Stomach cancer Brother    Seizures Neg Hx     Social History Social History   Tobacco Use   Smoking status: Current Every Day Smoker    Packs/day: 1.00    Types: Cigarettes   Smokeless tobacco: Never Used  Substance Use Topics   Alcohol use: Yes    Comment: 6-12+ beer/day; update 09/12/18 none since 09/07/18, plans to quit, was drinking 5-6 beers per day   Drug use: Yes    Types: Marijuana, PCP    Comment: PCP for about 3 months, none since 2017 or 2018     Allergies   Patient has no known allergies.   Review of Systems Review of Systems  Constitutional: Positive for fever. Negative for chills.  HENT: Negative for sore throat.   Eyes: Negative for redness.  Respiratory: Positive for cough. Negative for shortness of breath.   Cardiovascular: Negative for chest pain.  Gastrointestinal: Negative for abdominal pain, diarrhea and vomiting.  Endocrine: Negative for polyuria.  Genitourinary: Negative for dysuria and flank pain.  Musculoskeletal: Positive for arthralgias and myalgias.  Skin: Negative for rash.  Neurological: Negative for headaches.  Hematological: Does not bruise/bleed easily.  Psychiatric/Behavioral: Negative  for confusion.     Physical Exam Updated Vital Signs BP 137/74 (BP Location: Left Arm)    Pulse (!) 110    Temp 100.3 F (37.9 C) (Oral)    Resp 18    SpO2 100%   Physical Exam Vitals signs and nursing note reviewed.  Constitutional:      Appearance: Normal appearance. He is well-developed.  HENT:     Head: Atraumatic.     Nose: Nose normal.     Mouth/Throat:     Mouth: Mucous membranes are moist.     Pharynx: Oropharynx is clear.  Eyes:     General: No scleral icterus.    Conjunctiva/sclera: Conjunctivae normal.  Neck:     Musculoskeletal: Normal range of motion and neck supple. No neck rigidity.  Trachea: No tracheal deviation.     Comments: No stiffness or rigidity.  Cardiovascular:     Rate and Rhythm: Normal rate and regular rhythm.     Pulses: Normal pulses.     Heart sounds: Normal heart sounds. No murmur. No friction rub. No gallop.   Pulmonary:     Effort: Pulmonary effort is normal. No accessory muscle usage or respiratory distress.     Breath sounds: Normal breath sounds.  Abdominal:     General: Bowel sounds are normal. There is no distension.     Palpations: Abdomen is soft.     Tenderness: There is no abdominal tenderness. There is no guarding.  Genitourinary:    Comments: No cva tenderness. Musculoskeletal:        General: No swelling.  Skin:    General: Skin is warm and dry.     Findings: No rash.  Neurological:     Mental Status: He is alert.     Comments: Alert, speech clear.   Psychiatric:        Mood and Affect: Mood normal.      ED Treatments / Results  Labs (all labs ordered are listed, but only abnormal results are displayed) Results for orders placed or performed in visit on 08/15/19  CMP ([place] only)  Result Value Ref Range   Sodium 135 135 - 145 mmol/L   Potassium 3.1 (L) 3.5 - 5.1 mmol/L   Chloride 97 (L) 98 - 111 mmol/L   CO2 27 22 - 32 mmol/L   Glucose, Bld 90 70 - 99 mg/dL   BUN 10 6 - 20 mg/dL   Creatinine 0.57  (L) 0.61 - 1.24 mg/dL   Calcium 9.0 8.9 - 10.3 mg/dL   Total Protein 7.3 6.5 - 8.1 g/dL   Albumin 2.8 (L) 3.5 - 5.0 g/dL   AST 48 (H) 15 - 41 U/L   ALT 8 0 - 44 U/L   Alkaline Phosphatase 3,414 (H) 38 - 126 U/L   Total Bilirubin 0.9 0.3 - 1.2 mg/dL   GFR, Est Non Af Am >60 >60 mL/min   GFR, Est AFR Am >60 >60 mL/min   Anion gap 11 5 - 15  Prostate-Specific AG, Serum  Result Value Ref Range   Prostate Specific Ag, Serum 50.6 (H) 0.0 - 4.0 ng/mL  CBC with Differential (Cancer Center Only)  Result Value Ref Range   WBC Count 7.1 4.0 - 10.5 K/uL   RBC 3.63 (L) 4.22 - 5.81 MIL/uL   Hemoglobin 8.7 (L) 13.0 - 17.0 g/dL   HCT 28.7 (L) 39.0 - 52.0 %   MCV 79.1 (L) 80.0 - 100.0 fL   MCH 24.0 (L) 26.0 - 34.0 pg   MCHC 30.3 30.0 - 36.0 g/dL   RDW 22.7 (H) 11.5 - 15.5 %   Platelet Count 454 (H) 150 - 400 K/uL   nRBC 0.6 (H) 0.0 - 0.2 %   Neutrophils Relative % 76 %   Neutro Abs 5.3 1.7 - 7.7 K/uL   Lymphocytes Relative 12 %   Lymphs Abs 0.9 0.7 - 4.0 K/uL   Monocytes Relative 11 %   Monocytes Absolute 0.8 0.1 - 1.0 K/uL   Eosinophils Relative 0 %   Eosinophils Absolute 0.0 0.0 - 0.5 K/uL   Basophils Relative 0 %   Basophils Absolute 0.0 0.0 - 0.1 K/uL   Immature Granulocytes 1 %   Abs Immature Granulocytes 0.05 0.00 - 0.07 K/uL   Xr Chest Portable  Result Date: 09/06/2019 CLINICAL DATA:  Generalized pain EXAM: PORTABLE CHEST 1 VIEW COMPARISON:  September 07, 2018 FINDINGS: There are diffuse osseous metastatic lesions that appear primarily sclerotic. There is a mixed sclerotic and lytic lesion involving the proximal left humerus. The heart size is normal. There is no focal infiltrate. No pneumothorax. No definite displaced fracture. IMPRESSION: 1. No acute cardiopulmonary process. 2. Widely metastatic disease throughout the visualized osseous structures. Electronically Signed   By: Constance Holster M.D.   On: 09/06/2019 00:19    EKG None  Radiology Xr Chest Portable  Result Date:  09/06/2019 CLINICAL DATA:  Generalized pain EXAM: PORTABLE CHEST 1 VIEW COMPARISON:  September 07, 2018 FINDINGS: There are diffuse osseous metastatic lesions that appear primarily sclerotic. There is a mixed sclerotic and lytic lesion involving the proximal left humerus. The heart size is normal. There is no focal infiltrate. No pneumothorax. No definite displaced fracture. IMPRESSION: 1. No acute cardiopulmonary process. 2. Widely metastatic disease throughout the visualized osseous structures. Electronically Signed   By: Constance Holster M.D.   On: 09/06/2019 00:19    Procedures Procedures (including critical care time)  Medications Ordered in ED Medications  sodium chloride 0.9 % bolus 1,000 mL (1,000 mLs Intravenous New Bag/Given 09/06/19 0012)  morphine 4 MG/ML injection 4 mg (4 mg Intravenous Given 09/06/19 0011)  ondansetron (ZOFRAN) injection 4 mg (4 mg Intravenous Given 09/06/19 0011)  oxyCODONE-acetaminophen (PERCOCET/ROXICET) 5-325 MG per tablet 2 tablet (2 tablets Oral Given 09/06/19 0039)     Initial Impression / Assessment and Plan / ED Course  I have reviewed the triage vital signs and the nursing notes.  Pertinent labs & imaging results that were available during my care of the patient were reviewed by me and considered in my medical decision making (see chart for details).  Iv ns bolus. Morphine iv. Labs sent. Cxr.  Reviewed nursing notes and prior charts for additional history.   CXR reviewed/interpreted by me - no pna, +mets.  Labs pending.   Recheck pt, pain improved, but persists.   Percocet 2 po.   [phone] - signed out to Dr Leonides Schanz to check labs, recheck pt, and dispo appropriately.     Final Clinical Impressions(s) / ED Diagnoses   Final diagnoses:  Prostate cancer metastatic to bone [facility])  Malignant bone pain  Low grade fever    ED Discharge Orders    None       Lajean Saver, MD 09/06/19 [phone]

## 2019-09-06 NOTE — ED Notes (Signed)
I have just confirmed with [facility]. That [facility] is slated to transport pt. Home.

## 2019-09-06 NOTE — Discharge Instructions (Addendum)
It was our pleasure to provide your ER care today - we hope that you feel better.  Follow up with your doctor Monday should you need additional pain medication - the records in our system indicate that you next prescription has already been sent to your pharmacy, but that they will not be able to fill it until your due date. Also follow up with pain specialist as planned.   Given your cough and low grade fever, we did send a covid test - the results should be back tomorrow - you may check My Chart, and/or call for results. Please quarantine self until test results are known.  Your chest x-ray and urine results were normal today.  Return to ER if worse, new symptoms, high fevers, new/worsening or intractable pain, persistent vomiting, trouble breathing, or other concern.

## 2019-09-06 NOTE — ED Notes (Addendum)
Pt is inquiring about prescription pain medication. Provider made aware.

## 2019-09-06 NOTE — ED Notes (Signed)
Attempted to in and out cath unsuccessfully. Bladder scanner showed 26 ml.

## 2019-09-06 NOTE — ED Provider Notes (Signed)
12:43 AM  Assumed care from Dr. Ashok Cordia.  Patient is a 53 y.o. M with metastatic prostate cancer.  Patient out of percocet for a few days.  Has pain all over.  Also low grade fever and nonproductive cough.  CXR clear.  Labs, urine and COVID sent.  It appears patient does have a prescription for Percocet at his pharmacy that would not be able to be filled until 09/08/2019.  4:55 AM  Pt's urine shows no sign of infection.  He does have a mild leukocytosis of 11,000.  Hemoglobin is 7.8 which appears to be near patient's baseline.  Sodium slightly low at 129.  He has been given IV hydration.  His alkaline phosphatase is elevated consistent with his history of bony metastasis.  His pain has been difficult to control here.  I suspect there is some component of drug-seeking behavior.  There is a note from his doctors and pharmacist stating that he frequently runs out of pain medication before his refill is ready and because the pharmacy multiple times.  He has been referred to pain management.  He has a prescription at the pharmacy that can be filled on 09/08/2019.  I do not feel he needs to be sent home with further narcotic pain medication.  I feel patient is safe to be discharged home.  His COVID-19 testing is pending.  No signs that he needs antibiotics at this time.  He is chronically ill-appearing but does not appear toxic, septic today.  At this time, I do not feel there is any life-threatening condition present. I have reviewed, interpreted and discussed all results (EKG, imaging, lab, urine as appropriate) and exam findings with patient/family. I have reviewed nursing notes and appropriate previous records.  I feel the patient is safe to be discharged home without further emergent workup and can continue workup as an outpatient as needed. Discussed usual and customary return precautions. Patient/family verbalize understanding and are comfortable with this plan.  Outpatient follow-up has been provided as needed.  All questions have been answered.    Per Oncology note 08/15/2019:  "Principle Diagnosis: 53year-old man with advanced prostate cancer with disease to the bone and lymphadenopathy diagnosed in February 2020.  He was found to have Gleason score 5+4 = 9 and a PSA of 335 with castration-resistant currently.  Prior Therapy:  He is status post prostate biopsy completed in February 2020.  Confirmed the presence of prostate adenocarcinoma Gleason score 5+4 = 9.  He is status post radiation therapy to the thoracic spine.  He completed 30 Gy in 10 fractions in March 2020.  Firmagon 240 mg given in February 2020.  Taxotere chemotherapy 75 mg per metered square started on 01/02/2019.  He completed 6 cycles of therapy.   Current therapy: Zytiga 1000 mg daily with prednisone started in August 2020.   Lupron 30 mg every 4 months started on 01/23/2019.  His next Eligard will be in December 2020."   Kita Neace, Delice Bison, DO 09/06/19 [phone]

## 2019-09-07 LAB — URINE CULTURE: Culture: NO GROWTH

## 2019-09-08 ENCOUNTER — Other Ambulatory Visit: Payer: Self-pay | Admitting: Oncology

## 2019-09-08 MED FILL — ONDANSETRON HCL 8 MG TABLET: 8 | 14 days supply | Qty: 40 | Fill #0

## 2019-09-08 MED FILL — OXYCODONE-APAP 10-325: 10-325 | 20 days supply | Qty: 120 | Fill #0

## 2019-09-12 ENCOUNTER — Other Ambulatory Visit: Payer: Self-pay | Admitting: Oncology

## 2019-09-15 MED FILL — MORPHINE SULF 60 MG TAB SA: 60 | 30 days supply | Qty: 90 | Fill #0

## 2019-09-15 MED FILL — ABIRATERONE ACETATE 250 MG: 250 | 30 days supply | Qty: 120 | Fill #0

## 2019-09-16 ENCOUNTER — Telehealth: Payer: Self-pay

## 2019-09-16 ENCOUNTER — Inpatient Hospital Stay: Payer: Medicaid Other | Admitting: Oncology

## 2019-09-16 ENCOUNTER — Inpatient Hospital Stay: Payer: Medicaid Other

## 2019-09-16 NOTE — Telephone Encounter (Signed)
Patient called to cancel today's appointments and would like to reschedule. Dr. Alen Blew made aware and patient rescheduled for Thursday 11/19 with lab at 12:30 followed by a visit with Dr. Alen Blew at 1:00. Patient verbalized understanding of appointment day and time.

## 2019-09-18 ENCOUNTER — Inpatient Hospital Stay (HOSPITAL_BASED_OUTPATIENT_CLINIC_OR_DEPARTMENT_OTHER): Payer: Medicaid Other | Admitting: Oncology

## 2019-09-18 ENCOUNTER — Inpatient Hospital Stay: Payer: Medicaid Other | Attending: Oncology

## 2019-09-18 ENCOUNTER — Other Ambulatory Visit: Payer: Self-pay

## 2019-09-18 ENCOUNTER — Other Ambulatory Visit: Payer: Self-pay | Admitting: Oncology

## 2019-09-18 VITALS — BP 111/69 | HR 105 | Temp 98.0°F | Resp 18 | Ht 73.0 in

## 2019-09-18 DIAGNOSIS — Z79899 Other long term (current) drug therapy: Secondary | ICD-10-CM | POA: Insufficient documentation

## 2019-09-18 DIAGNOSIS — R102 Pelvic and perineal pain: Secondary | ICD-10-CM | POA: Diagnosis not present

## 2019-09-18 DIAGNOSIS — R634 Abnormal weight loss: Secondary | ICD-10-CM | POA: Diagnosis not present

## 2019-09-18 DIAGNOSIS — R9721 Rising PSA following treatment for malignant neoplasm of prostate: Secondary | ICD-10-CM | POA: Insufficient documentation

## 2019-09-18 DIAGNOSIS — G8929 Other chronic pain: Secondary | ICD-10-CM | POA: Insufficient documentation

## 2019-09-18 DIAGNOSIS — C7951 Secondary malignant neoplasm of bone: Secondary | ICD-10-CM | POA: Diagnosis present

## 2019-09-18 DIAGNOSIS — C61 Malignant neoplasm of prostate: Secondary | ICD-10-CM | POA: Diagnosis present

## 2019-09-18 DIAGNOSIS — Z923 Personal history of irradiation: Secondary | ICD-10-CM | POA: Insufficient documentation

## 2019-09-18 DIAGNOSIS — Z9221 Personal history of antineoplastic chemotherapy: Secondary | ICD-10-CM | POA: Insufficient documentation

## 2019-09-18 DIAGNOSIS — Z7401 Bed confinement status: Secondary | ICD-10-CM | POA: Insufficient documentation

## 2019-09-18 LAB — CBC WITH DIFFERENTIAL (CANCER CENTER ONLY)
Abs Immature Granulocytes: 0.08 10*3/uL — ABNORMAL HIGH (ref 0.00–0.07)
Basophils Absolute: 0 10*3/uL (ref 0.0–0.1)
Basophils Relative: 0 %
Eosinophils Absolute: 0 10*3/uL (ref 0.0–0.5)
Eosinophils Relative: 0 %
HCT: 26.7 % — ABNORMAL LOW (ref 39.0–52.0)
Hemoglobin: 7.7 g/dL — ABNORMAL LOW (ref 13.0–17.0)
Immature Granulocytes: 1 %
Lymphocytes Relative: 9 %
Lymphs Abs: 0.8 10*3/uL (ref 0.7–4.0)
MCH: 24.1 pg — ABNORMAL LOW (ref 26.0–34.0)
MCHC: 28.8 g/dL — ABNORMAL LOW (ref 30.0–36.0)
MCV: 83.7 fL (ref 80.0–100.0)
Monocytes Absolute: 0.7 10*3/uL (ref 0.1–1.0)
Monocytes Relative: 8 %
Neutro Abs: 7.3 10*3/uL (ref 1.7–7.7)
Neutrophils Relative %: 82 %
Platelet Count: 513 10*3/uL — ABNORMAL HIGH (ref 150–400)
RBC: 3.19 MIL/uL — ABNORMAL LOW (ref 4.22–5.81)
RDW: 24.6 % — ABNORMAL HIGH (ref 11.5–15.5)
WBC Count: 8.9 10*3/uL (ref 4.0–10.5)
nRBC: 0.7 % — ABNORMAL HIGH (ref 0.0–0.2)

## 2019-09-18 LAB — CMP (CANCER CENTER ONLY)
ALT: 8 U/L (ref 0–44)
AST: 73 U/L — ABNORMAL HIGH (ref 15–41)
Albumin: 2.3 g/dL — ABNORMAL LOW (ref 3.5–5.0)
Alkaline Phosphatase: 4160 U/L — ABNORMAL HIGH (ref 38–126)
Anion gap: 10 (ref 5–15)
BUN: 14 mg/dL (ref 6–20)
CO2: 23 mmol/L (ref 22–32)
Calcium: 8.3 mg/dL — ABNORMAL LOW (ref 8.9–10.3)
Chloride: 98 mmol/L (ref 98–111)
Creatinine: 0.56 mg/dL — ABNORMAL LOW (ref 0.61–1.24)
GFR, Est AFR Am: >60 mL/min (ref >60)
GFR, Estimated: >60 mL/min (ref >60)
Glucose, Bld: 114 mg/dL — ABNORMAL HIGH (ref 70–99)
Potassium: 3.5 mmol/L (ref 3.5–5.1)
Sodium: 131 mmol/L — ABNORMAL LOW (ref 135–145)
Total Bilirubin: 0.8 mg/dL (ref 0.3–1.2)
Total Protein: 7.4 g/dL (ref 6.5–8.1)

## 2019-09-18 MED ORDER — OXYCODONE HCL 10 MG PO TABS: 10.0000 mg | ORAL_TABLET | ORAL | 0 refills | Status: DC | PRN

## 2019-09-18 MED FILL — oxyCODONE HCL 10 MG TABS: 10 | 10 days supply | Qty: 120 | Fill #0

## 2019-09-18 NOTE — Progress Notes (Signed)
Hematology and Oncology Follow Up Visit  Roy Rosales [id number] 1966/10/27 53 y.o. 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  At that time he was found to have Gleason score 5+4 = 9 and a PSA of 335 advanced disease.  Prior Therapy:  He is status post prostate biopsy completed in February 2020.  Confirmed the presence of prostate adenocarcinoma Gleason score 5+4 = 9.  He is status post radiation therapy to the thoracic spine.  He completed 30 Gy in 10 fractions in March 2020.  Firmagon 240 mg given in February 2020.  Taxotere chemotherapy 75 mg per metered square started on 01/02/2019.  He completed 6 cycles of therapy.   Current therapy: Zytiga 1000 mg daily with prednisone started in August 2020.   Lupron 30 mg every 4 months started on 01/23/2019.  His next Eligard will be in December 2020.  Interim History: Roy Rosales is here for a follow-up visit.  Since the last visit, he reports continuous decline in his overall health.  He is limited in his mobility and for the most part chair and bedbound.  He reports continuous issues with chronic pain predominantly in his pelvic and lower extremities.  His pain is not specific and appears to be improved when he takes breakthrough pain medication.  He continues to be on morphine 30 mg every 8 hours.  He continues to lose weight and his performance status is declining.   Patient denied any alteration mental status, neuropathy, confusion or dizziness.  Denies any headaches or lethargy.  Denies any night sweats.  Denied orthopnea, dyspnea on exertion or chest discomfort.  Denies shortness of breath, difficulty breathing hemoptysis or cough.  Denies any abdominal distention, nausea, early satiety or dyspepsia.  Denies any hematuria, frequency, dysuria or nocturia.  Denies any skin  irritation, dryness or rash.  Denies any ecchymosis or petechiae.  Denies any lymphadenopathy or clotting.  Denies any heat or cold intolerance.  Denies any anxiety or depression.  Remaining review of system is negative.               Medications: Unchanged on review. Current Outpatient Medications  Medication Sig Dispense Refill  . abiraterone acetate (ZYTIGA) 250 MG tablet TAKE 4 TABLETS (1,000 MG TOTAL) BY MOUTH DAILY. TAKE ON AN EMPTY STOMACH 1 HOUR BEFORE OR 2 HOURS AFTER A MEAL 120 tablet 0  . albuterol (PROVENTIL HFA;VENTOLIN HFA) 108 (90 Base) MCG/ACT inhaler Inhale 2 puffs into the lungs every 4 (four) hours as needed. (Patient taking differently: Inhale 2 puffs into the lungs every 4 (four) hours as needed for wheezing or shortness of breath. ) 1 Inhaler 2  . atenolol (TENORMIN) 100 MG tablet Take 1 tablet (100 mg total) by mouth daily. 30 tablet 6  . dextromethorphan-guaiFENesin (MUCINEX DM) 30-600 MG 12hr tablet Take 1 tablet by mouth 2 (two) times daily as needed for cough. 60 tablet 0  . folic acid (FOLVITE) 1 MG tablet Take 1 tablet (1 mg total) by mouth daily. 30 tablet 0  . gabapentin (NEURONTIN) 300 MG capsule Take 1 capsule (300 mg total) by mouth 3 (three) times daily. Start with 1 pill at bedtime for the first week then increase if tolerated (Patient taking differently: Take 300 mg by mouth 3 (three) times daily. ) 90 capsule 6  . levETIRAcetam (KEPPRA) 500 MG tablet Take 1 tablet (  500 mg total) by mouth 2 (two) times daily. 60 tablet 11  . morphine (MS CONTIN) 60 MG 12 hr tablet TAKE 1 TABLET (60 MG TOTAL) BY MOUTH EVERY 8 (EIGHT) HOURS. 90 tablet 0  . ondansetron (ZOFRAN ODT) 8 MG disintegrating tablet 8mg  ODT q8 hours prn nausea (Patient not taking: Reported on 09/06/2019) 8 tablet 0  . ondansetron (ZOFRAN) 8 MG tablet TAKE 1 TABLET (8 MG) BY MOUTH EVERY 8 HOURS AS NEEDED FOR NAUSEA OR VOMITING 40 tablet 1  . oxyCODONE-acetaminophen (PERCOCET) 10-325 MG tablet Take  1 tablet by mouth every 4 (four) hours as needed for pain. 120 tablet 0  . polyethylene glycol (MIRALAX / GLYCOLAX) 17 g packet Take 17 g by mouth daily as needed for mild constipation.    . polyethylene glycol powder (GLYCOLAX/MIRALAX) powder Take 17 g by mouth daily. Mixed with at least 8 ounces of water to treat constipation (Patient not taking: Reported on 09/06/2019) 3350 g 11  . potassium chloride SA (KLOR-CON) 20 MEQ tablet Take 2 tablets (40 mEq total) by mouth daily. 60 tablet 0  . predniSONE (DELTASONE) 5 MG tablet Take 1 tablet (5 mg total) by mouth daily with breakfast. (Patient not taking: Reported on 09/06/2019) 90 tablet 3  . triamterene-hydrochlorothiazide (MAXZIDE) 75-50 MG tablet Take 1 tablet by mouth daily.    . valACYclovir (VALTREX) 500 MG tablet Take 500 mg by mouth daily as needed (out break).      No current facility-administered medications for this visit.      Allergies: No Known Allergies  Past Medical History, Surgical history, Social history, and Family History without any changes on review.   Physical Exam:   Blood pressure 111/69, pulse (!) 105, temperature 98 F (36.7 C), temperature source Temporal, resp. rate 18, height 6\' 1"  (1.854 m), SpO2 100 %.      ECOG: 2   General appearance: Comfortable appearing without any discomfort.  Appeared chronically ill. Head: Normocephalic without any trauma Oropharynx: Mucous membranes are moist and pink without any thrush or ulcers. Eyes: Pupils are equal and round reactive to light. Lymph nodes: No cervical, supraclavicular, inguinal or axillary lymphadenopathy.   Heart:regular rate and rhythm.  S1 and S2 without leg edema. Lung: Clear without any rhonchi or wheezes.  No dullness to percussion. Abdomin: Soft, nontender, nondistended with good bowel sounds.  No hepatosplenomegaly. Musculoskeletal: No joint deformity or effusion.  Full range of motion noted. Neurological: No deficits noted moving all  extremities.  Difficult to assess because of increased pain in his lower extremities. Skin: No petechial rash or dryness.  Appeared moist.                 Lab Results: Lab Results  Component Value Date   WBC 11.0 (H) 09/06/2019   HGB 7.8 (L) 09/06/2019   HCT 27.3 (L) 09/06/2019   MCV 82.7 09/06/2019   PLT 575 (H) 09/06/2019     Chemistry      Component Value Date/Time   NA 129 (L) 09/06/2019 0018   NA 134 11/20/2018 1138   K 4.3 09/06/2019 0018   CL 96 (L) 09/06/2019 0018   CO2 22 09/06/2019 0018   BUN 8 09/06/2019 0018   BUN 11 11/20/2018 1138   CREATININE 0.46 (L) 09/06/2019 0018   CREATININE 0.57 (L) 08/15/2019 0908      Component Value Date/Time   CALCIUM 8.0 (L) 09/06/2019 0018   ALKPHOS 2,551 (H) 09/06/2019 0018   AST 159 (H) 09/06/2019 0018  AST 48 (H) 08/15/2019 0908   ALT 9 09/06/2019 0018   ALT 8 08/15/2019 0908   BILITOT 1.9 (H) 09/06/2019 0018   BILITOT 0.9 08/15/2019 0908        Results for Roy Rosales, Roy Rosales (MRN [id number]) as of 09/18/2019 13:03  Ref. Range 07/03/2019 15:02 08/15/2019 09:08  Prostate Specific Ag, Serum Latest Ref Range: 0.0 - 4.0 ng/mL 15.0 (H) 50.6 (H)        Impression and Plan:  53 year old man with:  1.    Castration-resistant prostate cancer with disease to the bone and lymphadenopathy diagnosed in February 2020.  He is currently on Zytiga which she has tolerated reasonably well although he did have a rapid rise in his PSA shortly after modest response.  His PSA went up from 15 up to 50 indicating a rapid rise of his cancer volume.  He is quite debilitated with poor performance status and cachexia.  He is barely able to make it to his appointment today and requiring an ambulance transportation.  Based on these findings, I see that he is rapidly progressing and approaching end-stage status.  I do not feel that different salvage therapy at this time will likely reverse the rapid progression he is experiencing.  At  this time, I have recommended discontinuation of Zytiga and proceeding with hospice.  Jevtana chemotherapy would be the only option that could be used but given his rapid progression and deterioration in his performance status it is unlikely to reverse the course and offer much palliation.  He will think about the hospice enrollment at this time and let me know in the near future.  In the meantime we will switch to supportive care only.   2.    Weight loss: Related to malignancy.  We have discussed adding nutritional supplements and supportive management..  3.  Androgen deprivation: Risks and benefits of resuming androgen deprivation was discussed.  Given the fact that he is approaching end-stage status, I recommended discontinuation of androgen deprivation.  4.  Pain: He continues to be on morphine although still requiring a fair amount of breakthrough pain medication.  I will increase the dose of his breakthrough oxycodone to eliminate Tylenol from his breakthrough.  This pain appears to be diffuse in nature I do not feel that radiation would help at this time.  5.  Prognosis and goals of care: This was discussed in detail today.  He understands that he is approaching end-stage status with limited life expectancy.  I recommended hospice enrollment consideration for no CODE BLUE case of hospitalization.  He will consider those options and let me know.  6.  Follow-up: 4 weeks for repeat evaluation.  25  minutes was spent with the patient face-to-face today.  More than 50% of time was spent on reviewing his disease status, treatment options as well as addressing future plan of care and advanced directives.    Zola Button, MD 11/19/20201:00 PM

## 2019-09-19 ENCOUNTER — Telehealth: Payer: Self-pay | Admitting: Oncology

## 2019-09-19 LAB — PROSTATE-SPECIFIC AG, SERUM (LABCORP): Prostate Specific Ag, Serum: 63.9 ng/mL — ABNORMAL HIGH (ref 0.0–4.0)

## 2019-09-19 NOTE — Telephone Encounter (Signed)
Scheduled appt per 11/19 los.  Spoke with pt and he is aware of his appt date and time,.

## 2019-09-24 ENCOUNTER — Telehealth: Payer: Self-pay

## 2019-09-24 NOTE — Telephone Encounter (Signed)
Received fax that patient has a new patient appointment on 11/25 at 10:30 with Dr. Maryjean Ka at 10:30 at [facility] for pain management.

## 2019-09-26 ENCOUNTER — Other Ambulatory Visit: Payer: Self-pay | Admitting: Oncology

## 2019-09-26 MED FILL — oxyCODONE HCL 10 MG TABS: 10 | 10 days supply | Qty: 120 | Fill #0

## 2019-10-01 ENCOUNTER — Other Ambulatory Visit: Payer: Self-pay

## 2019-10-01 DIAGNOSIS — C7951 Secondary malignant neoplasm of bone: Secondary | ICD-10-CM

## 2019-10-01 DIAGNOSIS — C61 Malignant neoplasm of prostate: Secondary | ICD-10-CM

## 2019-10-01 NOTE — Progress Notes (Signed)
Received a call from patient's sister Memphis Shreiner stating patient is ready for hospice services. Spoke to Amy at [facility] and hospice referral placed.

## 2019-10-16 ENCOUNTER — Inpatient Hospital Stay: Payer: Medicaid Other

## 2019-10-16 ENCOUNTER — Inpatient Hospital Stay: Payer: Medicaid Other | Admitting: Oncology

## 2019-10-31 DEATH — deceased

## 2020-01-10 ENCOUNTER — Other Ambulatory Visit: Payer: Self-pay | Admitting: Nurse Practitioner

## 2020-10-21 IMAGING — CT CT ABD-PELV W/ CM
2 of 5 series · 15 of 46 positions shown, 17 images · IV contrast (Omni 300)
Comparison: None.

CLINICAL DATA: Abdominal pain and bone pain. Evaluate for prostate
cancer metastasis. Adenopathy.

EXAM:
CT ABDOMEN AND PELVIS WITH CONTRAST
TECHNIQUE: Multidetector CT imaging of the abdomen and pelvis was performed
using the standard protocol following bolus administration of
intravenous contrast.
CONTRAST:  100mL OMNIPAQUE IOHEXOL 300 MG/ML  SOLN

[Series 3: a/p w/ 5mm · axial · 0.91mm/px · z∈[+840,+1296]mm · 12 of 103 slices shown, 14 images]
[im 6/103  soft-tissue]
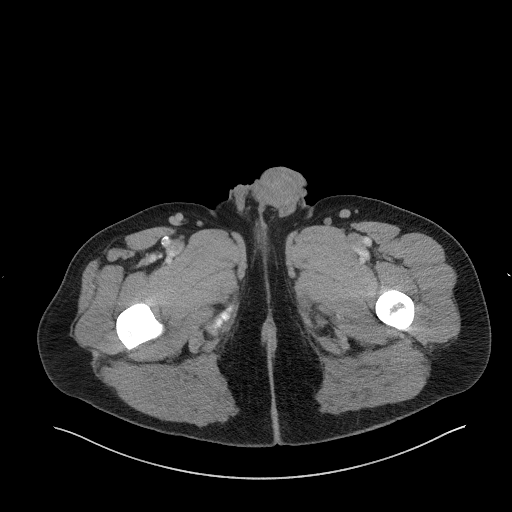
[im 6/103  bone]
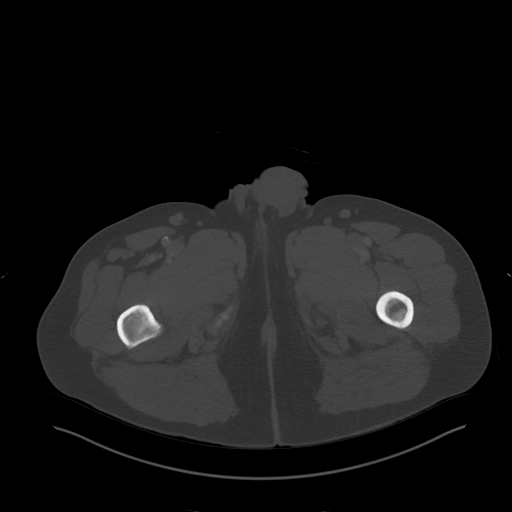
[im 17/103  soft-tissue]
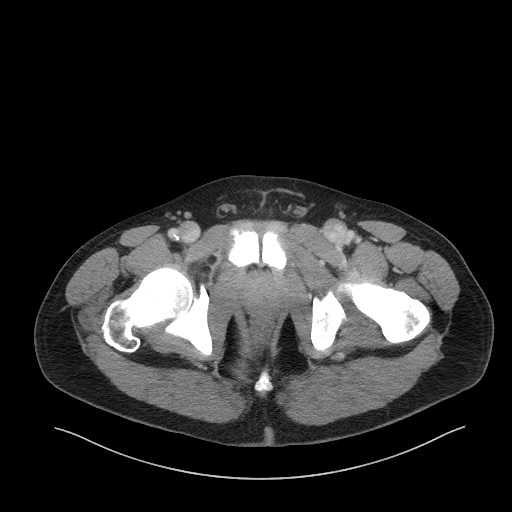
[im 22/103  soft-tissue]
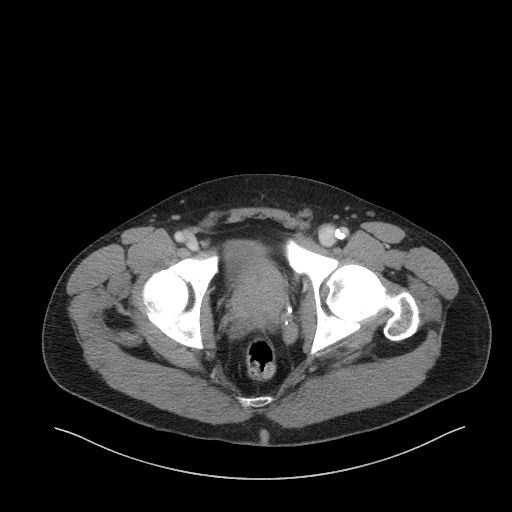
[im 33/103  soft-tissue]
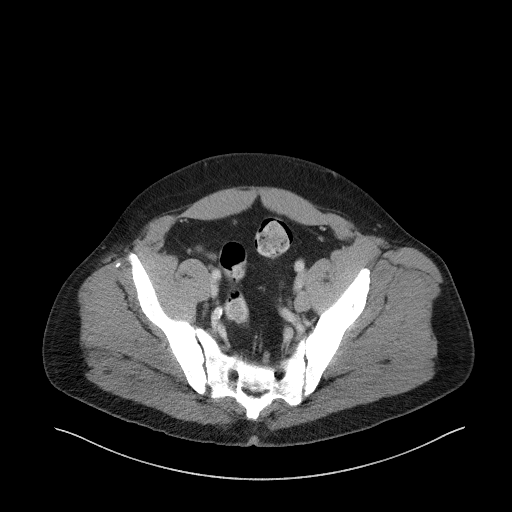
[im 38/103  soft-tissue]
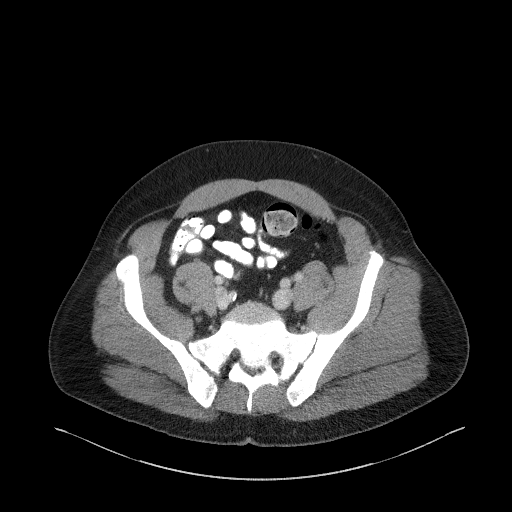
[im 49/103  soft-tissue]
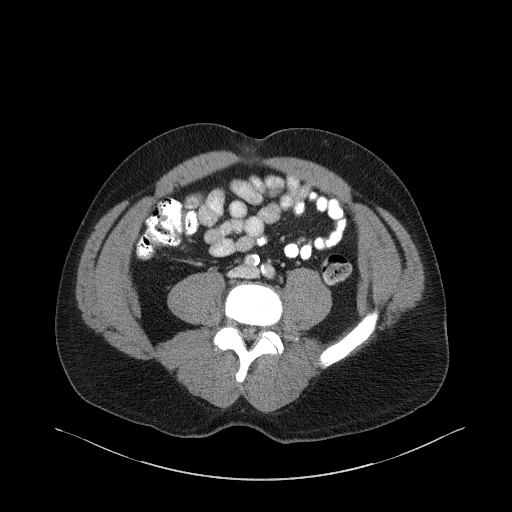
[im 54/103  soft-tissue]
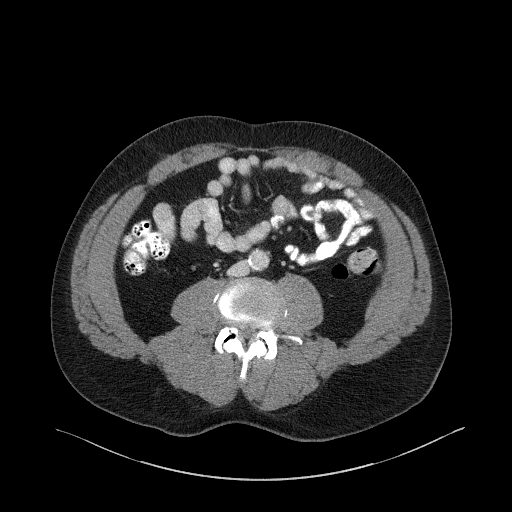
[im 65/103  soft-tissue]
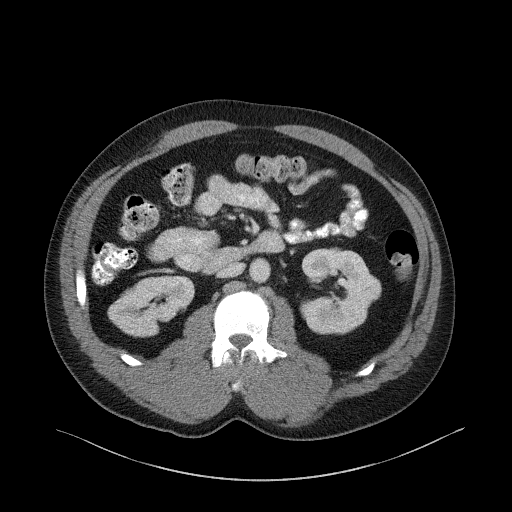
[im 70/103  soft-tissue]
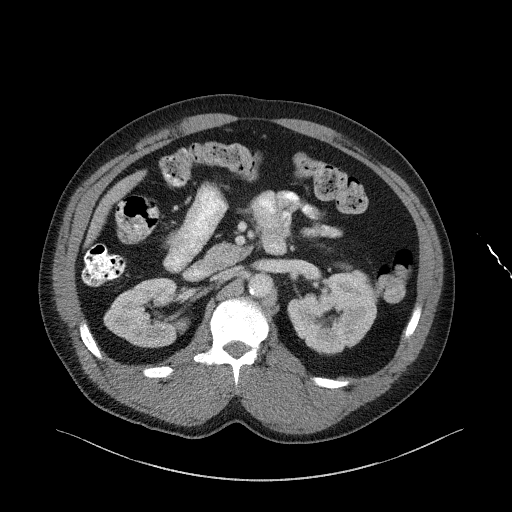
[im 70/103  bone]
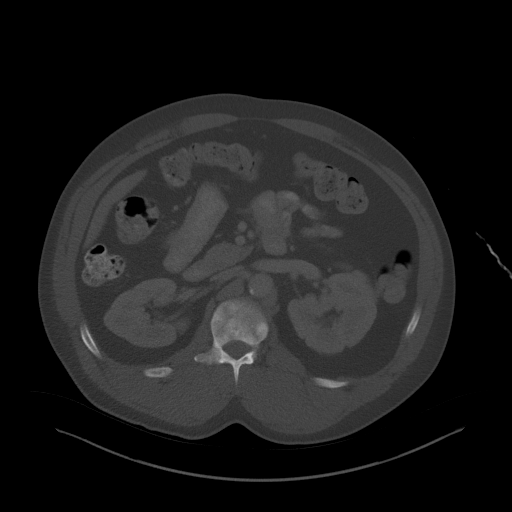
[im 81/103  soft-tissue]
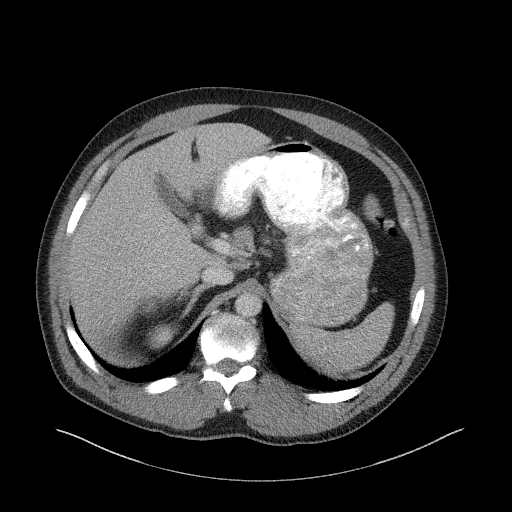
[im 86/103  soft-tissue]
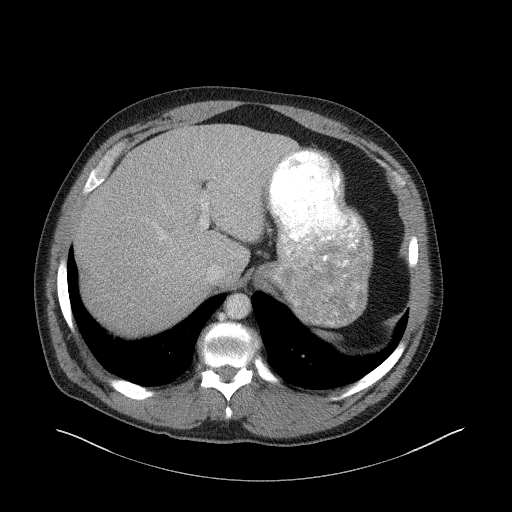
[im 97/103  soft-tissue]
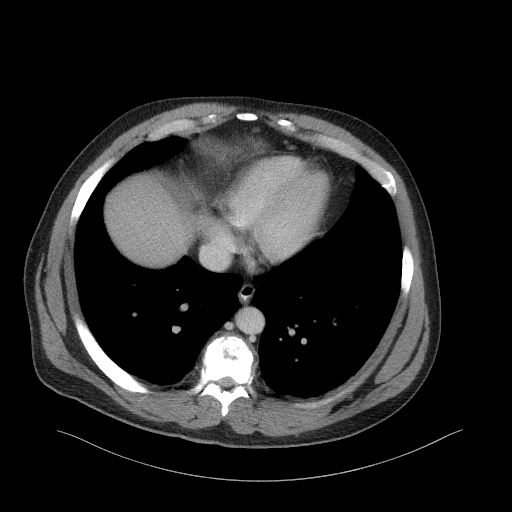

[Series 6: a/p w/ cor · coronal · 0.76mm/px · 3 of 151 slices shown]
[im 51/151  soft-tissue]
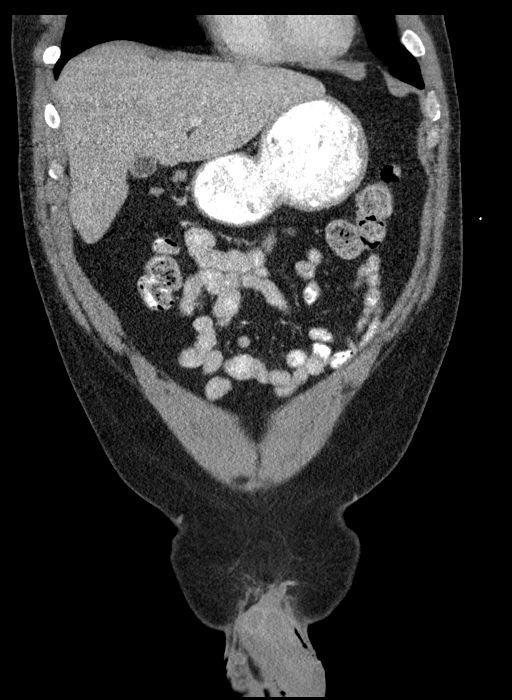
[im 67/151  soft-tissue]
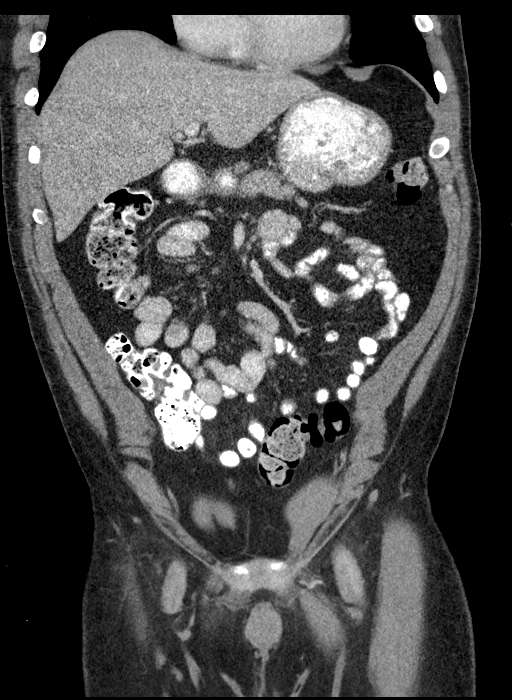
[im 84/151  soft-tissue]
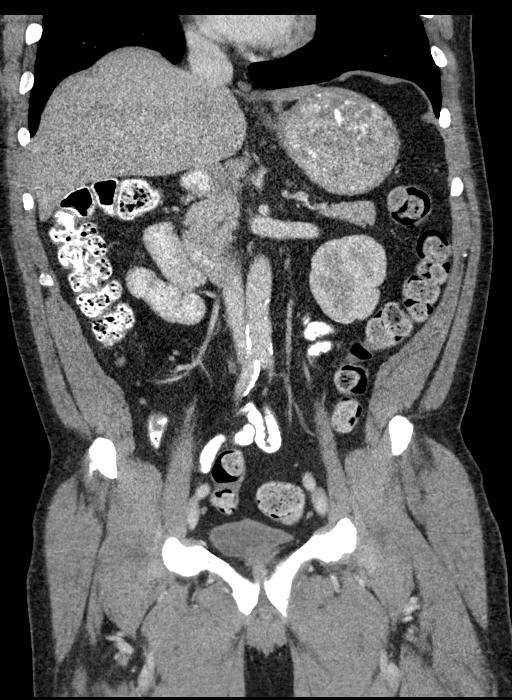

[15 of 46 positions shown; findings below may reference images not displayed]

FINDINGS: Lower chest: No acute abnormality.

Hepatobiliary: Indeterminate low-density structure along the dome of
liver measures 1.6 x 1.0 cm, image [DATE]. No additional focal liver
abnormalities. The gallbladder appears normal. No biliary
dilatation.

Pancreas: Unremarkable. No pancreatic ductal dilatation or
surrounding inflammatory changes.

Spleen: Normal in size without focal abnormality.

Adrenals/Urinary Tract: Right adrenal gland nodule measures 1.6 cm
and 69 HU. Indeterminate. Slightly exophytic intermediate
attenuating structure within the upper pole of right kidney measures
2.3 cm and 26 HU. Bilateral renal cortical lobulation identified. No
hydronephrosis.

Stomach/Bowel: The stomach appears normal. The small bowel loops
have a normal course and caliber. The appendix is visualized and
appears normal. Normal appearance of the colon.

Vascular/Lymphatic: Mild aortic atherosclerosis. No aneurysm. Porta
hepatic lymph node is enlarged measuring 1.7 cm, image [DATE]. Left
external iliac node is enlarged measuring 1.7 cm, image 73/3. Within
the left posterior pelvis there is a enlarged lymph node posterior
to the prostate gland which measures 1.4 cm, image 81/3.

Reproductive: The prostate gland Measures 5.2 by 5.4 by 5.5 cm
(volume = 81 cm^3), image 83/3.

Other: No abdominal wall hernia or abnormality. No abdominopelvic
ascites.

Musculoskeletal: Diffuse, predominantly sclerotic bone metastases
are identified throughout the axial and appendicular skeleton. A few
lytic lesions are also noted including in the right superior pubic
rami. Here, lesion measures 1.8 cm, image 85/3. Pathologic fractures
are identified within the lower thoracic spine, most notably at T8,
T9 and T12. Underlying lytic lesion is identified within the T8
vertebra which measures approximately 1.3 cm.
IMPRESSION: 1. Widespread bone metastases. These are predominantly sclerotic
with a few scattered lytic lesions noted. Pathologic compression
fractures are noted including T8, T9 and T12.
2. Enlarged porta hepatic, left external iliac and left posterior
pelvic lymph nodes compatible with metastatic adenopathy.
3. Prostate gland enlargement.
4. Small right adrenal nodule, indeterminate. This could be more
definitively characterized with adrenal protocol MRI.
5. Indeterminate low-density structure along the dome of liver
measures 1.6 cm. This could also be more definitively characterized
with contrast enhanced MRI of the abdomen.
